# Patient Record
Sex: Female | Born: 1945 | Race: White | Hispanic: No | Smoking: Former smoker
Health system: Southern US, Community
[De-identification: ages and names within clinical notes are randomized; demographics above are authoritative.]

## PROBLEM LIST (undated history)

## (undated) DIAGNOSIS — J449 Chronic obstructive pulmonary disease, unspecified: Secondary | ICD-10-CM

## (undated) DIAGNOSIS — I2510 Atherosclerotic heart disease of native coronary artery without angina pectoris: Secondary | ICD-10-CM

## (undated) DIAGNOSIS — M1990 Unspecified osteoarthritis, unspecified site: Secondary | ICD-10-CM

## (undated) DIAGNOSIS — I10 Essential (primary) hypertension: Secondary | ICD-10-CM

## (undated) HISTORY — PX: TUBAL LIGATION: SHX77

## (undated) HISTORY — PX: ABDOMINAL HYSTERECTOMY: SHX81

## (undated) HISTORY — PX: TOTAL KNEE ARTHROPLASTY: SHX125

## (undated) HISTORY — PX: TONSILLECTOMY: SUR1361

## (undated) HISTORY — PX: BACK SURGERY: SHX140

## (undated) HISTORY — PX: FOOT SURGERY: SHX648

## (undated) HISTORY — PX: CHOLECYSTECTOMY: SHX55

---

## 1998-05-20 ENCOUNTER — Ambulatory Visit (HOSPITAL_COMMUNITY): Admission: RE | Admit: 1998-05-20 | Discharge: 1998-05-20 | Payer: Self-pay | Admitting: Cardiology

## 1998-09-04 ENCOUNTER — Other Ambulatory Visit: Admission: RE | Admit: 1998-09-04 | Discharge: 1998-09-04 | Payer: Self-pay | Admitting: Obstetrics and Gynecology

## 1998-11-10 ENCOUNTER — Ambulatory Visit (HOSPITAL_COMMUNITY): Admission: RE | Admit: 1998-11-10 | Discharge: 1998-11-10 | Payer: Self-pay | Admitting: Gastroenterology

## 2001-03-13 ENCOUNTER — Other Ambulatory Visit: Admission: RE | Admit: 2001-03-13 | Discharge: 2001-03-13 | Payer: Self-pay | Admitting: Obstetrics and Gynecology

## 2001-05-24 ENCOUNTER — Ambulatory Visit (HOSPITAL_COMMUNITY): Admission: RE | Admit: 2001-05-24 | Discharge: 2001-05-24 | Payer: Self-pay | Admitting: Unknown Physician Specialty

## 2001-06-08 ENCOUNTER — Encounter: Admission: RE | Admit: 2001-06-08 | Discharge: 2001-06-08 | Payer: Self-pay | Admitting: Neurosurgery

## 2001-06-08 ENCOUNTER — Encounter: Payer: Self-pay | Admitting: Neurosurgery

## 2001-07-02 ENCOUNTER — Encounter: Payer: Self-pay | Admitting: Neurosurgery

## 2001-07-02 ENCOUNTER — Encounter: Admission: RE | Admit: 2001-07-02 | Discharge: 2001-07-02 | Payer: Self-pay | Admitting: Neurosurgery

## 2001-07-20 ENCOUNTER — Encounter: Payer: Self-pay | Admitting: Neurosurgery

## 2001-07-20 ENCOUNTER — Encounter: Admission: RE | Admit: 2001-07-20 | Discharge: 2001-07-20 | Payer: Self-pay | Admitting: Neurosurgery

## 2001-07-30 ENCOUNTER — Inpatient Hospital Stay (HOSPITAL_COMMUNITY): Admission: EM | Admit: 2001-07-30 | Discharge: 2001-08-02 | Payer: Self-pay | Admitting: Emergency Medicine

## 2001-07-30 ENCOUNTER — Encounter: Payer: Self-pay | Admitting: Emergency Medicine

## 2001-07-31 ENCOUNTER — Encounter: Payer: Self-pay | Admitting: Cardiology

## 2001-08-14 ENCOUNTER — Encounter: Payer: Self-pay | Admitting: *Deleted

## 2001-08-14 ENCOUNTER — Emergency Department (HOSPITAL_COMMUNITY): Admission: EM | Admit: 2001-08-14 | Discharge: 2001-08-14 | Payer: Self-pay | Admitting: Emergency Medicine

## 2001-08-27 ENCOUNTER — Inpatient Hospital Stay (HOSPITAL_COMMUNITY): Admission: EM | Admit: 2001-08-27 | Discharge: 2001-08-31 | Payer: Self-pay

## 2002-03-21 ENCOUNTER — Other Ambulatory Visit: Admission: RE | Admit: 2002-03-21 | Discharge: 2002-03-21 | Payer: Self-pay | Admitting: Obstetrics and Gynecology

## 2003-04-17 ENCOUNTER — Other Ambulatory Visit: Admission: RE | Admit: 2003-04-17 | Discharge: 2003-04-17 | Payer: Self-pay | Admitting: Obstetrics and Gynecology

## 2004-02-27 ENCOUNTER — Ambulatory Visit (HOSPITAL_COMMUNITY): Admission: RE | Admit: 2004-02-27 | Discharge: 2004-02-27 | Payer: Self-pay | Admitting: Gastroenterology

## 2004-05-26 ENCOUNTER — Other Ambulatory Visit: Admission: RE | Admit: 2004-05-26 | Discharge: 2004-05-26 | Payer: Self-pay | Admitting: Obstetrics and Gynecology

## 2004-11-08 ENCOUNTER — Ambulatory Visit: Payer: Self-pay | Admitting: Family Medicine

## 2004-11-24 ENCOUNTER — Ambulatory Visit: Payer: Self-pay | Admitting: Family Medicine

## 2004-11-24 ENCOUNTER — Ambulatory Visit (HOSPITAL_COMMUNITY): Admission: RE | Admit: 2004-11-24 | Discharge: 2004-11-24 | Payer: Self-pay | Admitting: Family Medicine

## 2004-12-31 ENCOUNTER — Ambulatory Visit: Payer: Self-pay | Admitting: Family Medicine

## 2005-01-19 ENCOUNTER — Ambulatory Visit: Payer: Self-pay | Admitting: Family Medicine

## 2005-02-24 ENCOUNTER — Ambulatory Visit: Payer: Self-pay | Admitting: Family Medicine

## 2005-04-10 ENCOUNTER — Emergency Department (HOSPITAL_COMMUNITY): Admission: EM | Admit: 2005-04-10 | Discharge: 2005-04-10 | Payer: Self-pay | Admitting: Emergency Medicine

## 2005-04-12 ENCOUNTER — Ambulatory Visit: Payer: Self-pay | Admitting: Family Medicine

## 2005-04-13 ENCOUNTER — Ambulatory Visit (HOSPITAL_COMMUNITY): Admission: RE | Admit: 2005-04-13 | Discharge: 2005-04-13 | Payer: Self-pay | Admitting: Family Medicine

## 2005-05-03 ENCOUNTER — Inpatient Hospital Stay (HOSPITAL_COMMUNITY): Admission: RE | Admit: 2005-05-03 | Discharge: 2005-05-10 | Payer: Self-pay | Admitting: Neurosurgery

## 2005-05-03 ENCOUNTER — Ambulatory Visit: Payer: Self-pay | Admitting: Physical Medicine & Rehabilitation

## 2005-05-31 ENCOUNTER — Ambulatory Visit: Payer: Self-pay | Admitting: Family Medicine

## 2005-09-26 ENCOUNTER — Ambulatory Visit: Payer: Self-pay | Admitting: Family Medicine

## 2005-09-29 ENCOUNTER — Ambulatory Visit: Payer: Self-pay | Admitting: Family Medicine

## 2005-10-11 ENCOUNTER — Ambulatory Visit: Payer: Self-pay | Admitting: Family Medicine

## 2005-10-20 ENCOUNTER — Ambulatory Visit: Payer: Self-pay | Admitting: Family Medicine

## 2005-10-20 ENCOUNTER — Ambulatory Visit (HOSPITAL_COMMUNITY): Admission: RE | Admit: 2005-10-20 | Discharge: 2005-10-20 | Payer: Self-pay | Admitting: Family Medicine

## 2006-02-16 ENCOUNTER — Ambulatory Visit: Payer: Self-pay | Admitting: Family Medicine

## 2006-02-23 ENCOUNTER — Ambulatory Visit: Payer: Self-pay | Admitting: Family Medicine

## 2006-04-14 ENCOUNTER — Ambulatory Visit: Payer: Self-pay | Admitting: Family Medicine

## 2006-05-08 ENCOUNTER — Ambulatory Visit: Payer: Self-pay | Admitting: Family Medicine

## 2006-05-11 ENCOUNTER — Ambulatory Visit: Payer: Self-pay | Admitting: Family Medicine

## 2006-05-19 ENCOUNTER — Ambulatory Visit: Payer: Self-pay | Admitting: Family Medicine

## 2006-08-22 ENCOUNTER — Ambulatory Visit: Payer: Self-pay | Admitting: Family Medicine

## 2006-11-03 ENCOUNTER — Ambulatory Visit: Payer: Self-pay | Admitting: Family Medicine

## 2006-12-05 ENCOUNTER — Ambulatory Visit: Payer: Self-pay | Admitting: Family Medicine

## 2007-02-09 ENCOUNTER — Ambulatory Visit: Payer: Self-pay | Admitting: Family Medicine

## 2007-03-07 ENCOUNTER — Ambulatory Visit: Payer: Self-pay | Admitting: Family Medicine

## 2007-04-04 ENCOUNTER — Ambulatory Visit: Payer: Self-pay | Admitting: Family Medicine

## 2007-04-18 ENCOUNTER — Ambulatory Visit: Payer: Self-pay | Admitting: Family Medicine

## 2007-05-07 ENCOUNTER — Inpatient Hospital Stay (HOSPITAL_COMMUNITY): Admission: RE | Admit: 2007-05-07 | Discharge: 2007-05-09 | Payer: Self-pay | Admitting: Orthopedic Surgery

## 2007-05-30 ENCOUNTER — Encounter: Admission: RE | Admit: 2007-05-30 | Discharge: 2007-06-13 | Payer: Self-pay | Admitting: Orthopedic Surgery

## 2008-01-14 ENCOUNTER — Ambulatory Visit (HOSPITAL_COMMUNITY): Admission: RE | Admit: 2008-01-14 | Discharge: 2008-01-14 | Payer: Self-pay | Admitting: Neurosurgery

## 2008-04-22 ENCOUNTER — Inpatient Hospital Stay (HOSPITAL_COMMUNITY): Admission: RE | Admit: 2008-04-22 | Discharge: 2008-05-02 | Payer: Self-pay | Admitting: Neurosurgery

## 2008-05-14 ENCOUNTER — Ambulatory Visit (HOSPITAL_COMMUNITY): Admission: RE | Admit: 2008-05-14 | Discharge: 2008-05-14 | Payer: Self-pay | Admitting: Neurosurgery

## 2008-06-09 ENCOUNTER — Encounter (HOSPITAL_COMMUNITY): Admission: RE | Admit: 2008-06-09 | Discharge: 2008-07-09 | Payer: Self-pay | Admitting: Neurosurgery

## 2008-06-17 ENCOUNTER — Encounter: Admission: RE | Admit: 2008-06-17 | Discharge: 2008-06-17 | Payer: Self-pay | Admitting: Neurosurgery

## 2008-07-03 ENCOUNTER — Encounter: Admission: RE | Admit: 2008-07-03 | Discharge: 2008-07-03 | Payer: Self-pay | Admitting: Neurosurgery

## 2008-07-11 ENCOUNTER — Encounter (HOSPITAL_COMMUNITY): Admission: RE | Admit: 2008-07-11 | Discharge: 2008-08-10 | Payer: Self-pay | Admitting: Neurosurgery

## 2008-07-22 ENCOUNTER — Encounter: Admission: RE | Admit: 2008-07-22 | Discharge: 2008-07-22 | Payer: Self-pay | Admitting: Neurosurgery

## 2008-10-01 ENCOUNTER — Encounter: Admission: RE | Admit: 2008-10-01 | Discharge: 2008-10-01 | Payer: Self-pay | Admitting: Neurosurgery

## 2009-01-27 ENCOUNTER — Encounter: Admission: RE | Admit: 2009-01-27 | Discharge: 2009-01-27 | Payer: Self-pay | Admitting: Neurosurgery

## 2009-02-10 ENCOUNTER — Encounter: Admission: RE | Admit: 2009-02-10 | Discharge: 2009-02-10 | Payer: Self-pay | Admitting: Neurosurgery

## 2009-02-16 ENCOUNTER — Ambulatory Visit (HOSPITAL_COMMUNITY): Admission: RE | Admit: 2009-02-16 | Discharge: 2009-02-16 | Payer: Self-pay | Admitting: Ophthalmology

## 2009-05-04 ENCOUNTER — Ambulatory Visit (HOSPITAL_COMMUNITY): Admission: RE | Admit: 2009-05-04 | Discharge: 2009-05-04 | Payer: Self-pay | Admitting: Ophthalmology

## 2009-10-30 ENCOUNTER — Encounter: Admission: RE | Admit: 2009-10-30 | Discharge: 2009-10-30 | Payer: Self-pay | Admitting: Neurosurgery

## 2010-11-15 ENCOUNTER — Encounter: Admission: RE | Admit: 2010-11-15 | Discharge: 2010-11-15 | Payer: Self-pay | Admitting: Neurosurgery

## 2010-12-15 ENCOUNTER — Encounter
Admission: RE | Admit: 2010-12-15 | Discharge: 2010-12-15 | Payer: Self-pay | Source: Home / Self Care | Attending: Neurosurgery | Admitting: Neurosurgery

## 2010-12-19 HISTORY — PX: CARDIAC CATHETERIZATION: SHX172

## 2010-12-30 ENCOUNTER — Encounter
Admission: RE | Admit: 2010-12-30 | Discharge: 2010-12-30 | Payer: Self-pay | Source: Home / Self Care | Attending: Neurosurgery | Admitting: Neurosurgery

## 2011-03-29 LAB — GLUCOSE, CAPILLARY

## 2011-03-29 LAB — BASIC METABOLIC PANEL
Chloride: 99 mEq/L (ref 96–112)
Creatinine, Ser: 0.71 mg/dL (ref 0.4–1.2)
GFR calc Af Amer: >60 mL/min (ref >60)
Potassium: 4.6 mEq/L (ref 3.5–5.1)
Sodium: 136 mEq/L (ref 135–145)

## 2011-03-29 LAB — HEMOGLOBIN AND HEMATOCRIT, BLOOD
HCT: 34 % — ABNORMAL LOW (ref 36.0–46.0)
Hemoglobin: 11.7 g/dL — ABNORMAL LOW (ref 12.0–15.0)

## 2011-03-31 LAB — GLUCOSE, CAPILLARY: Glucose-Capillary: 150 mg/dL — ABNORMAL HIGH (ref 70–99)

## 2011-04-05 LAB — BASIC METABOLIC PANEL
Calcium: 9.5 mg/dL (ref 8.4–10.5)
GFR calc non Af Amer: >60 mL/min (ref >60)
Glucose, Bld: 258 mg/dL — ABNORMAL HIGH (ref 70–99)
Sodium: 138 mEq/L (ref 135–145)

## 2011-05-03 NOTE — H&P (Signed)
NAME:  Angelica [name], Angelica [name] NO.:  [id number]   MEDICAL RECORD NO.:  [id number]          PATIENT TYPE:  INP   LOCATION:  3172                         FACILITY:  [facility]   PHYSICIAN:  Hilda Lias, M.D.   DATE OF BIRTH:  Jan 23, 1946   DATE OF ADMISSION:  04/22/2008  DATE OF DISCHARGE:                              HISTORY & PHYSICAL   HISTORY:  Ms. Wohlfarth is a lady who had been complaining of back pain  radiating to the lower extremities which has been going on for several  months.  The patient is getting worse.  The patient had conservative  treatment including epidural injection without any improvement.  She,  about 3 years ago, underwent fusion of L5-S1.  Right now, the pain is  getting up to the point that she has difficulty driving.   PAST MEDICAL HISTORY:  Hysterectomy, cholecystectomy, tubal ligation,  and L5-S1 fusion.   SOCIAL HISTORY:  The patient smokes and drinks.   FAMILY HISTORY:  Unremarkable.   REVIEW OF SYSTEMS:  Positive for low back pain, bilateral leg pain, left  worse than the right.   PHYSICAL EXAMINATION:  HEAD, EARS, NOSE, AND THROAT: Normal.  NECK: Normal.  LUNGS: Clear.  HEART: Normal.  ABDOMEN: Normal.  Carotids, normal pulses.  MUSCULOSKELETAL: She has a well-healed scar in the lumbar area.  She has  a decreased flexion of the lumbar spine.  Straight leg raising is  positive bilaterally about 60 degrees. The x-rays show fusion of L5-S1.  The myelogram showed that she has stenosis at the L4-L5 with a grade 1  spondylolisthesis.   IMPRESSION:  L4-S1 spondylolisthesis with stenosis.  Status post fusion  of L5-S1.   RECOMMENDATIONS:  The patient being admitted for surgery.  Procedure  will be bilateral laminectomy at L4, foraminotomy with cages and pedicle  screws.  The surgery was fully explained to her and essentially the same  one she had 3-1/2 years ago.  The risk of course are infection, CSF  leak, no improvement whatsoever, and  need of further surgery.           ______________________________  Hilda Lias, M.D.     EB/MEDQ  D:  04/22/2008  T:  04/22/2008  Job:  [id number]

## 2011-05-03 NOTE — Discharge Summary (Signed)
NAME:  Angelica [name], Angelica [name] NO.:  [id number]   MEDICAL RECORD NO.:  [id number]          PATIENT TYPE:  INP   LOCATION:  3035                         FACILITY:  [facility]   PHYSICIAN:  Hilda Lias, M.D.   DATE OF BIRTH:  Aug 28, 1946   DATE OF ADMISSION:  04/22/2008  DATE OF DISCHARGE:  05/02/2008                               DISCHARGE SUMMARY   ADMISSION DIAGNOSES:  Lumbar stenosis L4-L5 with bilateral  radiculopathy, neurogenic claudication, status post fusion L5-S1.   FINAL DIAGNOSES:  Lumbar stenosis L4-L5 with bilateral radiculopathy,  neurogenic claudication, status post fusion L5-S1.   CLINICAL HISTORY:  The patient was admitted because of back pain  radiating to both legs.  The patient is quite obese.  Previously, she  has had a fusion of L5-S1 and she did well.  This one showed that she  had stenosis and surgery was advised.  Laboratory normal.   COURSE IN THE HOSPITAL:  The patient was taken to the surgery and a L4-  L5 diskectomy and fusion was done.  The patient did well, but later on,  she started complaining of off and on some burning pain going to the  left leg, not to the right one.  Medication did not help.  We did an x-  ray of the lumbar spine, which was normal and a CT scan showed that  there is a possibility of some fragmented bone into the foramina.  We  gave the choice to the patient about waiting or to proceed with surgery.  At the end, we agreed with surgery and we took her to surgery.  We did  not find anything major except inflammation of the L5 nerve root.  The  canal was open and there was no any nerve root or any other bone  compromising the nerve root.  Today, she is feeling better, although she  tells the pain is more tolerable than prior to surgery.  She is going to  be sent home today to follow up by me in my office.   CONDITION ON DISCHARGE:  Improving.   MEDICATIONS:  1. Percocet.  2. Diazepam.  3. Cortisone.  4. Neurontin.   DIET:  She will continue her diabetic diet and try to lose weight.   ACTIVITY:  Not to the right for at least two weeks.   FOLLOWUP:  She will be seen by me in 4 weeks.           ______________________________  Hilda Lias, M.D.    EB/MEDQ  D:  05/02/2008  T:  05/02/2008  Job:  [id number]

## 2011-05-03 NOTE — Op Note (Signed)
NAME:  Angelica [name], Angelica [name] NO.:  [id number]   MEDICAL RECORD NO.:  [id number]          PATIENT TYPE:  INP   LOCATION:  3035                         FACILITY:  [facility]   PHYSICIAN:  Hilda Lias, M.D.   DATE OF BIRTH:  1946-02-13   DATE OF PROCEDURE:  04/22/2008  DATE OF DISCHARGE:                               OPERATIVE REPORT   PREOPERATIVE DIAGNOSES:  Lumbar stenosis L4-L5 with bilateral  radiculopathy, neurogenic claudication, status post fusion L5-S1.   POSTOPERATIVE DIAGNOSES:  Lumbar stenosis L4-L5 with bilateral  radiculopathy, neurogenic claudication, status post fusion L5-S1.   PROCEDURES:  1. Bilateral L4 laminectomy and facetectomy.  2. Bilateral total gross L4-L5 diskectomy.  3. Interbody fusion with cages.  4. Pedicle screws L4-L5.  5. Posterolateral arthrodesis with BMP and Vitoss.   SURGEON:  Hilda Lias, MD   CLINICAL HISTORY:  Ms. Arambula is a lady who in the past underwent fusion  of the L5-S1.  The patient did well, but lately she had been complaining  of back pain radiating to both legs, left worse than the right.  X-rays  showed that she had severe stenosis at the level of L4-L5.  The pedicle  screws at the level of S1 on the left side was broken.  Nevertheless,  flexion extension showed good alignment.  Surgery was advised.   PROCEDURE:  The patient was taken to the OR and she was positioned in a  prone manner. Skin was prepped with DuraPrep.  Drapes were applied, and  midline incision from the previous one was made.  We went through a  thick adipose tissue layer down to the muscle.  We identified the  pedicles of L5 and S1 and retraction was made laterally.  Then, the caps  from the screws were removed as well as the rod.  We left in place the  pedicle of L5 removing the S1 on the right side and the bulk of the one  on the left side.  Nevertheless, we tried to attempt to remove the rest  of the screw, but we were afraid there would be  the risk for damage, and  since there was no communication whatsoever with changes in the canal of  the foramen, it was left in place.  Then, we proceeded with the removal  of spinous process at L4 as well as the lamina and the facet.  The  patient had quite a bit of adhesions, and lysis was accomplished.  We  retracted the thecal sac, and we got into the disk space.  Bilateral  total gross diskectomy was achieved.  Then, two cages of 10 x 22 with  autograft and BMP were introduced.  This was followed using two screws  at the level of L4 using the C-arm.  Good position of the screw was  achieved, and then connection between the L4 and L5 screw was done using  a rod and caps.  Compression was done to prevent any migration of the  cages.  Then, we went laterally, and we drilled the lateral aspect of  the facet L4-L5 as well as  the proximal transverse  process.  A mix of autograft and BMP as well as Vitoss was used for  arthrodesis.  We investigated the foramen.  There was thin interspace of  the L4 and L5 nerve root.  Valsalva maneuver was negative.  Then, the  wound was closed with Vicryl and Steri-Strips.           ______________________________  Hilda Lias, M.D.     EB/MEDQ  D:  04/22/2008  T:  04/23/2008  Job:  [id number]

## 2011-05-06 NOTE — Consult Note (Signed)
NAME:  Angelica [name], Angelica [name] NO.:  [id number]   MEDICAL RECORD NO.:  [id number]          PATIENT TYPE:  INP   LOCATION:  3011                         FACILITY:  [facility]   PHYSICIAN:  Altha Harm, MDDATE OF BIRTH:  31-Oct-1946   DATE OF CONSULTATION:  05/06/2005  DATE OF DISCHARGE:                                   CONSULTATION   CHIEF COMPLAINT:  Consult for management of diabetes and asthma.   HISTORY OF PRESENT ILLNESS:  This is a 65 year old Caucasian female who is  status post bilateral L5 laminectomy, fasciotomy, and total diskectomy on  Apr 28, 2005.  Patient has a long-standing history of hypertension, asthma,  obesity, and recent diagnosis of diabetes type 2.  Patient denies any visual  changes.  She denies any chest pain.  She denies any headaches, any  dysesthesias.  Patient only complains of back pain which she states varies  between 3-7/10 depending on coincidence with her pain medications.   PAST MEDICAL HISTORY:  1.  Diabetes type 2.  2.  Hypertension.  3.  Asthma.  4.  Obesity.  5.  Osteoarthritis.  6.  Hypercholesterolemia.  7.  Rule out history of thrombocytopenia associated with quinine use.   PAST SURGICAL HISTORY:  Negative except for the surgery on this admission.   SOCIAL HISTORY:  Patient smokes two packs per day x30 years.  She is a  Runner, broadcasting/film/video.  She denies any alcohol or drug use.   FAMILY HISTORY:  Significant for coronary artery disease and lung cancer.   ALLERGIES:  ROCEPHIN, PENICILLIN which causes a rash, MORPHINE which causes  a rash, allergy unknown.   CURRENT MEDICATIONS AT HOME:  1.  Percocet 5/325 q.4h. p.r.n.  2.  Diovan 80 mg daily.  3.  Amaryl 3 mg daily.  4.  Celebrex 200 mg daily.  5.  Singulair 10 mg daily.  6.  Neurontin 300 mg t.i.d.  7.  Triglide 160 mg daily.  8.  Nabumetone 1000 mg h.s.  9.  Nexium 40 mg daily.  10. Triamterene/hydrochlorothiazide 37.5/25 daily.  11. Premarin 7.25 mg daily.  12.  Albuterol inhaler two puffs q.4h. p.r.n.  13. Advair 250/50 one puff b.i.d.   REVIEW OF SYSTEMS:  All systems are negative except as noted in the HPI.   LABORATORIES:  May 11:  Sodium 133, potassium 3.3, chloride 96, bicarbonate  29, BUN 9, creatinine 0.9.  Patient had a white blood cell count of 6.6,  hemoglobin 13, hematocrit 37.9, platelets 535.   PHYSICAL EXAMINATION:  GENERAL:  This patient is lying in bed somewhat  confused secondary to her pain medication use which was recently  administered to her.  Patient wanes between sleep and awake.  Her daughter  is in the room and gave most of the history.  The patient appears to be in  no distress at this time.  Her CBGs recorded is 149 ___________.  No other  CBGs recorded.  VITAL SIGNS:  Temperature 97, heart rate 105, respiratory rate 16, blood  pressure 108/73.  She is 92% on room air.  HEENT:  Patient is normocephalic, atraumatic.  Pupils are equal, round, and  reactive to light and accommodation.  Extraocular movements are intact.  Fundi are benign.  There appears to be no hemorrhages or any other  abnormalities noted.  Tympanic membranes are translucent bilaterally.  Good  landmarks.  External auditory canal shows no discharge and no cerumen.  Nasal mucosa is moist.  No polyps noted.  Oropharynx is moist.  No lesions,  exudate, or erythema is noted.  NECK:  Supple.  Trachea is midline.  No masses.  No thyromegaly noted.  CHEST:  Clear to auscultation.  Patient has normal respiratory effort  without any accessory muscle use.  There is no wheezing or rhonchi noted.  No increased work or fremitus.  CARDIOVASCULAR:  She has a normal S1 and S2.  PMI is nondisplaced.  No  murmurs, rubs, or gallops are noted.  No heaves or thrills on palpation.  ABDOMEN:  Obese, soft, nontender, nondistended.  No masses.  No  hepatosplenomegaly.  She has normoactive bowel sounds.  LYMPHATIC:  She has no cervical, axillary, inguinal lymphadenopathy.   MUSCULOSKELETAL:  Patient moves both extremities above gravity.  Patient is  able to ambulate from the bed to the bathroom with standby assistance only.  However, patient refused formal musculoskeletal examination of strength.  PSYCHIATRIC:  Patient appears to have normal affect.  When patient is awake  she is able to perform serial 7's without any difficulty and give history.  However, the patient falls off to sleep very quickly.  NEUROLOGIC:  Examination deferred to neurosurgery.   ASSESSMENT/PLAN:  1.  History of diabetes type 2.  I will resume the patient's Amaryl and      check her blood sugars.  I will also check a hemoglobin A1C on this      patient at this time.  2.  Hypertension.  It appears to be stable.  Will resume her Diovan and      triamterene/hydrochlorothiazide.  3.  Hypercholesterolemia.  Resume her Triglide.  4.  History of asthma.  Patient will be placed on Singulair and Advair.  At      this time there is no need for albuterol as the patient appears to have      no acute exacerbation of her symptoms.  5.  History of gastroesophageal reflux disease.  She will be placed on her      Nexium.  6.  In terms of neuropathic pain, currently, the patient does not have any      neuropathic pain and this may have been      remedied with her surgery.  I will defer for restarting of gabapentin to      her neurosurgeon.  We will be happy to follow along with the care of      this patient.   Thank you for referring this patient.      MAM/MEDQ  D:  05/06/2005  T:  05/07/2005  Job:  [id number]

## 2011-05-06 NOTE — Cardiovascular Report (Signed)
[place]. [facility]  Patient:    Angelica [name], Angelica [name]                      MRN: [id number] Proc. Date: 08/01/01 Adm. Date:  [id number] Attending:  Eleanora Neighbor CC:         Dr. Dewaine Conger, [place]   Cardiac Catheterization  HISTORY:  The patient is a 65 year old female with a history of obesity, positive family history of heart disease, diabetes, hypertension, cigarette abuse, and hypercholesterolemia, and also a history of asthma.  She presents with substernal chest pain.  Initially myocardial infarction was ruled out. She then developed a fever.  Cultures were negative.  She was started on antibiotics.  On the day of the procedure, she developed a rash.  Because of the need to further differentiate etiology of the chest pain syndrome that she has, as well as multiple risk factors, she was referred for catheterization. She did develop loose stools on the day before the procedure.  PROCEDURE:  Left heart catheterization with selective coronary angiography, left ventricular angiography.  TYPE AND SITE OF ENTRY:  Percutaneous right femoral artery (femoral arteriograms demonstrated the right femoral profunda artery as the entry site).  MEDICATIONS GIVEN PRIOR TO THE PROCEDURE:  Valium 10 mg p.o.  MEDICATIONS GIVEN DURING THE PROCEDURE:  Versed 2 mg IV, Benadryl 25 mg IV.  COMMENTS:  The patient tolerated the procedure well.  HEMODYNAMIC DATA:  The aortic pressure was 100/20,  LV was 99/40.  There was no aortic valve gradient noted on pullback.  ANGIOGRAPHIC DATA: 1. Left main coronary artery:  Normal. 2. Left anterior descending:  Left anterior descending has a high diagonal    vessel.  It extends to the apex.  There is 30-40% narrowing in the mid    LAD.  There is some tortuosity of the vessel at the distal two thirds. 3. Left circumflex:  The left circumflex is a large obtuse marginal.  It is    essentially normal. 4. Right coronary artery:  The  right coronary artery is a moderately    large dominant vessel.  There is 30% narrowing in the midportion of the    vessel.  There is excellent distal flow.  There is some scattered    irregularities more proximal to this narrowing but distal vessels are    all satisfactory.  LEFT VENTRICULOGRAPHY:  Left ventricular angiogram was performed in the RAO position.  Overall cardiac size and silhouette were normal.  Left ventricular function is normal.  Femoral arteriogram demonstrates the entry site into the profunda femoris.  OVERALL IMPRESSION: 1. Essentially normal left ventricular function. 2. Mild two-vessel coronary atherosclerosis.  DISCUSSION:  It is felt that the patients current problem is not related to ischemic heart disease.  She has a multitude of cardiovascular risk factors which need to be modified for her to do well from a cardiovascular standpoint. D:  08/01/01 TD:  08/01/01 Job: [id number]

## 2011-09-13 LAB — BASIC METABOLIC PANEL
CO2: 28
Calcium: 9.7
Creatinine, Ser: 0.58
GFR calc Af Amer: >60
Glucose, Bld: 141 — ABNORMAL HIGH

## 2011-09-13 LAB — CBC
MCHC: 33.9
RBC: 4.45
RDW: 13.4

## 2011-09-13 LAB — TYPE AND SCREEN
ABO/RH(D): O POS
Antibody Screen: NEGATIVE

## 2011-11-27 ENCOUNTER — Emergency Department (HOSPITAL_COMMUNITY)
Admission: EM | Admit: 2011-11-27 | Discharge: 2011-11-28 | Disposition: A | Discharge status: Home / Self Care | Payer: Medicare Other | Attending: Emergency Medicine | Admitting: Emergency Medicine

## 2011-11-27 ENCOUNTER — Emergency Department (HOSPITAL_COMMUNITY): Payer: Medicare Other

## 2011-11-27 DIAGNOSIS — J40 Bronchitis, not specified as acute or chronic: Secondary | ICD-10-CM

## 2011-11-27 DIAGNOSIS — J4489 Other specified chronic obstructive pulmonary disease: Secondary | ICD-10-CM | POA: Insufficient documentation

## 2011-11-27 DIAGNOSIS — B349 Viral infection, unspecified: Secondary | ICD-10-CM

## 2011-11-27 DIAGNOSIS — J449 Chronic obstructive pulmonary disease, unspecified: Secondary | ICD-10-CM

## 2011-11-27 DIAGNOSIS — M129 Arthropathy, unspecified: Secondary | ICD-10-CM | POA: Insufficient documentation

## 2011-11-27 DIAGNOSIS — F17200 Nicotine dependence, unspecified, uncomplicated: Secondary | ICD-10-CM | POA: Insufficient documentation

## 2011-11-27 DIAGNOSIS — B9789 Other viral agents as the cause of diseases classified elsewhere: Secondary | ICD-10-CM | POA: Insufficient documentation

## 2011-11-27 DIAGNOSIS — E119 Type 2 diabetes mellitus without complications: Secondary | ICD-10-CM | POA: Insufficient documentation

## 2011-11-27 DIAGNOSIS — I10 Essential (primary) hypertension: Secondary | ICD-10-CM | POA: Insufficient documentation

## 2011-11-27 HISTORY — DX: Essential (primary) hypertension: I10

## 2011-11-27 HISTORY — DX: Chronic obstructive pulmonary disease, unspecified: J44.9

## 2011-11-27 HISTORY — DX: Unspecified osteoarthritis, unspecified site: M19.90

## 2011-11-27 MED ORDER — IPRATROPIUM BROMIDE 0.02 % IN SOLN
RESPIRATORY_TRACT | Status: AC
  Administered 2011-11-27: 0.5 mg
  Filled 2011-11-27: qty 2.5

## 2011-11-27 MED ORDER — SODIUM CHLORIDE 0.9 % IV BOLUS (SEPSIS)
1000.0000 mL | Freq: Once | INTRAVENOUS | Status: AC
  Administered 2011-11-27: 1000 mL via INTRAVENOUS

## 2011-11-27 MED ORDER — PREDNISONE 20 MG PO TABS
60.0000 mg | ORAL_TABLET | Freq: Once | ORAL | Status: AC
Start: 2011-11-27 — End: 2011-11-27
  Administered 2011-11-27: 60 mg via ORAL
  Filled 2011-11-27: qty 3

## 2011-11-27 MED ORDER — ALBUTEROL SULFATE (5 MG/ML) 0.5% IN NEBU
INHALATION_SOLUTION | RESPIRATORY_TRACT | Status: AC
  Administered 2011-11-27: 5 mg
  Filled 2011-11-27: qty 1

## 2011-11-27 NOTE — ED Notes (Signed)
Pt presents with cough, chills, fever, and diarrhea x 1 week. Pt states she was seen by PMD and was diagnosed with bronchitis. Treatments have not helped.

## 2011-11-28 LAB — CBC
MCH: 26.9 pg (ref 26.0–34.0)
MCHC: 31.8 g/dL (ref 30.0–36.0)
Platelets: 358 10*3/uL (ref 150–400)
RDW: 15.1 % (ref 11.5–15.5)

## 2011-11-28 LAB — COMPREHENSIVE METABOLIC PANEL
Albumin: 3.4 g/dL — ABNORMAL LOW (ref 3.5–5.2)
BUN: 13 mg/dL (ref 6–23)
Creatinine, Ser: 0.67 mg/dL (ref 0.50–1.10)
GFR calc Af Amer: >90 mL/min (ref >90)
Total Bilirubin: 0.3 mg/dL (ref 0.3–1.2)
Total Protein: 7.2 g/dL (ref 6.0–8.3)

## 2011-11-28 LAB — URINALYSIS, ROUTINE W REFLEX MICROSCOPIC
Ketones, ur: NEGATIVE mg/dL
Leukocytes, UA: NEGATIVE
Nitrite: NEGATIVE

## 2011-11-28 LAB — URINE MICROSCOPIC-ADD ON

## 2011-11-28 LAB — LIPASE, BLOOD: Lipase: 10 U/L — ABNORMAL LOW (ref 11–59)

## 2011-11-28 MED ORDER — PREDNISONE 10 MG PO TABS: 60.0000 mg | ORAL_TABLET | Freq: Every day | ORAL | Status: DC

## 2011-11-28 MED ORDER — ALBUTEROL SULFATE (5 MG/ML) 0.5% IN NEBU
5.0000 mg | INHALATION_SOLUTION | Freq: Once | RESPIRATORY_TRACT | Status: AC
  Administered 2011-11-28: 5 mg via RESPIRATORY_TRACT
  Filled 2011-11-28: qty 1

## 2011-11-28 MED ORDER — IPRATROPIUM BROMIDE 0.02 % IN SOLN
0.5000 mg | Freq: Once | RESPIRATORY_TRACT | Status: AC
  Administered 2011-11-28: 0.5 mg via RESPIRATORY_TRACT
  Filled 2011-11-28: qty 2.5

## 2011-11-28 NOTE — ED Provider Notes (Signed)
History     CSN: [id number] Arrival date & time: 11/27/2011 10:19 PM   First MD Initiated Contact with Patient 11/27/11 2307      Chief Complaint  Patient presents with  . Cough  . Chills  . Diarrhea  . Nausea     HPI Patient reports approximately one week of bronchitis coughing congestion not improved by azithromycin.  She also reports development of chills fever and diarrhea.  She reports no improvement with her albuterol at home.  Her symptoms are not worsened by anything they're not improved by anything.  She denies chest pain and abdominal pain.  Her symptoms are constant.  They're mild to moderate.  She received albuterol and Atrovent on arrival of emergency department reports that her breathing is much better at this time.  She denies nausea and vomiting  Past Medical History  Diagnosis Date  . Diabetes mellitus   . Hypertension   . COPD (chronic obstructive pulmonary disease)   . Asthma   . Arthritis     Past Surgical History  Procedure Date  . Cesarean section   . Abdominal hysterectomy   . Total knee arthroplasty   . Back surgery   . Tonsillectomy   . Foot surgery   . Tubal ligation   . Cholecystectomy     History reviewed. No pertinent family history.  History  Substance Use Topics  . Smoking status: Current Everyday Smoker -- 1.0 packs/day    Types: Cigarettes  . Smokeless tobacco: Not on file  . Alcohol Use: No    OB History    Grav Para Term Preterm Abortions TAB SAB Ect Mult Living                  Review of Systems  All other systems reviewed and are negative.    Allergies  Quinine derivatives; Morphine and related; and Rocephin  Home Medications  No current outpatient prescriptions on file.  BP 159/71  Pulse 97  Temp(Src) 98.3 F (36.8 C) (Oral)  Resp 20  Ht 5\' 1"  (1.549 m)  Wt 198 lb (89.812 kg)  BMI 37.41 kg/m2  SpO2 96%  Physical Exam  Nursing note and vitals reviewed. Constitutional: She is oriented to person, place,  and time. She appears well-developed and well-nourished. No distress.  HENT:  Head: Normocephalic and atraumatic.  Eyes: EOM are normal.  Neck: Normal range of motion.  Cardiovascular: Normal rate, regular rhythm and normal heart sounds.   Pulmonary/Chest: Effort normal. She has wheezes.  Abdominal: Soft. She exhibits no distension. There is no tenderness.  Musculoskeletal: Normal range of motion.  Neurological: She is alert and oriented to person, place, and time.  Skin: Skin is warm and dry.  Psychiatric: She has a normal mood and affect. Judgment normal.    ED Course  Procedures (including critical care time)  Labs Reviewed  CBC - Abnormal; Notable for the following:    Hemoglobin 11.1 (*)    HCT 34.9 (*)    All other components within normal limits  URINALYSIS, ROUTINE W REFLEX MICROSCOPIC - Abnormal; Notable for the following:    Hgb urine dipstick TRACE (*)    Protein, ur TRACE (*)    All other components within normal limits  COMPREHENSIVE METABOLIC PANEL - Abnormal; Notable for the following:    Sodium 132 (*)    Chloride 91 (*)    CO2 33 (*)    Glucose, Bld 228 (*)    Albumin 3.4 (*)  All other components within normal limits  LIPASE, BLOOD - Abnormal; Notable for the following:    Lipase 10 (*)    All other components within normal limits  URINE MICROSCOPIC-ADD ON - Abnormal; Notable for the following:    Squamous Epithelial / LPF FEW (*)    All other components within normal limits  TROPONIN I   Dg Chest 2 View  11/28/2011  *RADIOLOGY REPORT*  Clinical Data: Cough, fever, chills, and diarrhea for 1 week.  CHEST - 2 VIEW  Comparison: 04/16/2008  Findings: The heart size and pulmonary vascularity are normal. The lungs appear clear and expanded without focal air space disease or consolidation. No blunting of the costophrenic angles. Calcification of the aorta.  Degenerative changes in the thoracic spine.  Anterior wedging of a lower thoracic vertebra, likely T12,  slightly progressed since the previous study.  IMPRESSION: No evidence of active pulmonary disease.  Mild progression of compression of T12 vertebra.  Original Report Authenticated By: Marlon Pel, M.D.   Personally reviewed the x-ray  1. Bronchitis 2. Viral syndrome 3. COPD exacerbation   MDM  Patient with viral-like symptoms as well as bronchitis.  Her chest x-ray is normal.  She feels much better after albuterol and Atrovent.  She was discharged home on a five-day burst of prednisone.  She has followup with her primary care Dr. tomorrow.        Lyanne Co, MD 11/28/11 [id number]

## 2012-03-02 ENCOUNTER — Emergency Department (HOSPITAL_COMMUNITY): Payer: Medicare Other

## 2012-03-02 ENCOUNTER — Other Ambulatory Visit: Payer: Self-pay

## 2012-03-02 ENCOUNTER — Inpatient Hospital Stay (HOSPITAL_COMMUNITY)
Admission: EM | Admit: 2012-03-02 | Discharge: 2012-03-05 | DRG: 190 | Disposition: A | Discharge status: Home / Self Care | Payer: Medicare Other | Attending: Internal Medicine | Admitting: Internal Medicine

## 2012-03-02 ENCOUNTER — Encounter (HOSPITAL_COMMUNITY): Payer: Self-pay | Admitting: *Deleted

## 2012-03-02 DIAGNOSIS — M129 Arthropathy, unspecified: Secondary | ICD-10-CM | POA: Diagnosis present

## 2012-03-02 DIAGNOSIS — D649 Anemia, unspecified: Secondary | ICD-10-CM | POA: Diagnosis present

## 2012-03-02 DIAGNOSIS — Z79899 Other long term (current) drug therapy: Secondary | ICD-10-CM

## 2012-03-02 DIAGNOSIS — D72829 Elevated white blood cell count, unspecified: Secondary | ICD-10-CM | POA: Diagnosis present

## 2012-03-02 DIAGNOSIS — T380X5A Adverse effect of glucocorticoids and synthetic analogues, initial encounter: Secondary | ICD-10-CM | POA: Diagnosis present

## 2012-03-02 DIAGNOSIS — J440 Chronic obstructive pulmonary disease with acute lower respiratory infection: Principal | ICD-10-CM | POA: Diagnosis present

## 2012-03-02 DIAGNOSIS — Z96659 Presence of unspecified artificial knee joint: Secondary | ICD-10-CM

## 2012-03-02 DIAGNOSIS — J9600 Acute respiratory failure, unspecified whether with hypoxia or hypercapnia: Secondary | ICD-10-CM | POA: Diagnosis present

## 2012-03-02 DIAGNOSIS — J209 Acute bronchitis, unspecified: Principal | ICD-10-CM | POA: Diagnosis present

## 2012-03-02 DIAGNOSIS — E119 Type 2 diabetes mellitus without complications: Secondary | ICD-10-CM | POA: Diagnosis present

## 2012-03-02 DIAGNOSIS — F17200 Nicotine dependence, unspecified, uncomplicated: Secondary | ICD-10-CM | POA: Diagnosis present

## 2012-03-02 DIAGNOSIS — I10 Essential (primary) hypertension: Secondary | ICD-10-CM | POA: Diagnosis present

## 2012-03-02 DIAGNOSIS — J441 Chronic obstructive pulmonary disease with (acute) exacerbation: Secondary | ICD-10-CM | POA: Diagnosis present

## 2012-03-02 DIAGNOSIS — E871 Hypo-osmolality and hyponatremia: Secondary | ICD-10-CM | POA: Diagnosis present

## 2012-03-02 LAB — DIFFERENTIAL
Basophils Relative: 0 % (ref 0–1)
Eosinophils Absolute: 0 10*3/uL (ref 0.0–0.7)
Eosinophils Relative: 0 % (ref 0–5)
Lymphs Abs: 1.4 10*3/uL (ref 0.7–4.0)
Monocytes Relative: 7 % (ref 3–12)

## 2012-03-02 LAB — CBC
Hemoglobin: 10.5 g/dL — ABNORMAL LOW (ref 12.0–15.0)
MCH: 25.7 pg — ABNORMAL LOW (ref 26.0–34.0)
MCHC: 31.3 g/dL (ref 30.0–36.0)
MCV: 82.2 fL (ref 78.0–100.0)
RBC: 4.09 MIL/uL (ref 3.87–5.11)

## 2012-03-02 LAB — BASIC METABOLIC PANEL
BUN: 15 mg/dL (ref 6–23)
Calcium: 10.4 mg/dL (ref 8.4–10.5)
Creatinine, Ser: 0.68 mg/dL (ref 0.50–1.10)
GFR calc non Af Amer: 89 mL/min — ABNORMAL LOW (ref >90)
Glucose, Bld: 134 mg/dL — ABNORMAL HIGH (ref 70–99)

## 2012-03-02 MED ORDER — IRBESARTAN 150 MG PO TABS
150.0000 mg | ORAL_TABLET | Freq: Every day | ORAL | Status: DC
  Administered 2012-03-03 – 2012-03-05 (×3): 150 mg via ORAL
  Filled 2012-03-02 (×3): qty 1

## 2012-03-02 MED ORDER — ACETAMINOPHEN 650 MG RE SUPP: 650.0000 mg | Freq: Four times a day (QID) | RECTAL | Status: DC | PRN

## 2012-03-02 MED ORDER — ALBUTEROL SULFATE (5 MG/ML) 0.5% IN NEBU
2.5000 mg | INHALATION_SOLUTION | RESPIRATORY_TRACT | Status: DC
  Administered 2012-03-02 – 2012-03-05 (×13): 2.5 mg via RESPIRATORY_TRACT
  Filled 2012-03-02 (×13): qty 0.5

## 2012-03-02 MED ORDER — DOCUSATE SODIUM 100 MG PO CAPS
100.0000 mg | ORAL_CAPSULE | Freq: Two times a day (BID) | ORAL | Status: DC
  Administered 2012-03-02 – 2012-03-05 (×6): 100 mg via ORAL
  Filled 2012-03-02 (×6): qty 1

## 2012-03-02 MED ORDER — PANTOPRAZOLE SODIUM 40 MG PO TBEC
40.0000 mg | DELAYED_RELEASE_TABLET | Freq: Two times a day (BID) | ORAL | Status: DC
  Administered 2012-03-03 – 2012-03-05 (×5): 40 mg via ORAL
  Filled 2012-03-02 (×5): qty 1

## 2012-03-02 MED ORDER — MOXIFLOXACIN HCL IN NACL 400 MG/250ML IV SOLN
INTRAVENOUS | Status: AC
  Filled 2012-03-02: qty 250

## 2012-03-02 MED ORDER — MOXIFLOXACIN HCL IN NACL 400 MG/250ML IV SOLN
400.0000 mg | INTRAVENOUS | Status: DC
  Administered 2012-03-02 – 2012-03-03 (×2): 400 mg via INTRAVENOUS
  Filled 2012-03-02 (×3): qty 250

## 2012-03-02 MED ORDER — ALBUTEROL (5 MG/ML) CONTINUOUS INHALATION SOLN
INHALATION_SOLUTION | RESPIRATORY_TRACT | Status: AC
  Filled 2012-03-02: qty 20

## 2012-03-02 MED ORDER — SODIUM CHLORIDE 0.9 % IJ SOLN
INTRAMUSCULAR | Status: AC
  Filled 2012-03-02: qty 3

## 2012-03-02 MED ORDER — POTASSIUM CHLORIDE IN NACL 20-0.9 MEQ/L-% IV SOLN
INTRAVENOUS | Status: AC
  Administered 2012-03-02 – 2012-03-03 (×2): via INTRAVENOUS

## 2012-03-02 MED ORDER — HYDROCODONE-ACETAMINOPHEN 5-325 MG PO TABS
1.0000 | ORAL_TABLET | ORAL | Status: DC | PRN
  Administered 2012-03-03: 1 via ORAL
  Administered 2012-03-05: 2 via ORAL
  Filled 2012-03-02: qty 1
  Filled 2012-03-02: qty 2

## 2012-03-02 MED ORDER — ONDANSETRON HCL 4 MG PO TABS: 4.0000 mg | ORAL_TABLET | Freq: Four times a day (QID) | ORAL | Status: DC | PRN

## 2012-03-02 MED ORDER — ALBUTEROL SULFATE (5 MG/ML) 0.5% IN NEBU
10.0000 mg | INHALATION_SOLUTION | Freq: Once | RESPIRATORY_TRACT | Status: AC
  Administered 2012-03-02: 10 mg via RESPIRATORY_TRACT

## 2012-03-02 MED ORDER — DULOXETINE HCL 30 MG PO CPEP
30.0000 mg | ORAL_CAPSULE | Freq: Every day | ORAL | Status: DC
  Administered 2012-03-03 – 2012-03-05 (×3): 30 mg via ORAL
  Filled 2012-03-02 (×3): qty 1

## 2012-03-02 MED ORDER — INSULIN ASPART 100 UNIT/ML ~~LOC~~ SOLN
0.0000 [IU] | Freq: Every day | SUBCUTANEOUS | Status: DC
  Administered 2012-03-03: 4 [IU] via SUBCUTANEOUS
  Administered 2012-03-04: 3 [IU] via SUBCUTANEOUS

## 2012-03-02 MED ORDER — FLUTICASONE-SALMETEROL 250-50 MCG/DOSE IN AEPB
1.0000 | INHALATION_SPRAY | Freq: Two times a day (BID) | RESPIRATORY_TRACT | Status: DC
  Administered 2012-03-02 – 2012-03-05 (×6): 1 via RESPIRATORY_TRACT
  Filled 2012-03-02: qty 14

## 2012-03-02 MED ORDER — INSULIN GLARGINE 100 UNIT/ML ~~LOC~~ SOLN
10.0000 [IU] | Freq: Every day | SUBCUTANEOUS | Status: DC
  Administered 2012-03-03 – 2012-03-04 (×2): 10 [IU] via SUBCUTANEOUS

## 2012-03-02 MED ORDER — ACETAMINOPHEN 325 MG PO TABS: 650.0000 mg | ORAL_TABLET | Freq: Four times a day (QID) | ORAL | Status: DC | PRN

## 2012-03-02 MED ORDER — SODIUM CHLORIDE 0.9 % IV SOLN
INTRAVENOUS | Status: DC
  Administered 2012-03-02: 18:00:00 via INTRAVENOUS

## 2012-03-02 MED ORDER — SODIUM CHLORIDE 0.9 % IJ SOLN
3.0000 mL | Freq: Two times a day (BID) | INTRAMUSCULAR | Status: DC
  Administered 2012-03-04 – 2012-03-05 (×3): 3 mL via INTRAVENOUS
  Filled 2012-03-02 (×3): qty 3

## 2012-03-02 MED ORDER — INSULIN ASPART 100 UNIT/ML ~~LOC~~ SOLN
0.0000 [IU] | Freq: Three times a day (TID) | SUBCUTANEOUS | Status: DC
  Administered 2012-03-03 – 2012-03-04 (×4): 8 [IU] via SUBCUTANEOUS
  Administered 2012-03-04: 3 [IU] via SUBCUTANEOUS
  Administered 2012-03-04: 5 [IU] via SUBCUTANEOUS
  Administered 2012-03-05: 3 [IU] via SUBCUTANEOUS

## 2012-03-02 MED ORDER — CYCLOBENZAPRINE HCL 10 MG PO TABS
10.0000 mg | ORAL_TABLET | Freq: Two times a day (BID) | ORAL | Status: DC
  Administered 2012-03-02 – 2012-03-05 (×6): 10 mg via ORAL
  Filled 2012-03-02 (×6): qty 1

## 2012-03-02 MED ORDER — ALBUTEROL SULFATE (5 MG/ML) 0.5% IN NEBU
2.5000 mg | INHALATION_SOLUTION | RESPIRATORY_TRACT | Status: DC | PRN
  Administered 2012-03-03: 2.5 mg via RESPIRATORY_TRACT
  Filled 2012-03-02: qty 0.5

## 2012-03-02 MED ORDER — ENOXAPARIN SODIUM 40 MG/0.4ML ~~LOC~~ SOLN
40.0000 mg | SUBCUTANEOUS | Status: DC
  Administered 2012-03-03 – 2012-03-05 (×3): 40 mg via SUBCUTANEOUS
  Filled 2012-03-02 (×3): qty 0.4

## 2012-03-02 MED ORDER — FLUTICASONE-SALMETEROL 250-50 MCG/DOSE IN AEPB
INHALATION_SPRAY | RESPIRATORY_TRACT | Status: AC
  Filled 2012-03-02: qty 14

## 2012-03-02 MED ORDER — IPRATROPIUM BROMIDE 0.02 % IN SOLN
0.5000 mg | RESPIRATORY_TRACT | Status: DC
  Administered 2012-03-02 – 2012-03-05 (×13): 0.5 mg via RESPIRATORY_TRACT
  Filled 2012-03-02 (×13): qty 2.5

## 2012-03-02 MED ORDER — ONDANSETRON HCL 4 MG/2ML IJ SOLN: 4.0000 mg | Freq: Four times a day (QID) | INTRAMUSCULAR | Status: DC | PRN

## 2012-03-02 MED ORDER — IPRATROPIUM BROMIDE 0.02 % IN SOLN
1.0000 mg | Freq: Once | RESPIRATORY_TRACT | Status: AC
  Administered 2012-03-02: 1 mg via RESPIRATORY_TRACT
  Filled 2012-03-02: qty 5

## 2012-03-02 MED ORDER — METHYLPREDNISOLONE SODIUM SUCC 125 MG IJ SOLR
60.0000 mg | Freq: Four times a day (QID) | INTRAMUSCULAR | Status: DC
  Administered 2012-03-02 – 2012-03-04 (×7): 60 mg via INTRAVENOUS
  Filled 2012-03-02 (×7): qty 2

## 2012-03-02 NOTE — ED Notes (Signed)
Pt states SOB x 2 days, with productive cough, green in color. Hx of COPD/ Asthma. NAD. Able to speak complete sentences without difficulty.

## 2012-03-02 NOTE — ED Notes (Signed)
Attempted to call report

## 2012-03-02 NOTE — H&P (Signed)
Angelica [name] is an 66 y.o. female.    PCP: Josue Hector, MD, MD   Chief Complaint: Cough and shortness of breath  HPI: This is a 66 year old, Caucasian female, with a past medical history of COPD, hypertension, diabetes, who was in her usual state of health till about 2 days ago, when she started having a cough with yellowish expectoration with some green sputum as well. She started getting short of breath. Had some chest pressure. Had fever. She took Tylenol. Had sweating episodes. Had minimal leg swelling, which she attributes to being chronic. Her husband was sick with similar complaints. She had nausea without any emesis. She went to her doctor's office and was given a steroid injection. She tells me, that she's had the problems with bronchitis since December. She had symptoms in December January February and required the use of steroids.   Home Medications: Prior to Admission medications   Medication Sig Start Date End Date Taking? Authorizing Provider  acetaminophen (TYLENOL) 500 MG tablet Take 1,000 mg by mouth daily as needed. As needed for pain   Yes Historical Provider, MD  albuterol (PROAIR HFA) 108 (90 BASE) MCG/ACT inhaler Inhale 2 puffs into the lungs every 6 (six) hours as needed. For shortness of breath   Yes Historical Provider, MD  albuterol (VENTOLIN HFA) 108 (90 BASE) MCG/ACT inhaler Inhale 2 puffs into the lungs every 6 (six) hours as needed. For shortness of breath   Yes Historical Provider, MD  cyclobenzaprine (FLEXERIL) 10 MG tablet Take 10 mg by mouth 2 (two) times daily.   Yes Historical Provider, MD  DILTIAZEM HCL PO Take 1 capsule by mouth at bedtime.   Yes Historical Provider, MD  diphenhydramine-acetaminophen (TYLENOL PM) 25-500 MG TABS Take 2 tablets by mouth at bedtime as needed. For sleep   Yes Historical Provider, MD  DULoxetine HCl (CYMBALTA PO) Take 1 capsule by mouth every morning.   Yes Historical Provider, MD  ergocalciferol (VITAMIN D2) 50000  UNITS capsule Take 50,000 Units by mouth once a week.   Yes Historical Provider, MD  ESTRADIOL PO Take 1 tablet by mouth at bedtime.   Yes Historical Provider, MD  FENOFIBRATE PO Take 1 tablet by mouth at bedtime.   Yes Historical Provider, MD  Fluticasone-Salmeterol (ADVAIR DISKUS) 250-50 MCG/DOSE AEPB Inhale 1 puff into the lungs every 12 (twelve) hours.   Yes Historical Provider, MD  glimepiride (AMARYL) 4 MG tablet Take 4 mg by mouth every morning.   Yes Historical Provider, MD  HYDROcodone-homatropine (HYCODAN) 5-1.5 MG/5ML syrup Take 5 mLs by mouth every 6 (six) hours as needed. For cough   Yes Historical Provider, MD  LISINOPRIL PO Take 1 tablet by mouth every morning.   Yes Historical Provider, MD  metFORMIN (GLUCOPHAGE) 500 MG tablet Take 1,000 mg by mouth 2 (two) times daily with a meal.   Yes Historical Provider, MD  omeprazole (PRILOSEC) 20 MG capsule Take 20 mg by mouth 2 (two) times daily.   Yes Historical Provider, MD  RAMIPRIL PO Take 1 capsule by mouth at bedtime.   Yes Historical Provider, MD  SitaGLIPtin Phosphate (JANUVIA PO) Take 1 tablet by mouth every morning.   Yes Historical Provider, MD  triamterene-hydrochlorothiazide (MAXZIDE-25) 37.5-25 MG per tablet Take 1 tablet by mouth every morning.   Yes Historical Provider, MD  valsartan (DIOVAN) 160 MG tablet Take 160 mg by mouth every morning.   Yes Historical Provider, MD    Allergies:  Allergies  Allergen Reactions  .  Quinine Derivatives     Decreased platelet counts  . Morphine And Related Rash  . Rocephin (Ceftriaxone Sodium In Dextrose) Rash    Past Medical History: Past Medical History  Diagnosis Date  . Diabetes mellitus   . Hypertension   . COPD (chronic obstructive pulmonary disease)   . Asthma   . Arthritis     Past Surgical History  Procedure Date  . Cesarean section   . Abdominal hysterectomy   . Total knee arthroplasty   . Back surgery   . Tonsillectomy   . Foot surgery   . Tubal ligation     . Cholecystectomy     Social History:  reports that she has been smoking Cigarettes.  She has been smoking about 1 pack per day. She does not have any smokeless tobacco history on file. She reports that she does not drink alcohol or use illicit drugs.  Family History: She reports history of heart disease in the family.  Review of Systems - History obtained from the patient General ROS: positive for  - chills Psychological ROS: negative Ophthalmic ROS: negative ENT ROS: negative Allergy and Immunology ROS: negative Hematological and Lymphatic ROS: negative Endocrine ROS: negative Respiratory ROS: positive for - cough Cardiovascular ROS: positive for - shortness of breath Gastrointestinal ROS: negative Genito-Urinary ROS: negative Musculoskeletal ROS: negative Neurological ROS: negative Dermatological ROS: negative  Physical Examination Blood pressure 136/82, pulse 104, temperature 98.7 F (37.1 C), temperature source Oral, resp. rate 24, height 5\' 1"  (1.549 m), weight 85.6 kg (188 lb 11.4 oz), SpO2 92.00%.  General appearance: alert, cooperative, appears stated age and no distress Head: Normocephalic, without obvious abnormality, atraumatic Eyes: conjunctivae/corneas clear. PERRL, EOM's intact. Throat: lips, mucosa, and tongue normal; teeth and gums normal Neck: no adenopathy, no carotid bruit, no JVD, supple, symmetrical, trachea midline and thyroid not enlarged, symmetric, no tenderness/mass/nodules Resp: And expiratory wheezing is heard bilaterally, and diffusely. No definite crackles. Cardio: tachycardic, no murmur, click, rub or gallop GI: soft, non-tender; bowel sounds normal; no masses,  no organomegaly Extremities: extremities normal, atraumatic, no cyanosis or edema Pulses: 2+ and symmetric Skin: Skin color, texture, turgor normal. No rashes or lesions Lymph nodes: Cervical, supraclavicular, and axillary nodes normal. Neurologic: Grossly normal  Laboratory  Data: Results for orders placed during the hospital encounter of 03/02/12 (from the past 48 hour(s))  TROPONIN I     Status: Normal   Collection Time   03/02/12  6:19 PM      Component Value Range Comment   Troponin I <0.30  <0.30 (ng/mL)   CBC     Status: Abnormal   Collection Time   03/02/12  6:19 PM      Component Value Range Comment   WBC 21.0 (*) 4.0 - 10.5 (K/uL)    RBC 4.09  3.87 - 5.11 (MIL/uL)    Hemoglobin 10.5 (*) 12.0 - 15.0 (g/dL)    HCT 16.1 (*) 09.6 - 46.0 (%)    MCV 82.2  78.0 - 100.0 (fL)    MCH 25.7 (*) 26.0 - 34.0 (pg)    MCHC 31.3  30.0 - 36.0 (g/dL)    RDW 04.5 (*) 40.9 - 15.5 (%)    Platelets 670 (*) 150 - 400 (K/uL)   DIFFERENTIAL     Status: Abnormal   Collection Time   03/02/12  6:19 PM      Component Value Range Comment   Neutrophils Relative 86 (*) 43 - 77 (%)    Neutro Abs 18.1 (*)  1.7 - 7.7 (K/uL)    Lymphocytes Relative 7 (*) 12 - 46 (%)    Lymphs Abs 1.4  0.7 - 4.0 (K/uL)    Monocytes Relative 7  3 - 12 (%)    Monocytes Absolute 1.5 (*) 0.1 - 1.0 (K/uL)    Eosinophils Relative 0  0 - 5 (%)    Eosinophils Absolute 0.0  0.0 - 0.7 (K/uL)    Basophils Relative 0  0 - 1 (%)    Basophils Absolute 0.0  0.0 - 0.1 (K/uL)   BASIC METABOLIC PANEL     Status: Abnormal   Collection Time   03/02/12  6:19 PM      Component Value Range Comment   Sodium 126 (*) 135 - 145 (mEq/L)    Potassium 3.8  3.5 - 5.1 (mEq/L)    Chloride 88 (*) 96 - 112 (mEq/L)    CO2 27  19 - 32 (mEq/L)    Glucose, Bld 134 (*) 70 - 99 (mg/dL)    BUN 15  6 - 23 (mg/dL)    Creatinine, Ser 8.65  0.50 - 1.10 (mg/dL)    Calcium 78.4  8.4 - 10.5 (mg/dL)    GFR calc non Af Amer 89 (*) >90 (mL/min)    GFR calc Af Amer >90  >90 (mL/min)     Radiology Reports: Dg Chest 2 View  03/02/2012  *RADIOLOGY REPORT*  Clinical Data: Cough and congestion.  History of COPD appear  CHEST - 2 VIEW  Comparison: Read chest radiograph 11/27/2011  Findings: The heart, mediastinal, and hilar contours are  stable. Heart size is normal.  There is slight peribronchial thickening bilaterally.  No focal airspace disease, effusion, or pneumothorax. Mild superior endplate compression deformity of T12 vertebral body appears stable.  No acute bony abnormality.  IMPRESSION: 1. Bilateral peribronchial thickening.  This can be seen in the setting of acute or chronic bronchitis, smoking, or asthma. 2. Stable mild compression deformity T12.  Original Report Authenticated By: Britta Mccreedy, M.D.    Electrocardiogram: Sinus tachycardia at 105 beats per minute. Left axis deviation. Right bundle branch block. No definite Q waves. No older EKG available for comparison.  Assessment/Plan  Principal Problem:  *COPD exacerbation Active Problems:  Hyponatremia  Anemia  HTN (hypertension)  DM type 2 (diabetes mellitus, type 2)   #1 COPD exacerbation: She'll be treated with steroids, nebulizer treatment. Antibiotics. Smoking cessation has been emphasized.  #2 hyponatremia: Etiology remains unclear. Could have something to do with steroids. We'll give her IV fluids. Check urine osmolality and urine sodium and recheck labs in the morning.  #3 anemia: check anemia panel in the morning.  #4 hypertension: Monitor her blood pressures closely. The dosage of many of her antihypertensive agents are not known at this time.  #5, diabetes. We'll put her on Lantus, because she'll be on intravenous steroids. We'll check HbA1c and put her on a sliding scale as well.  EKG will be repeated in the morning. Troponin was negative.  Further management decisions will depend on results of further testing and patient's response to treatment.  She's a full code. DVT, prophylaxis will be initiated.  [facility]  [facility] Pager [phone]  03/02/2012, 11:17 PM

## 2012-03-02 NOTE — ED Provider Notes (Signed)
History     CSN: [id number]  Arrival date & time 03/02/12  1721   First MD Initiated Contact with Patient 03/02/12 1729      Chief Complaint  Patient presents with  . Shortness of Breath    HPI Pt was seen at 1810.  Per pt, c/o gradual onset and worsening of persistent cough, SOB and "wheezing" for the past 1 week.  Pt was eval in her PMD's ofc PTA, was given 2 nebs and IM steroid without relief.  Pt states her O2 Sats in the office were "in the 80's."  Denies CP/palpitations, no fevers, no rash, no abd pain, no back pain, no N/V/D.     PMD:  Dr. Lysbeth Galas Past Medical History  Diagnosis Date  . Diabetes mellitus   . Hypertension   . COPD (chronic obstructive pulmonary disease)   . Asthma   . Arthritis     Past Surgical History  Procedure Date  . Cesarean section   . Abdominal hysterectomy   . Total knee arthroplasty   . Back surgery   . Tonsillectomy   . Foot surgery   . Tubal ligation   . Cholecystectomy     History  Substance Use Topics  . Smoking status: Current Everyday Smoker -- 1.0 packs/day    Types: Cigarettes  . Smokeless tobacco: Not on file  . Alcohol Use: No    Review of Systems ROS: Statement: All systems negative except as marked or noted in the HPI; Constitutional: Negative for fever and chills. ; ; Eyes: Negative for eye pain, redness and discharge. ; ; ENMT: Negative for ear pain, hoarseness, nasal congestion, sinus pressure and sore throat. ; ; Cardiovascular: Negative for chest pain, palpitations, diaphoresis and peripheral edema. ; ; Respiratory: +cough, wheezing, SOB.  Negative for stridor. ; ; Gastrointestinal: Negative for nausea, vomiting, diarrhea, abdominal pain, blood in stool, hematemesis, jaundice and rectal bleeding. . ; ; Genitourinary: Negative for dysuria, flank pain and hematuria. ; ; Musculoskeletal: Negative for back pain and neck pain. Negative for swelling and trauma.; ; Skin: Negative for pruritus, rash, abrasions, blisters,  bruising and skin lesion.; ; Neuro: Negative for headache, lightheadedness and neck stiffness. Negative for weakness, altered level of consciousness , altered mental status, extremity weakness, paresthesias, involuntary movement, seizure and syncope.     Allergies  Quinine derivatives; Morphine and related; and Rocephin  Home Medications   Current Outpatient Rx  Name Route Sig Dispense Refill  . PREDNISONE 10 MG PO TABS Oral Take 6 tablets (60 mg total) by mouth daily. 24 tablet 0    BP 131/59  Pulse 103  Temp(Src) 98.6 F (37 C) (Oral)  Resp 18  Ht 5\' 1"  (1.549 m)  Wt 186 lb (84.369 kg)  BMI 35.14 kg/m2  SpO2 93%  Physical Exam 1815: Physical examination:  Nursing notes reviewed; Vital signs and O2 SAT reviewed;  Constitutional: Well developed, Well nourished, Well hydrated, In no acute distress; Head:  Normocephalic, atraumatic; Eyes: EOMI, PERRL, No scleral icterus; ENMT: Mouth and pharynx normal, Mucous membranes moist; Neck: Supple, Full range of motion, No lymphadenopathy; Cardiovascular: Tachycardic rate and rhythm, No murmur, rub, or gallop; Respiratory: Breath sounds coarse & equal bilaterally, with scattered wheezes and non-productive cough, speaking in full sentences without distress, no audible wheezing, Normal respiratory effort/excursion; Chest: Nontender, Movement normal; Abdomen: Soft, Nontender, Nondistended, Normal bowel sounds; Extremities: Pulses normal, No tenderness, No edema, No calf edema or asymmetry.; Neuro: AA&Ox3, Major CN grossly intact.  No gross focal  motor or sensory deficits in extremities.; Skin: Color normal, Warm, Dry, no rash.    ED Course  Procedures   MDM  MDM Reviewed: nursing note and vitals Reviewed previous: ECG Interpretation: ECG, labs and x-ray    Date: 03/02/2012  Rate: 105  Rhythm: sinus tachycardia  QRS Axis: left  Intervals: normal  ST/T Wave abnormalities: normal  Conduction Disutrbances:right bundle branch block and left  anterior fascicular block  Narrative Interpretation:   Old EKG Reviewed: unchanged; no significant changes from previous EKG dated 02/10/2009.  Results for orders placed during the hospital encounter of 03/02/12  TROPONIN I      Component Value Range   Troponin I <0.30  <0.30 (ng/mL)  CBC      Component Value Range   WBC 21.0 (*) 4.0 - 10.5 (K/uL)   RBC 4.09  3.87 - 5.11 (MIL/uL)   Hemoglobin 10.5 (*) 12.0 - 15.0 (g/dL)   HCT 16.1 (*) 09.6 - 46.0 (%)   MCV 82.2  78.0 - 100.0 (fL)   MCH 25.7 (*) 26.0 - 34.0 (pg)   MCHC 31.3  30.0 - 36.0 (g/dL)   RDW 04.5 (*) 40.9 - 15.5 (%)   Platelets 670 (*) 150 - 400 (K/uL)  DIFFERENTIAL      Component Value Range   Neutrophils Relative 86 (*) 43 - 77 (%)   Neutro Abs 18.1 (*) 1.7 - 7.7 (K/uL)   Lymphocytes Relative 7 (*) 12 - 46 (%)   Lymphs Abs 1.4  0.7 - 4.0 (K/uL)   Monocytes Relative 7  3 - 12 (%)   Monocytes Absolute 1.5 (*) 0.1 - 1.0 (K/uL)   Eosinophils Relative 0  0 - 5 (%)   Eosinophils Absolute 0.0  0.0 - 0.7 (K/uL)   Basophils Relative 0  0 - 1 (%)   Basophils Absolute 0.0  0.0 - 0.1 (K/uL)  BASIC METABOLIC PANEL      Component Value Range   Sodium 126 (*) 135 - 145 (mEq/L)   Potassium 3.8  3.5 - 5.1 (mEq/L)   Chloride 88 (*) 96 - 112 (mEq/L)   CO2 27  19 - 32 (mEq/L)   Glucose, Bld 134 (*) 70 - 99 (mg/dL)   BUN 15  6 - 23 (mg/dL)   Creatinine, Ser 8.11  0.50 - 1.10 (mg/dL)   Calcium 91.4  8.4 - 10.5 (mg/dL)   GFR calc non Af Amer 89 (*) >90 (mL/min)   GFR calc Af Amer >90  >90 (mL/min)   Results for Angelica, [name] (MRN [id number]) as of 03/02/2012 20:50  Ref. Range 11/27/2011 23:21 03/02/2012 18:19  HGB Latest Range: 12.0-15.0 g/dL 08.6 (L) 57.8 (L)  HCT Latest Range: 36.0-46.0 % 34.9 (L) 33.6 (L)    Results for Angelica, [name] (MRN [id number]) as of 03/02/2012 20:50  Ref. Range 04/16/2008 10:59 02/10/2009 12:00 04/29/2009 11:20 11/27/2011 23:29 03/02/2012 18:19  Sodium Latest Range: 135-145 mEq/L 134 (L) 138 136 132 (L) 126  (L)    Dg Chest 2 View 03/02/2012  *RADIOLOGY REPORT*  Clinical Data: Cough and congestion.  History of COPD appear  CHEST - 2 VIEW  Comparison: Read chest radiograph 11/27/2011  Findings: The heart, mediastinal, and hilar contours are stable. Heart size is normal.  There is slight peribronchial thickening bilaterally.  No focal airspace disease, effusion, or pneumothorax. Mild superior endplate compression deformity of T12 vertebral body appears stable.  No acute bony abnormality.  IMPRESSION: 1. Bilateral peribronchial thickening.  This can be seen  in the setting of acute or chronic bronchitis, smoking, or asthma. 2. Stable mild compression deformity T12.  Original Report Authenticated By: Britta Mccreedy, M.D.     8:48 PM:  Pt completed hour long neb.  Already given IM solumedrol in PMD ofc PTA.  Pt ambulated with O2 Sat dropping to 80% on R/A with increasing SOB/tachypnea.  Pt's O2 Sat slowly increasing to 91% on O2 2L N/C when back to stretcher and sitting.  Denies CP.  H/H near baseline.  Hyponatremic, which is new since previous labs.  Dx testing d/w pt and family.  Questions answered.  Verb understanding, agreeable to admit.   8:51 PM:  T/C to [facility]. Rito Ehrlich, case discussed, including:  HPI, pertinent PM/SHx, VS/PE, dx testing, ED course and treatment:  Agreeable to admit, requests to obtain medical bed to team 2.            Laray Anger, DO 03/04/12 1417

## 2012-03-03 LAB — CBC
Platelets: 589 10*3/uL — ABNORMAL HIGH (ref 150–400)
RBC: 4.02 MIL/uL (ref 3.87–5.11)
RDW: 16 % — ABNORMAL HIGH (ref 11.5–15.5)
WBC: 20.3 10*3/uL — ABNORMAL HIGH (ref 4.0–10.5)

## 2012-03-03 LAB — COMPREHENSIVE METABOLIC PANEL
Alkaline Phosphatase: 58 U/L (ref 39–117)
BUN: 15 mg/dL (ref 6–23)
Chloride: 95 mEq/L — ABNORMAL LOW (ref 96–112)
Creatinine, Ser: 0.64 mg/dL (ref 0.50–1.10)
GFR calc Af Amer: >90 mL/min (ref >90)
Glucose, Bld: 254 mg/dL — ABNORMAL HIGH (ref 70–99)
Potassium: 3.9 mEq/L (ref 3.5–5.1)
Total Bilirubin: 0.2 mg/dL — ABNORMAL LOW (ref 0.3–1.2)

## 2012-03-03 LAB — RETICULOCYTES
RBC.: 4.02 MIL/uL (ref 3.87–5.11)
Retic Ct Pct: 2.1 % (ref 0.4–3.1)

## 2012-03-03 LAB — HEMOGLOBIN A1C
Hgb A1c MFr Bld: 6.4 % — ABNORMAL HIGH (ref <5.7)
Mean Plasma Glucose: 137 mg/dL — ABNORMAL HIGH (ref <117)

## 2012-03-03 LAB — IRON AND TIBC

## 2012-03-03 LAB — GLUCOSE, CAPILLARY
Glucose-Capillary: 253 mg/dL — ABNORMAL HIGH (ref 70–99)
Glucose-Capillary: 325 mg/dL — ABNORMAL HIGH (ref 70–99)

## 2012-03-03 LAB — FERRITIN: Ferritin: 44 ng/mL (ref 10–291)

## 2012-03-03 MED ORDER — GUAIFENESIN ER 600 MG PO TB12
1200.0000 mg | ORAL_TABLET | Freq: Two times a day (BID) | ORAL | Status: DC
  Administered 2012-03-03 – 2012-03-05 (×5): 1200 mg via ORAL
  Filled 2012-03-03 (×4): qty 2
  Filled 2012-03-03: qty 1
  Filled 2012-03-03: qty 2

## 2012-03-03 NOTE — Progress Notes (Signed)
Subjective: Breathing a little better but still short of breath, coughing  Objective: Vital signs in last 24 hours: Temp:  [97.6 F (36.4 C)-98.7 F (37.1 C)] 97.6 F (36.4 C) (03/16 0421) Pulse Rate:  [101-116] 101  (03/16 0421) Resp:  [18-24] 20  (03/16 0421) BP: (131-160)/(57-85) 160/85 mmHg (03/16 0421) SpO2:  [88 %-95 %] 91 % (03/16 0813) Weight:  [84.369 kg (186 lb)-85.6 kg (188 lb 11.4 oz)] 85.6 kg (188 lb 11.4 oz) (03/15 2311) Weight change:  Last BM Date: 03/02/12  Intake/Output from previous day: 03/15 0701 - 03/16 0700 In: 754 [I.V.:754] Out: -      Physical Exam: General: Alert, awake, oriented x3, in no acute distress. HEENT: No bruits, no goiter. Heart: Regular rate and rhythm, without murmurs, rubs, gallops. Lungs: b/l exp wheezes Abdomen: Soft, nontender, nondistended, positive bowel sounds. Extremities: No clubbing cyanosis or edema with positive pedal pulses. Neuro: Grossly intact, nonfocal.    Lab Results: Basic Metabolic Panel:  Basename 03/03/12 0645 03/02/12 1819  NA 131* 126*  K 3.9 3.8  CL 95* 88*  CO2 29 27  GLUCOSE 254* 134*  BUN 15 15  CREATININE 0.64 0.68  CALCIUM 10.0 10.4  MG -- --  PHOS -- --   Liver Function Tests:  Basename 03/03/12 0645  AST 7  ALT 9  ALKPHOS 58  BILITOT 0.2*  PROT 7.3  ALBUMIN 3.0*   No results found for this basename: LIPASE:2,AMYLASE:2 in the last 72 hours No results found for this basename: AMMONIA:2 in the last 72 hours CBC:  Basename 03/03/12 0645 03/02/12 1819  WBC 20.3* 21.0*  NEUTROABS -- 18.1*  HGB 10.1* 10.5*  HCT 33.0* 33.6*  MCV 82.1 82.2  PLT 589* 670*   Cardiac Enzymes:  Basename 03/02/12 1819  CKTOTAL --  CKMB --  CKMBINDEX --  TROPONINI <0.30   BNP: No results found for this basename: PROBNP:3 in the last 72 hours D-Dimer: No results found for this basename: DDIMER:2 in the last 72 hours CBG:  Basename 03/03/12 0759  GLUCAP 253*   Hemoglobin A1C: No results  found for this basename: HGBA1C in the last 72 hours Fasting Lipid Panel: No results found for this basename: CHOL,HDL,LDLCALC,TRIG,CHOLHDL,LDLDIRECT in the last 72 hours Thyroid Function Tests: No results found for this basename: TSH,T4TOTAL,FREET4,T3FREE,THYROIDAB in the last 72 hours Anemia Panel:  Basename 03/03/12 0645  VITAMINB12 --  FOLATE --  FERRITIN --  TIBC --  IRON --  RETICCTPCT 2.1   Coagulation: No results found for this basename: LABPROT:2,INR:2 in the last 72 hours Urine Drug Screen: Drugs of Abuse  No results found for this basename: labopia, cocainscrnur, labbenz, amphetmu, thcu, labbarb    Alcohol Level: No results found for this basename: ETH:2 in the last 72 hours Urinalysis: No results found for this basename: COLORURINE:2,APPERANCEUR:2,LABSPEC:2,PHURINE:2,GLUCOSEU:2,HGBUR:2,BILIRUBINUR:2,KETONESUR:2,PROTEINUR:2,UROBILINOGEN:2,NITRITE:2,LEUKOCYTESUR:2 in the last 72 hours  No results found for this or any previous visit (from the past 240 hour(s)).  Studies/Results: Dg Chest 2 View  03/02/2012  *RADIOLOGY REPORT*  Clinical Data: Cough and congestion.  History of COPD appear  CHEST - 2 VIEW  Comparison: Read chest radiograph 11/27/2011  Findings: The heart, mediastinal, and hilar contours are stable. Heart size is normal.  There is slight peribronchial thickening bilaterally.  No focal airspace disease, effusion, or pneumothorax. Mild superior endplate compression deformity of T12 vertebral body appears stable.  No acute bony abnormality.  IMPRESSION: 1. Bilateral peribronchial thickening.  This can be seen in the setting of acute or chronic  bronchitis, smoking, or asthma. 2. Stable mild compression deformity T12.  Original Report Authenticated By: Britta Mccreedy, M.D.    Medications: Scheduled Meds:   . albuterol  10 mg Nebulization Once  . albuterol  2.5 mg Nebulization Q4H  . albuterol      . cyclobenzaprine  10 mg Oral BID  . docusate sodium  100 mg  Oral BID  . DULoxetine  30 mg Oral Daily  . enoxaparin  40 mg Subcutaneous Q24H  . Fluticasone-Salmeterol  1 puff Inhalation Q12H  . insulin aspart  0-15 Units Subcutaneous TID WC  . insulin aspart  0-5 Units Subcutaneous QHS  . insulin glargine  10 Units Subcutaneous QHS  . ipratropium  0.5 mg Nebulization Q4H  . ipratropium  1 mg Nebulization Once  . irbesartan  150 mg Oral Daily  . methylPREDNISolone (SOLU-MEDROL) injection  60 mg Intravenous Q6H  . moxifloxacin  400 mg Intravenous Q24H  . pantoprazole  40 mg Oral BID AC  . sodium chloride  3 mL Intravenous Q12H  . sodium chloride       Continuous Infusions:   . 0.9 % NaCl with KCl 20 mEq / L 100 mL/hr at 03/02/12 2330  . DISCONTD: sodium chloride 75 mL/hr at 03/02/12 1825   PRN Meds:.acetaminophen, acetaminophen, albuterol, HYDROcodone-acetaminophen, ondansetron (ZOFRAN) IV, ondansetron  Assessment/Plan:  Principal Problem:  *COPD exacerbation likely due to acute bronchitis Active Problems:  Hyponatremia  Anemia  HTN (hypertension)  DM type 2 (diabetes mellitus, type 2)  Plan:  Respiratory status is improving, continue nebs, abx, steroids and mucolytics Hyponatremia likely due to dehydration, improved with saline Leukocytosis likely due to steroids, does not appear toxic Mild anemia, can be followed as an outpatient Monitor blood sugars while on steroids.  Anticipate that she will be ready for discharge in next 24-48hours   LOS: 1 day   Joette Schmoker [facility] Pager: [phone] 03/03/2012, 10:43 AM

## 2012-03-04 LAB — OSMOLALITY, URINE: Osmolality, Ur: 290 mOsm/kg — ABNORMAL LOW (ref 390–1090)

## 2012-03-04 LAB — BASIC METABOLIC PANEL
Chloride: 99 mEq/L (ref 96–112)
Creatinine, Ser: 0.56 mg/dL (ref 0.50–1.10)
GFR calc Af Amer: >90 mL/min (ref >90)
Potassium: 4.6 mEq/L (ref 3.5–5.1)
Sodium: 135 mEq/L (ref 135–145)

## 2012-03-04 LAB — GLUCOSE, CAPILLARY
Glucose-Capillary: 183 mg/dL — ABNORMAL HIGH (ref 70–99)
Glucose-Capillary: 297 mg/dL — ABNORMAL HIGH (ref 70–99)

## 2012-03-04 MED ORDER — LEVOFLOXACIN 500 MG PO TABS
750.0000 mg | ORAL_TABLET | Freq: Every day | ORAL | Status: DC
  Administered 2012-03-04 – 2012-03-05 (×2): 750 mg via ORAL
  Filled 2012-03-04 (×2): qty 1

## 2012-03-04 MED ORDER — SODIUM CHLORIDE 0.9 % IJ SOLN
INTRAMUSCULAR | Status: AC
  Administered 2012-03-04: 11:00:00
  Filled 2012-03-04: qty 3

## 2012-03-04 MED ORDER — HYDROCOD POLST-CHLORPHEN POLST 10-8 MG/5ML PO LQCR
5.0000 mL | Freq: Two times a day (BID) | ORAL | Status: DC | PRN
  Administered 2012-03-04 – 2012-03-05 (×2): 5 mL via ORAL
  Filled 2012-03-04 (×2): qty 5

## 2012-03-04 MED ORDER — PREDNISONE 20 MG PO TABS
50.0000 mg | ORAL_TABLET | Freq: Every day | ORAL | Status: DC
  Administered 2012-03-04 – 2012-03-05 (×2): 50 mg via ORAL
  Filled 2012-03-04: qty 2
  Filled 2012-03-04 (×2): qty 1

## 2012-03-04 NOTE — Progress Notes (Signed)
Subjective: Breathing feels better, she has a persistent cough  Objective: Vital signs in last 24 hours: Temp:  [97.2 F (36.2 C)-98.1 F (36.7 C)] 97.2 F (36.2 C) (03/17 0606) Pulse Rate:  [94-115] 94  (03/17 0732) Resp:  [20-24] 24  (03/17 0606) BP: (121-153)/(70-77) 121/70 mmHg (03/17 0606) SpO2:  [91 %-98 %] 98 % (03/17 1129) Weight change:  Last BM Date: 03/02/12  Intake/Output from previous day: 03/16 0701 - 03/17 0700 In: 1986.7 [P.O.:960; I.V.:1026.7] Out: -  Total I/O In: 240 [P.O.:240] Out: -    Physical Exam: General: Alert, awake, oriented x3, in no acute distress. HEENT: No bruits, no goiter. Heart: Regular rate and rhythm, without murmurs, rubs, gallops. Lungs: b/l wheezing improving Abdomen: Soft, nontender, nondistended, positive bowel sounds. Extremities: No clubbing cyanosis or edema with positive pedal pulses. Neuro: Grossly intact, nonfocal.    Lab Results: Basic Metabolic Panel:  Basename 03/04/12 0645 03/03/12 0645  NA 135 131*  K 4.6 3.9  CL 99 95*  CO2 29 29  GLUCOSE 202* 254*  BUN 22 15  CREATININE 0.56 0.64  CALCIUM 9.7 10.0  MG -- --  PHOS -- --   Liver Function Tests:  [facility] 03/03/12 0645  AST 7  ALT 9  ALKPHOS 58  BILITOT 0.2*  PROT 7.3  ALBUMIN 3.0*   No results found for this basename: LIPASE:2,AMYLASE:2 in the last 72 hours No results found for this basename: AMMONIA:2 in the last 72 hours CBC:  Basename 03/03/12 0645 03/02/12 1819  WBC 20.3* 21.0*  NEUTROABS -- 18.1*  HGB 10.1* 10.5*  HCT 33.0* 33.6*  MCV 82.1 82.2  PLT 589* 670*   Cardiac Enzymes:  Basename 03/02/12 1819  CKTOTAL --  CKMB --  CKMBINDEX --  TROPONINI <0.30   BNP: No results found for this basename: PROBNP:3 in the last 72 hours D-Dimer: No results found for this basename: DDIMER:2 in the last 72 hours CBG:  Basename 03/04/12 1116 03/04/12 0721 03/03/12 2128 03/03/12 1630 03/03/12 1112 03/03/12 0759  GLUCAP 217* 183* 325* 285*  294* 253*   Hemoglobin A1C:  Basename 03/02/12 1819  HGBA1C 6.4*   Fasting Lipid Panel: No results found for this basename: CHOL,HDL,LDLCALC,TRIG,CHOLHDL,LDLDIRECT in the last 72 hours Thyroid Function Tests:  Basename 03/02/12 1819  TSH 1.279  T4TOTAL --  FREET4 --  T3FREE --  THYROIDAB --   Anemia Panel:  Basename 03/03/12 0645  VITAMINB12 118*  FOLATE 7.9  FERRITIN 44  TIBC Not calculated due to Iron <10.  IRON <10*  RETICCTPCT 2.1   Coagulation: No results found for this basename: LABPROT:2,INR:2 in the last 72 hours Urine Drug Screen: Drugs of Abuse  No results found for this basename: labopia, cocainscrnur, labbenz, amphetmu, thcu, labbarb    Alcohol Level: No results found for this basename: ETH:2 in the last 72 hours Urinalysis: No results found for this basename: COLORURINE:2,APPERANCEUR:2,LABSPEC:2,PHURINE:2,GLUCOSEU:2,HGBUR:2,BILIRUBINUR:2,KETONESUR:2,PROTEINUR:2,UROBILINOGEN:2,NITRITE:2,LEUKOCYTESUR:2 in the last 72 hours  No results found for this or any previous visit (from the past 240 hour(s)).  Studies/Results: Dg Chest 2 View  03/02/2012  *RADIOLOGY REPORT*  Clinical Data: Cough and congestion.  History of COPD appear  CHEST - 2 VIEW  Comparison: Read chest radiograph 11/27/2011  Findings: The heart, mediastinal, and hilar contours are stable. Heart size is normal.  There is slight peribronchial thickening bilaterally.  No focal airspace disease, effusion, or pneumothorax. Mild superior endplate compression deformity of T12 vertebral body appears stable.  No acute bony abnormality.  IMPRESSION: 1. Bilateral peribronchial thickening.  This can be seen in the setting of acute or chronic bronchitis, smoking, or asthma. 2. Stable mild compression deformity T12.  Original Report Authenticated By: Britta Mccreedy, M.D.    Medications: Scheduled Meds:   . albuterol  2.5 mg Nebulization Q4H  . cyclobenzaprine  10 mg Oral BID  . docusate sodium  100 mg Oral  BID  . DULoxetine  30 mg Oral Daily  . enoxaparin  40 mg Subcutaneous Q24H  . Fluticasone-Salmeterol  1 puff Inhalation Q12H  . guaiFENesin  1,200 mg Oral BID  . insulin aspart  0-15 Units Subcutaneous TID WC  . insulin aspart  0-5 Units Subcutaneous QHS  . insulin glargine  10 Units Subcutaneous QHS  . ipratropium  0.5 mg Nebulization Q4H  . irbesartan  150 mg Oral Daily  . methylPREDNISolone (SOLU-MEDROL) injection  60 mg Intravenous Q6H  . moxifloxacin  400 mg Intravenous Q24H  . pantoprazole  40 mg Oral BID AC  . sodium chloride  3 mL Intravenous Q12H  . sodium chloride       Continuous Infusions:   . 0.9 % NaCl with KCl 20 mEq / L 100 mL/hr at 03/03/12 1516   PRN Meds:.acetaminophen, acetaminophen, albuterol, HYDROcodone-acetaminophen, ondansetron (ZOFRAN) IV, ondansetron  Assessment/Plan:  Principal Problem:  *COPD exacerbation Active Problems:  Hyponatremia  Anemia  HTN (hypertension)  DM type 2 (diabetes mellitus, type 2)  Plan:  Resp status continues to improve, transition to prednisone and oral levaquin, cont pulmonary hygiene and mucolytics, ambulate patient and check sats on room air  If continued improvement then likely home tomorrow, may need to go home on oxygen if sats drop on ambulation   LOS: 2 days   Chyler Creely [facility] Pager: [phone] 03/04/2012, 11:59 AM

## 2012-03-05 MED ORDER — HYDROCOD POLST-CHLORPHEN POLST 10-8 MG/5ML PO LQCR: 5.0000 mL | Freq: Two times a day (BID) | ORAL | Status: DC | PRN

## 2012-03-05 MED ORDER — ALBUTEROL SULFATE (5 MG/ML) 0.5% IN NEBU: 2.5000 mg | INHALATION_SOLUTION | Freq: Four times a day (QID) | RESPIRATORY_TRACT | Status: DC | PRN

## 2012-03-05 MED ORDER — PREDNISONE 10 MG PO TABS: ORAL_TABLET | ORAL | Status: DC

## 2012-03-05 MED ORDER — LEVOFLOXACIN 750 MG PO TABS: 750.0000 mg | ORAL_TABLET | Freq: Every day | ORAL | Status: AC

## 2012-03-05 MED ORDER — GUAIFENESIN ER 600 MG PO TB12: 1200.0000 mg | ORAL_TABLET | Freq: Two times a day (BID) | ORAL | Status: DC

## 2012-03-05 MED ORDER — NICOTINE 21-14-7 MG/24HR TD KIT: 1.0000 | PACK | Freq: Every day | TRANSDERMAL | Status: DC

## 2012-03-05 MED ORDER — BENZONATATE 100 MG PO CAPS: 100.0000 mg | ORAL_CAPSULE | Freq: Three times a day (TID) | ORAL | Status: AC | PRN

## 2012-03-05 NOTE — Progress Notes (Signed)
CARE MANAGEMENT NOTE 03/05/2012  Patient:  Angelica [name], Angelica [name]   Account Number:  [id number]  Date Initiated:  03/05/2012  Documentation initiated by:  Rosemary Holms  Subjective/Objective Assessment:   Pt admitted with COPD/Bronchitis. PTA lived at home with spouse.     Action/Plan:   Spoke with pt. Pt declines HH.   Anticipated DC Date:  03/05/2012   Anticipated DC Plan:  HOME/SELF CARE      DC Planning Services  CM consult      Choice offered to / List presented to:             Status of service:  Completed, signed off Medicare Important Message given?   (If response is "NO", the following Medicare IM given date fields will be blank) Date Medicare IM given:   Date Additional Medicare IM given:    Discharge Disposition:  HOME/SELF CARE  Per UR Regulation:    If discussed at Long Length of Stay Meetings, dates discussed:    Comments:  03/05/12 1100 Angelica Seigler Leanord Hawking RN BSN

## 2012-03-05 NOTE — Progress Notes (Signed)
Patient coughed quite a bit during the night.  The cough medicine did not seem to help, although she also got Mucinex to help break up the congestion.  She was encouraged to drink plenty of fluids with the Mucinex to flush things out of her system.  Due to the cough, she did not sleep well.

## 2012-03-05 NOTE — Discharge Summary (Signed)
Physician Discharge Summary  Patient ID: Angelica [name] MRN: [id number] DOB/AGE: Aug 10, 1946 66 y.o.  Admit date: 03/02/2012 Discharge date: 03/05/2012  Primary Care Physician:  Josue Hector, MD, MD   Discharge Diagnoses:    Principal Problem:  *COPD exacerbation Active Problems:  Hyponatremia  Anemia  HTN (hypertension)  DM type 2 (diabetes mellitus, type 2) Acute respiratory failure Dehydration, resolved  Medication List  As of 03/05/2012 12:16 PM   STOP taking these medications         HYDROcodone-homatropine 5-1.5 MG/5ML syrup      LISINOPRIL PO      RAMIPRIL PO      VENTOLIN HFA 108 (90 BASE) MCG/ACT inhaler         TAKE these medications         acetaminophen 500 MG tablet   Commonly known as: TYLENOL   Take 1,000 mg by mouth daily as needed. As needed for pain      ADVAIR DISKUS 250-50 MCG/DOSE Aepb   Generic drug: Fluticasone-Salmeterol   Inhale 1 puff into the lungs every 12 (twelve) hours.      PROAIR HFA 108 (90 BASE) MCG/ACT inhaler   Generic drug: albuterol   Inhale 2 puffs into the lungs every 6 (six) hours as needed. For shortness of breath      albuterol (5 MG/ML) 0.5% nebulizer solution   Commonly known as: PROVENTIL   Take 0.5 mLs (2.5 mg total) by nebulization every 6 (six) hours as needed for wheezing.      benzonatate 100 MG capsule   Commonly known as: TESSALON   Take 1 capsule (100 mg total) by mouth 3 (three) times daily as needed for cough.      chlorpheniramine-HYDROcodone 10-8 MG/5ML Lqcr   Commonly known as: TUSSIONEX   Take 5 mLs by mouth every 12 (twelve) hours as needed.      cyclobenzaprine 10 MG tablet   Commonly known as: FLEXERIL   Take 10 mg by mouth 2 (two) times daily.      CYMBALTA PO   Take 1 capsule by mouth every morning.      DILTIAZEM HCL PO   Take 1 capsule by mouth at bedtime.      diphenhydramine-acetaminophen 25-500 MG Tabs   Commonly known as: TYLENOL PM   Take 2 tablets by mouth at bedtime  as needed. For sleep      ergocalciferol 50000 UNITS capsule   Commonly known as: VITAMIN D2   Take 50,000 Units by mouth once a week.      ESTRADIOL PO   Take 1 tablet by mouth at bedtime.      FENOFIBRATE PO   Take 1 tablet by mouth at bedtime.      glimepiride 4 MG tablet   Commonly known as: AMARYL   Take 4 mg by mouth every morning.      guaiFENesin 600 MG 12 hr tablet   Commonly known as: MUCINEX   Take 2 tablets (1,200 mg total) by mouth 2 (two) times daily.      JANUVIA PO   Take 1 tablet by mouth every morning.      levofloxacin 750 MG tablet   Commonly known as: LEVAQUIN   Take 1 tablet (750 mg total) by mouth daily.      metFORMIN 500 MG tablet   Commonly known as: GLUCOPHAGE   Take 1,000 mg by mouth 2 (two) times daily with a meal.      Nicotine 21-14-7 MG/24HR  Kit   Place 1 patch onto the skin daily.      omeprazole 20 MG capsule   Commonly known as: PRILOSEC   Take 20 mg by mouth 2 (two) times daily.      predniSONE 10 MG tablet   Commonly known as: DELTASONE   Take 40mg  po daily for 2 days then 30mg  po daily for 2 days then 20mg  po daily for 2 days then 10mg  po daily for 2 days then stop      triamterene-hydrochlorothiazide 37.5-25 MG per tablet   Commonly known as: MAXZIDE-25   Take 1 tablet by mouth every morning.      valsartan 160 MG tablet   Commonly known as: DIOVAN   Take 160 mg by mouth every morning.           Discharge Exam: Blood pressure 130/76, pulse 82, temperature 97.4 F (36.3 C), temperature source Oral, resp. rate 20, height 5\' 1"  (1.549 m), weight 85.6 kg (188 lb 11.4 oz), SpO2 98.00%. NAD CTA B S1, S2, RRR Soft, NT, BS+ No edema b/l  Disposition and Follow-up:  Patient will be discharged home today, she already has a follow up appointment with her primary doctor on 3/21  Consults:  none   Significant Diagnostic Studies:  Dg Chest 2 View  03/02/2012  *RADIOLOGY REPORT*  Clinical Data: Cough and congestion.   History of COPD appear  CHEST - 2 VIEW  Comparison: Read chest radiograph 11/27/2011  Findings: The heart, mediastinal, and hilar contours are stable. Heart size is normal.  There is slight peribronchial thickening bilaterally.  No focal airspace disease, effusion, or pneumothorax. Mild superior endplate compression deformity of T12 vertebral body appears stable.  No acute bony abnormality.  IMPRESSION: 1. Bilateral peribronchial thickening.  This can be seen in the setting of acute or chronic bronchitis, smoking, or asthma. 2. Stable mild compression deformity T12.  Original Report Authenticated By: Britta Mccreedy, M.D.    Brief H and P: For complete details please refer to admission H and P, but in brief This is a 66 year old, Caucasian female, with a past medical history of COPD, hypertension, diabetes, who was in her usual state of health till about 2 days ago, when she started having a cough with yellowish expectoration with some green sputum as well. She started getting short of breath. Had some chest pressure. Had fever. She took Tylenol. Had sweating episodes. Had minimal leg swelling, which she attributes to being chronic. Her husband was sick with similar complaints. She had nausea without any emesis. She went to her doctor's office and was given a steroid injection. She tells me, that she's had the problems with bronchitis since December. She had symptoms in December January February and required the use of steroids.   Hospital Course:  Patient was admitted to the hospital with acute exacerbation of COPD due to bronchitis. She was given supportive treatment with bronchodilators, steroids and abx.  She was given pulmonary hygiene and mucolytics.  With these measures she has significantly improved.  She has been weaned off oxygen and is maintaining her sats above 90% on ambulation.  She overall feels better and is no longer wheezing. She does still have a cough.  We will discharge her home today and  have her follow up with her primary doctor.  She was repeatedly counseled on the importance of tobacco cessation.  The remainder of her medical issues have remained stable.  Time spent on Discharge:  Signed: Luc Shammas [facility]  Pager: [phone] 03/05/2012, 12:16 PM

## 2012-03-05 NOTE — Progress Notes (Signed)
Utilization review completed.  

## 2012-03-05 NOTE — Progress Notes (Signed)
Patient transported out by RN and discharged by Jean Rosenthal, RN

## 2012-03-18 ENCOUNTER — Inpatient Hospital Stay (HOSPITAL_COMMUNITY)
Admission: AD | Admit: 2012-03-18 | Discharge: 2012-03-22 | DRG: 193 | Disposition: A | Discharge status: Home / Self Care | Payer: Medicare Other | Attending: Internal Medicine | Admitting: Internal Medicine

## 2012-03-18 ENCOUNTER — Emergency Department (HOSPITAL_COMMUNITY): Payer: Medicare Other

## 2012-03-18 ENCOUNTER — Other Ambulatory Visit: Payer: Self-pay

## 2012-03-18 ENCOUNTER — Encounter (HOSPITAL_COMMUNITY): Payer: Self-pay | Admitting: *Deleted

## 2012-03-18 DIAGNOSIS — D649 Anemia, unspecified: Secondary | ICD-10-CM | POA: Diagnosis present

## 2012-03-18 DIAGNOSIS — J449 Chronic obstructive pulmonary disease, unspecified: Secondary | ICD-10-CM | POA: Diagnosis present

## 2012-03-18 DIAGNOSIS — Z87891 Personal history of nicotine dependence: Secondary | ICD-10-CM

## 2012-03-18 DIAGNOSIS — Z9981 Dependence on supplemental oxygen: Secondary | ICD-10-CM

## 2012-03-18 DIAGNOSIS — Z9181 History of falling: Secondary | ICD-10-CM

## 2012-03-18 DIAGNOSIS — R0902 Hypoxemia: Secondary | ICD-10-CM

## 2012-03-18 DIAGNOSIS — E119 Type 2 diabetes mellitus without complications: Secondary | ICD-10-CM

## 2012-03-18 DIAGNOSIS — J441 Chronic obstructive pulmonary disease with (acute) exacerbation: Secondary | ICD-10-CM | POA: Diagnosis present

## 2012-03-18 DIAGNOSIS — Z6836 Body mass index (BMI) 36.0-36.9, adult: Secondary | ICD-10-CM

## 2012-03-18 DIAGNOSIS — Z79899 Other long term (current) drug therapy: Secondary | ICD-10-CM

## 2012-03-18 DIAGNOSIS — Z888 Allergy status to other drugs, medicaments and biological substances status: Secondary | ICD-10-CM

## 2012-03-18 DIAGNOSIS — M129 Arthropathy, unspecified: Secondary | ICD-10-CM | POA: Diagnosis present

## 2012-03-18 DIAGNOSIS — D72829 Elevated white blood cell count, unspecified: Secondary | ICD-10-CM

## 2012-03-18 DIAGNOSIS — I10 Essential (primary) hypertension: Secondary | ICD-10-CM

## 2012-03-18 DIAGNOSIS — J9691 Respiratory failure, unspecified with hypoxia: Secondary | ICD-10-CM | POA: Diagnosis present

## 2012-03-18 DIAGNOSIS — J189 Pneumonia, unspecified organism: Principal | ICD-10-CM

## 2012-03-18 DIAGNOSIS — J9600 Acute respiratory failure, unspecified whether with hypoxia or hypercapnia: Secondary | ICD-10-CM | POA: Diagnosis present

## 2012-03-18 LAB — CBC
HCT: 33.2 % — ABNORMAL LOW (ref 36.0–46.0)
HCT: 33.6 % — ABNORMAL LOW (ref 36.0–46.0)
MCH: 25.1 pg — ABNORMAL LOW (ref 26.0–34.0)
MCH: 25.1 pg — ABNORMAL LOW (ref 26.0–34.0)
MCV: 81 fL (ref 78.0–100.0)
MCV: 81.2 fL (ref 78.0–100.0)
Platelets: 419 10*3/uL — ABNORMAL HIGH (ref 150–400)
RBC: 4.1 MIL/uL (ref 3.87–5.11)
RBC: 4.14 MIL/uL (ref 3.87–5.11)
WBC: 14.4 10*3/uL — ABNORMAL HIGH (ref 4.0–10.5)
WBC: 18.2 10*3/uL — ABNORMAL HIGH (ref 4.0–10.5)

## 2012-03-18 LAB — DIFFERENTIAL
Eosinophils Absolute: 0 10*3/uL (ref 0.0–0.7)
Eosinophils Relative: 0 % (ref 0–5)
Lymphocytes Relative: 4 % — ABNORMAL LOW (ref 12–46)
Lymphs Abs: 0.6 10*3/uL — ABNORMAL LOW (ref 0.7–4.0)
Monocytes Absolute: 0.8 10*3/uL (ref 0.1–1.0)
Monocytes Relative: 6 % (ref 3–12)

## 2012-03-18 LAB — GLUCOSE, CAPILLARY
Glucose-Capillary: 164 mg/dL — ABNORMAL HIGH (ref 70–99)
Glucose-Capillary: 355 mg/dL — ABNORMAL HIGH (ref 70–99)

## 2012-03-18 LAB — COMPREHENSIVE METABOLIC PANEL
ALT: 16 U/L (ref 0–35)
BUN: 17 mg/dL (ref 6–23)
CO2: 27 mEq/L (ref 19–32)
Calcium: 9.6 mg/dL (ref 8.4–10.5)
Creatinine, Ser: 0.54 mg/dL (ref 0.50–1.10)
GFR calc Af Amer: >90 mL/min (ref >90)
GFR calc non Af Amer: >90 mL/min (ref >90)
Glucose, Bld: 225 mg/dL — ABNORMAL HIGH (ref 70–99)
Total Protein: 6.7 g/dL (ref 6.0–8.3)

## 2012-03-18 LAB — CK TOTAL AND CKMB (NOT AT ARMC)
CK, MB: 2.5 ng/mL (ref 0.3–4.0)
Relative Index: INVALID (ref 0.0–2.5)
Total CK: 30 U/L (ref 7–177)

## 2012-03-18 LAB — CREATININE, SERUM: GFR calc Af Amer: >90 mL/min (ref >90)

## 2012-03-18 MED ORDER — LISINOPRIL 10 MG PO TABS
10.0000 mg | ORAL_TABLET | Freq: Every day | ORAL | Status: DC
  Administered 2012-03-19 – 2012-03-20 (×2): 10 mg via ORAL
  Filled 2012-03-18 (×2): qty 1

## 2012-03-18 MED ORDER — SODIUM CHLORIDE 0.9 % IJ SOLN: 3.0000 mL | INTRAMUSCULAR | Status: DC | PRN

## 2012-03-18 MED ORDER — NICOTINE 21 MG/24HR TD PT24
21.0000 mg | MEDICATED_PATCH | Freq: Every day | TRANSDERMAL | Status: DC
  Administered 2012-03-19 – 2012-03-22 (×4): 21 mg via TRANSDERMAL
  Filled 2012-03-18 (×5): qty 1

## 2012-03-18 MED ORDER — VANCOMYCIN HCL IN DEXTROSE 1-5 GM/200ML-% IV SOLN
1000.0000 mg | Freq: Two times a day (BID) | INTRAVENOUS | Status: DC
  Administered 2012-03-18 – 2012-03-21 (×6): 1000 mg via INTRAVENOUS
  Filled 2012-03-18 (×9): qty 200

## 2012-03-18 MED ORDER — HYDROCOD POLST-CHLORPHEN POLST 10-8 MG/5ML PO LQCR
5.0000 mL | Freq: Two times a day (BID) | ORAL | Status: DC | PRN
  Administered 2012-03-18 – 2012-03-22 (×7): 5 mL via ORAL
  Filled 2012-03-18 (×7): qty 5

## 2012-03-18 MED ORDER — FENOFIBRATE 160 MG PO TABS
160.0000 mg | ORAL_TABLET | Freq: Every day | ORAL | Status: DC
  Administered 2012-03-18 – 2012-03-21 (×4): 160 mg via ORAL
  Filled 2012-03-18 (×5): qty 1

## 2012-03-18 MED ORDER — LINAGLIPTIN 5 MG PO TABS
5.0000 mg | ORAL_TABLET | Freq: Every day | ORAL | Status: DC
  Administered 2012-03-19 – 2012-03-22 (×4): 5 mg via ORAL
  Filled 2012-03-18 (×4): qty 1

## 2012-03-18 MED ORDER — INSULIN ASPART 100 UNIT/ML ~~LOC~~ SOLN
0.0000 [IU] | Freq: Three times a day (TID) | SUBCUTANEOUS | Status: DC
  Administered 2012-03-19 (×3): 3 [IU] via SUBCUTANEOUS
  Administered 2012-03-20: 11 [IU] via SUBCUTANEOUS
  Administered 2012-03-20: 2 [IU] via SUBCUTANEOUS
  Administered 2012-03-21: 3 [IU] via SUBCUTANEOUS
  Administered 2012-03-21: 15 [IU] via SUBCUTANEOUS
  Administered 2012-03-22: 3 [IU] via SUBCUTANEOUS

## 2012-03-18 MED ORDER — ALBUTEROL SULFATE HFA 108 (90 BASE) MCG/ACT IN AERS
2.0000 | INHALATION_SPRAY | Freq: Four times a day (QID) | RESPIRATORY_TRACT | Status: DC | PRN
  Administered 2012-03-18 (×2): 2 via RESPIRATORY_TRACT
  Filled 2012-03-18 (×2): qty 6.7

## 2012-03-18 MED ORDER — METHYLPREDNISOLONE SODIUM SUCC 40 MG IJ SOLR
40.0000 mg | Freq: Two times a day (BID) | INTRAMUSCULAR | Status: DC
  Administered 2012-03-18 – 2012-03-19 (×2): 40 mg via INTRAVENOUS
  Filled 2012-03-18 (×4): qty 1

## 2012-03-18 MED ORDER — TRIAMTERENE-HCTZ 37.5-25 MG PO TABS
1.0000 | ORAL_TABLET | ORAL | Status: DC
  Administered 2012-03-19 – 2012-03-22 (×4): 1 via ORAL
  Filled 2012-03-18 (×6): qty 1

## 2012-03-18 MED ORDER — WHITE PETROLATUM GEL
Status: AC
  Administered 2012-03-18: 23:00:00
  Filled 2012-03-18: qty 5

## 2012-03-18 MED ORDER — LEVOFLOXACIN IN D5W 750 MG/150ML IV SOLN
750.0000 mg | INTRAVENOUS | Status: AC
  Administered 2012-03-18 – 2012-03-20 (×3): 750 mg via INTRAVENOUS
  Filled 2012-03-18 (×3): qty 150

## 2012-03-18 MED ORDER — MOXIFLOXACIN HCL IN NACL 400 MG/250ML IV SOLN: 400.0000 mg | Freq: Once | INTRAVENOUS | Status: DC

## 2012-03-18 MED ORDER — HEPARIN SODIUM (PORCINE) 5000 UNIT/ML IJ SOLN
5000.0000 [IU] | Freq: Three times a day (TID) | INTRAMUSCULAR | Status: DC
  Administered 2012-03-18 – 2012-03-22 (×12): 5000 [IU] via SUBCUTANEOUS
  Filled 2012-03-18 (×14): qty 1

## 2012-03-18 MED ORDER — NICOTINE 21-14-7 MG/24HR TD KIT: 1.0000 | PACK | Freq: Every day | TRANSDERMAL | Status: DC

## 2012-03-18 MED ORDER — FLUTICASONE-SALMETEROL 250-50 MCG/DOSE IN AEPB
1.0000 | INHALATION_SPRAY | Freq: Two times a day (BID) | RESPIRATORY_TRACT | Status: DC
Start: 2012-03-18 — End: 2012-03-19
  Filled 2012-03-18: qty 14

## 2012-03-18 MED ORDER — IRBESARTAN 75 MG PO TABS
75.0000 mg | ORAL_TABLET | Freq: Every day | ORAL | Status: DC
  Administered 2012-03-19 – 2012-03-22 (×4): 75 mg via ORAL
  Filled 2012-03-18 (×4): qty 1

## 2012-03-18 MED ORDER — DOCUSATE SODIUM 100 MG PO CAPS
100.0000 mg | ORAL_CAPSULE | Freq: Every day | ORAL | Status: DC
  Administered 2012-03-18 – 2012-03-21 (×4): 100 mg via ORAL
  Filled 2012-03-18 (×5): qty 1

## 2012-03-18 MED ORDER — INSULIN ASPART 100 UNIT/ML ~~LOC~~ SOLN
3.0000 [IU] | Freq: Three times a day (TID) | SUBCUTANEOUS | Status: DC
  Administered 2012-03-19 (×2): 3 [IU] via SUBCUTANEOUS

## 2012-03-18 MED ORDER — INSULIN ASPART 100 UNIT/ML ~~LOC~~ SOLN
0.0000 [IU] | Freq: Every day | SUBCUTANEOUS | Status: DC
  Administered 2012-03-18 – 2012-03-20 (×2): 5 [IU] via SUBCUTANEOUS
  Administered 2012-03-21: 3 [IU] via SUBCUTANEOUS

## 2012-03-18 MED ORDER — DIPHENHYDRAMINE-APAP (SLEEP) 25-500 MG PO TABS: 1.0000 | ORAL_TABLET | Freq: Every evening | ORAL | Status: DC | PRN

## 2012-03-18 MED ORDER — DULOXETINE HCL 60 MG PO CPEP
60.0000 mg | ORAL_CAPSULE | Freq: Every day | ORAL | Status: DC
  Administered 2012-03-19 – 2012-03-22 (×4): 60 mg via ORAL
  Filled 2012-03-18 (×4): qty 1

## 2012-03-18 MED ORDER — TIOTROPIUM BROMIDE MONOHYDRATE 18 MCG IN CAPS
18.0000 ug | ORAL_CAPSULE | Freq: Every day | RESPIRATORY_TRACT | Status: DC
  Filled 2012-03-18: qty 5

## 2012-03-18 MED ORDER — PANTOPRAZOLE SODIUM 40 MG PO TBEC
40.0000 mg | DELAYED_RELEASE_TABLET | Freq: Every day | ORAL | Status: DC
  Administered 2012-03-19 – 2012-03-22 (×4): 40 mg via ORAL
  Filled 2012-03-18 (×4): qty 1

## 2012-03-18 MED ORDER — DIPHENHYDRAMINE HCL 25 MG PO CAPS
25.0000 mg | ORAL_CAPSULE | Freq: Every evening | ORAL | Status: DC | PRN
  Administered 2012-03-21: 25 mg via ORAL
  Filled 2012-03-18: qty 1

## 2012-03-18 MED ORDER — DILTIAZEM HCL ER BEADS 120 MG PO CP24
120.0000 mg | ORAL_CAPSULE | Freq: Every day | ORAL | Status: DC
  Administered 2012-03-18 – 2012-03-21 (×4): 120 mg via ORAL
  Filled 2012-03-18 (×5): qty 1

## 2012-03-18 MED ORDER — INSULIN GLARGINE 100 UNIT/ML ~~LOC~~ SOLN
20.0000 [IU] | Freq: Every day | SUBCUTANEOUS | Status: DC
  Administered 2012-03-18: 20 [IU] via SUBCUTANEOUS

## 2012-03-18 MED ORDER — ESTRADIOL 1 MG PO TABS
1.0000 mg | ORAL_TABLET | Freq: Every day | ORAL | Status: DC
  Administered 2012-03-18 – 2012-03-21 (×4): 1 mg via ORAL
  Filled 2012-03-18 (×5): qty 1

## 2012-03-18 MED ORDER — DEXTROSE 5 % IV SOLN
2.0000 g | Freq: Three times a day (TID) | INTRAVENOUS | Status: DC
  Administered 2012-03-18 – 2012-03-20 (×4): 2 g via INTRAVENOUS
  Filled 2012-03-18 (×7): qty 2

## 2012-03-18 MED ORDER — CYCLOBENZAPRINE HCL 10 MG PO TABS
10.0000 mg | ORAL_TABLET | Freq: Three times a day (TID) | ORAL | Status: DC | PRN
  Administered 2012-03-18 – 2012-03-21 (×5): 10 mg via ORAL
  Filled 2012-03-18 (×7): qty 1

## 2012-03-18 MED ORDER — SODIUM CHLORIDE 0.9 % IV SOLN: 250.0000 mL | INTRAVENOUS | Status: DC | PRN

## 2012-03-18 MED ORDER — SODIUM CHLORIDE 0.9 % IJ SOLN
3.0000 mL | Freq: Two times a day (BID) | INTRAMUSCULAR | Status: DC
  Administered 2012-03-19 – 2012-03-22 (×7): 3 mL via INTRAVENOUS

## 2012-03-18 MED ORDER — ACETAMINOPHEN 500 MG PO TABS
500.0000 mg | ORAL_TABLET | Freq: Every evening | ORAL | Status: DC | PRN
  Filled 2012-03-18: qty 1

## 2012-03-18 MED ORDER — PNEUMOCOCCAL VAC POLYVALENT 25 MCG/0.5ML IJ INJ
0.5000 mL | INJECTION | INTRAMUSCULAR | Status: AC
  Administered 2012-03-19: 0.5 mL via INTRAMUSCULAR
  Filled 2012-03-18: qty 0.5

## 2012-03-18 NOTE — ED Notes (Signed)
The pt is a smoker and she has been coughing up thick green sputum.  The pts husband is already a pt upstairs.

## 2012-03-18 NOTE — ED Provider Notes (Cosign Needed)
History     CSN: [id number]  Arrival date & time 03/18/12  1508   First MD Initiated Contact with Patient 03/18/12 1530      Chief Complaint  Patient presents with  . Shortness of Breath    chest pain    (Consider location/radiation/quality/duration/timing/severity/associated sxs/prior treatment) Patient is a 66 y.o. female presenting with shortness of breath. The history is provided by the patient and medical records. The history is limited by the condition of the patient.  Shortness of Breath  Associated symptoms include chest pain, cough and shortness of breath. Pertinent negatives include no fever.  65 y female with c/o bilat cp, cough with yellow sputum, chills, and soaking diaphoresis since yest.  Says she fell onto right side approx. 1 week ago.  No n/v/leg pain or swelling. No hx long trip, travel or recent surgery.  No hx copd, chf, home oxygen use.  Allergy to morphine and rocephin. Says can take vicodin.   Level 5 for severe illness. resp distress.  Says has been recently on abxs for resp infection.  Past Medical History  Diagnosis Date  . Diabetes mellitus   . Hypertension   . COPD (chronic obstructive pulmonary disease)   . Asthma   . Arthritis     Past Surgical History  Procedure Date  . Cesarean section   . Abdominal hysterectomy   . Total knee arthroplasty   . Back surgery   . Tonsillectomy   . Foot surgery   . Tubal ligation   . Cholecystectomy     History reviewed. No pertinent family history.  History  Substance Use Topics  . Smoking status: Current Everyday Smoker -- 1.0 packs/day    Types: Cigarettes  . Smokeless tobacco: Not on file  . Alcohol Use: No    OB History    Grav Para Term Preterm Abortions TAB SAB Ect Mult Living                  Review of Systems  Constitutional: Positive for chills. Negative for fever.  Respiratory: Positive for cough and shortness of breath. Negative for chest tightness.   Cardiovascular: Positive for  chest pain. Negative for leg swelling.  Gastrointestinal: Negative for nausea and vomiting.  Musculoskeletal: Negative for back pain.  Skin: Negative for rash.  Neurological: Negative for headaches.  Hematological: Does not bruise/bleed easily.  Psychiatric/Behavioral: Negative for confusion.    Allergies  Quinine derivatives; Morphine and related; and Rocephin  Home Medications   Current Outpatient Rx  Name Route Sig Dispense Refill  . ALBUTEROL SULFATE HFA 108 (90 BASE) MCG/ACT IN AERS Inhalation Inhale 2 puffs into the lungs every 6 (six) hours as needed. For shortness of breath    . ALBUTEROL SULFATE (5 MG/ML) 0.5% IN NEBU Nebulization Take 0.5 mLs (2.5 mg total) by nebulization every 6 (six) hours as needed for wheezing. 20 mL 0  . CYCLOBENZAPRINE HCL 10 MG PO TABS Oral Take 10 mg by mouth 2 (two) times daily.    . ERGOCALCIFEROL 50000 UNITS PO CAPS Oral Take 50,000 Units by mouth once a week.    Marland Kitchen FLUTICASONE-SALMETEROL 250-50 MCG/DOSE IN AEPB Inhalation Inhale 1 puff into the lungs every 12 (twelve) hours.    Marland Kitchen GLIMEPIRIDE 4 MG PO TABS Oral Take 4 mg by mouth every morning.    . GUAIFENESIN ER 600 MG PO TB12 Oral Take 2 tablets (1,200 mg total) by mouth 2 (two) times daily. 14 tablet 0  . NICOTINE 21-14-7 MG/24HR  TD KIT Transdermal Place 1 patch onto the skin daily. 56 each 0  . OMEPRAZOLE 20 MG PO CPDR Oral Take 20 mg by mouth 2 (two) times daily.    Marland Kitchen PREDNISONE 10 MG PO TABS  Take 40mg  po daily for 2 days then 30mg  po daily for 2 days then 20mg  po daily for 2 days then 10mg  po daily for 2 days then stop 20 tablet 0  . JANUVIA PO Oral Take 1 tablet by mouth every morning.    . TRIAMTERENE-HCTZ 37.5-25 MG PO TABS Oral Take 1 tablet by mouth every morning.    Marland Kitchen VALSARTAN 160 MG PO TABS Oral Take 160 mg by mouth every morning.      BP 161/50  Pulse 107  Temp(Src) 98.2 F (36.8 C) (Oral)  Resp 28  SpO2 90%  Physical Exam  Vitals reviewed. Constitutional: She is oriented  to person, place, and time. She appears distressed.       Obese, anxious, mild resp distress.   HENT:  Head: Normocephalic and atraumatic.  Eyes: Conjunctivae and EOM are normal.  Neck: Normal range of motion.  Cardiovascular:  No murmur heard.      tachycardia  Pulmonary/Chest: She is in respiratory distress. She has rales. She exhibits tenderness.       Incr. Rr, rales on left, splinting. Right cw ttp laterally in lower ribs.  Abdominal: Soft. She exhibits no distension. There is no tenderness.  Musculoskeletal: Normal range of motion. She exhibits no edema and no tenderness.  Neurological: She is alert and oriented to person, place, and time.  Skin: Skin is dry.  Psychiatric: Judgment and thought content normal.       anxious    ED Course  Procedures (including critical care time) 55 y female with chills, sweating, sob, cp, hypoxia on ra. Suspect pneumonia.   Will check cxr, labs.  No suggestion pe, chf, acs, copd.   Labs Reviewed  CBC  DIFFERENTIAL  COMPREHENSIVE METABOLIC PANEL  CK TOTAL AND CKMB   No results found.   No diagnosis found.  ED ECG REPORT   Date: 03/18/2012  EKG Time: 4:01 PM  Rate: 108   Rhythm: sinus tachycardia,  unchanged from previous tracings  Axis: right  Intervals:right bundle branch block  ST&T Change: nonspecific  Narrative Interpretation: st with rbbb, known, and nonspecific tw changes     5:03 PM I spoke with [facility].  He will admit. He asked me to start avelox.          MDM  bilat cp, cough, hypoxia, resp distress Suspected HCAP         Cheri Guppy, MD 03/18/12 1704

## 2012-03-18 NOTE — Progress Notes (Signed)
  Infectious Disease Pharmacy     Total Antibiotics Day 0         Vancomycin   3/31--->          Levofloxacin   3/31--->           Aztreonam   3/31--->    Angelica [name] is an 66 y.o. female who presented to [facility] on 03/18/2012 with a chief complaint of  Chief Complaint  Patient presents with  . Shortness of Breath    chest pain     Filed Vitals:   03/18/12 1811  BP: 139/52  Pulse: 75  Temp: 98.9 F (37.2 C)  Resp:     Lab 03/18/12 1524  NA 138  K 4.1  CL 98  CO2 27  GLUCOSE 225*  BUN 17  CREATININE 0.54  CALCIUM 9.6  MG --  PHOS --    Lab 03/18/12 1524  WBC 14.4*    Past Medical History  Diagnosis Date  . Diabetes mellitus   . Hypertension   . COPD (chronic obstructive pulmonary disease)   . Asthma   . Arthritis      Assessment: 66 year old woman with recent admission to [facility] is admitted to [facility] for HCAP.  Scr 0.54, estimated creatinine clearance 60-70 mL/min.    Plan:  Aztreonam 2g IV q8h, Levofloxacin 750mg  IV daily, Vancomycin 1g IV q12.  Goal Vancomycin trough is 15-20mg /L.  Will monitor cultures, renal function, and clinical course with medical team  Celedonio Miyamoto, PharmD, BCPS [phone]

## 2012-03-18 NOTE — ED Notes (Signed)
Attempted report x 1, name and number to secretary 

## 2012-03-18 NOTE — ED Notes (Signed)
Pt reports progressively worsening sob over the last week, states she fell last week hitting left side of rib cage. Pt reports discomfort with movement and palpation to left rib cage. Pt reports saw PCP and was given inhaler and steriods earlier in the week for SOB, O2 sats in the 80s at PCP. Pt with hx of COPD. Pt reports last night she started having severe sob, pt able to speak in complete sentences but RR between 24-30. Pt reports increased sob with ambulation and laying flat. Reports productive cough.

## 2012-03-18 NOTE — H&P (Addendum)
Hospital Admission Note Date: 03/18/2012  PCP: Josue Hector, MD, MD  Chief Complaint:cough and SOB  History of Present Illness: This is a 67 year old female with past medical history of COPD, former smoker quit about 2 weeks ago, hypertension, diabetes that comes in for cough and shortness of breath. She relates to start 3 days prior to admission. She started getting cough productive. She fell in the bathtub about a week ago prior to admission. And she hit her left side of the chest. She relates is tender to palpation. She relates that 1 day prior to admission she was soaking wet in her bed in relation to her temperature but had no fevers at bedtime. She relates prior to admission she walked more than 300 feet she cannot even walk to the bathroom without getting short of breath. She also relates she started wheezing for 24 hours prior to admission. Which has progressively she feels like her chest is tight. She relates no nausea vomiting diarrhea.  Allergies: Quinine derivatives; Morphine and related; and Rocephin Past Medical History  Diagnosis Date  . Diabetes mellitus   . Hypertension   . COPD (chronic obstructive pulmonary disease)   . Asthma   . Arthritis    Prior to Admission medications   Medication Sig Start Date End Date Taking? Authorizing Provider  albuterol (PROAIR HFA) 108 (90 BASE) MCG/ACT inhaler Inhale 2 puffs into the lungs every 6 (six) hours as needed. For shortness of breath   Yes Historical Provider, MD  albuterol (PROVENTIL) (5 MG/ML) 0.5% nebulizer solution Take 0.5 mLs (2.5 mg total) by nebulization every 6 (six) hours as needed for wheezing. 03/05/12 03/05/13 Yes Erick Blinks, MD  benzonatate (TESSALON) 100 MG capsule Take 100 mg by mouth 3 (three) times daily as needed. For cough   Yes Historical Provider, MD  chlorpheniramine-HYDROcodone (TUSSIONEX) 10-8 MG/5ML LQCR Take 5 mLs by mouth every 12 (twelve) hours as needed. For cough   Yes Historical Provider,  MD  cyclobenzaprine (FLEXERIL) 10 MG tablet Take 10 mg by mouth 3 (three) times daily as needed. For muscle spasms   Yes Historical Provider, MD  diltiazem (TIAZAC) 120 MG 24 hr capsule Take 120 mg by mouth at bedtime.   Yes Historical Provider, MD  diphenhydramine-acetaminophen (TYLENOL PM) 25-500 MG TABS Take 1 tablet by mouth at bedtime as needed. For sleep   Yes Historical Provider, MD  docusate sodium (COLACE) 100 MG capsule Take 100 mg by mouth at bedtime.   Yes Historical Provider, MD  DULoxetine (CYMBALTA) 60 MG capsule Take 60 mg by mouth daily.   Yes Historical Provider, MD  ergocalciferol (VITAMIN D2) 50000 UNITS capsule Take 50,000 Units by mouth once a week. On Saturdays   Yes Historical Provider, MD  estradiol (ESTRACE) 1 MG tablet Take 1 mg by mouth at bedtime.   Yes Historical Provider, MD  fenofibrate 160 MG tablet Take 160 mg by mouth at bedtime.   Yes Historical Provider, MD  Fluticasone-Salmeterol (ADVAIR DISKUS) 250-50 MCG/DOSE AEPB Inhale 1 puff into the lungs every 12 (twelve) hours.   Yes Historical Provider, MD  glimepiride (AMARYL) 4 MG tablet Take 4 mg by mouth daily. With main meal of the day   Yes Historical Provider, MD  guaiFENesin (MUCINEX) 600 MG 12 hr tablet Take 1,200 mg by mouth 2 (two) times daily as needed. For chest congestion   Yes Historical Provider, MD  ibuprofen (ADVIL,MOTRIN) 200 MG tablet Take 400 mg by mouth every 6 (six) hours as needed. For pain  Yes Historical Provider, MD  lisinopril (PRINIVIL,ZESTRIL) 10 MG tablet Take 10 mg by mouth daily.   Yes Historical Provider, MD  metFORMIN (GLUMETZA) 500 MG (MOD) 24 hr tablet Take 500 mg by mouth 2 (two) times daily with a meal.   Yes Historical Provider, MD  omeprazole (PRILOSEC) 20 MG capsule Take 20 mg by mouth 2 (two) times daily.   Yes Historical Provider, MD  Phenylephrine-Acetaminophen (QC SINUS PAIN RELIEF) 5-325 MG TABS Take 1 tablet by mouth. For sinus congestion   Yes Historical Provider, MD    SitaGLIPtin Phosphate (JANUVIA PO) Take 1 tablet by mouth every morning.   Yes Historical Provider, MD  triamterene-hydrochlorothiazide (MAXZIDE-25) 37.5-25 MG per tablet Take 1 tablet by mouth every morning.   Yes Historical Provider, MD  valsartan (DIOVAN) 160 MG tablet Take 160 mg by mouth every morning.   Yes Historical Provider, MD  Nicotine 21-14-7 MG/24HR KIT Place 1 patch onto the skin daily. 03/05/12   Erick Blinks, MD  predniSONE (DELTASONE) 10 MG tablet Take 40mg  po daily for 2 days then 30mg  po daily for 2 days then 20mg  po daily for 2 days then 10mg  po daily for 2 days then stop 03/05/12   Erick Blinks, MD   Past Surgical History  Procedure Date  . Cesarean section   . Abdominal hysterectomy   . Total knee arthroplasty   . Back surgery   . Tonsillectomy   . Foot surgery   . Tubal ligation   . Cholecystectomy    Family History  Problem Relation Age of Onset  . Pneumonia Mother   . Lupus Mother   . Lung cancer Father    History   Social History  . Marital Status: Married    Spouse Name: N/A    Number of Children: N/A  . Years of Education: N/A   Occupational History  . Not on file.   Social History Main Topics  . Smoking status: Current Everyday Smoker -- 1.0 packs/day for 30 years    Types: Cigarettes  . Smokeless tobacco: Not on file  . Alcohol Use: No  . Drug Use: No  . Sexually Active: No   Other Topics Concern  . Not on file   Social History Narrative  . No narrative on file    REVIEW OF SYSTEMS:  Constitutional:  No weight loss, night sweats, Fevers, chills, fatigue.  HEENT:  No headaches, Difficulty swallowing,Tooth/dental problems,Sore throat,  No sneezing, itching, ear ache, nasal congestion, post nasal drip,  Cardio-vascular:  No chest pain, Orthopnea, PND, swelling in lower extremities, anasarca, dizziness, palpitations  GI:  No heartburn, indigestion, abdominal pain, nausea, vomiting, diarrhea, change in bowel habits, loss of  appetite  Resp:   No coughing up of blood.No change in color of mucus.No chest wall deformity  Skin:  no rash or lesions.  GU:  no dysuria, change in color of urine, no urgency or frequency. No flank pain.  Musculoskeletal:  No joint pain or swelling. No decreased range of motion. No back pain.  Psych:  No change in mood or affect. No depression or anxiety. No memory loss.   Physical Exam: Filed Vitals:   03/18/12 1514 03/18/12 1519  BP:  161/50  Pulse:  107  Temp:  98.2 F (36.8 C)  TempSrc:  Oral  Resp:  28  SpO2: 89% 90%   No intake or output data in the 24 hours ending 03/18/12 1737 BP 161/50  Pulse 107  Temp(Src) 98.2 F (36.8 C) (  Oral)  Resp 28  SpO2 90%  General Appearance:    Alert, cooperative, no distress, appears stated age, obese   Head:    Normocephalic, without obvious abnormality, atraumatic  Eyes:    PERRL, conjunctiva/corneas clear, EOM's intact, fundi    benign, both eyes  Ears:    Normal TM's and external ear canals, both ears  Nose:   Nares normal, septum midline, mucosa normal, no drainage    or sinus tenderness  Throat:   Lips, mucosa, and tongue normal; teeth and gums normal  Neck:   Supple, symmetrical, trachea midline, no adenopathy;    thyroid:  no enlargement/tenderness/nodules; no carotid   bruit or JVD  Back:     Symmetric, no curvature, ROM normal, no CVA tenderness  Lungs:     unlabored breathing unable to speak in full sentences with moderate air movement and mild wheezing bilaterally. She also has a crackling on her left lower lobe .  Chest Wall:    No tenderness or deformity   Heart:    Regular rate and rhythm, S1 and S2 normal, no murmur, rub   or gallop     Abdomen:     Soft, non-tender, bowel sounds active all four quadrants,    no masses, no organomegaly        Extremities:   Extremities normal, atraumatic, no cyanosis or edema  Pulses:   2+ and symmetric all extremities  Skin:   Skin color, texture, turgor normal, no rashes or  lesions  Lymph nodes:   Cervical, supraclavicular, and axillary nodes normal  Neurologic:   CNII-XII intact, normal strength, sensation and reflexes    throughout   Lab results:  Basename 03/18/12 1524  NA 138  K 4.1  CL 98  CO2 27  GLUCOSE 225*  BUN 17  CREATININE 0.54  CALCIUM 9.6  MG --  PHOS --    Basename 03/18/12 1524  AST 13  ALT 16  ALKPHOS 55  BILITOT 0.4  PROT 6.7  ALBUMIN 3.2*   No results found for this basename: LIPASE:2,AMYLASE:2 in the last 72 hours  Basename 03/18/12 1524  WBC 14.4*  NEUTROABS 12.9*  HGB 10.3*  HCT 33.2*  MCV 81.0  PLT 408*    Basename 03/18/12 1524  CKTOTAL 30  CKMB 2.5  CKMBINDEX --  TROPONINI --   No components found with this basename: POCBNP:3 No results found for this basename: DDIMER:2 in the last 72 hours No results found for this basename: HGBA1C:2 in the last 72 hours No results found for this basename: CHOL:2,HDL:2,LDLCALC:2,TRIG:2,CHOLHDL:2,LDLDIRECT:2 in the last 72 hours No results found for this basename: TSH,T4TOTAL,FREET3,T3FREE,THYROIDAB in the last 72 hours No results found for this basename: VITAMINB12:2,FOLATE:2,FERRITIN:2,TIBC:2,IRON:2,RETICCTPCT:2 in the last 72 hours Imaging results:  Dg Chest 2 View  03/02/2012  *RADIOLOGY REPORT*  Clinical Data: Cough and congestion.  History of COPD appear  CHEST - 2 VIEW  Comparison: Read chest radiograph 11/27/2011  Findings: The heart, mediastinal, and hilar contours are stable. Heart size is normal.  There is slight peribronchial thickening bilaterally.  No focal airspace disease, effusion, or pneumothorax. Mild superior endplate compression deformity of T12 vertebral body appears stable.  No acute bony abnormality.  IMPRESSION: 1. Bilateral peribronchial thickening.  This can be seen in the setting of acute or chronic bronchitis, smoking, or asthma. 2. Stable mild compression deformity T12.  Original Report Authenticated By: Britta Mccreedy, M.D.   Dg Chest Port 1  View  03/18/2012  *RADIOLOGY REPORT*  Clinical  Data: Chest and rib pain.  Shortness of breath.  Cough and fever.  PORTABLE CHEST - 1 VIEW  Comparison: Chest x-ray 03/02/2012.  Findings: Lung volumes are normal.  There is an ill-defined opacity partially obscuring the left heart border, that may represent a small region of consolidation in the lingula.  Mild diffuse interstitial prominence is noted, with thickening of the central bronchi.  No definite pleural effusions.  Pulmonary vasculature and the cardiac silhouette are within normal limits. No displaced rib fractures or pneumothorax identified. Atherosclerotic calcifications within the arch of the aorta.  IMPRESSION: 1.  Findings, as above, concerning for bronchitis.  There may be an early area of airspace consolidation in the lingula that could indicate an early bronchopneumonia.  Clinical correlation is recommended. 2.  Ath Other results: EKG: Sinus tachycardia with right bundle branch block normal axis no T wave inversions. .   Patient Active Hospital Problem List: Respiratory failure with hypoxia (03/18/2012) -I will admit her to the telemetry floor, started on oxygen, steroids,  inhalers and broad-spectrum antibiotics, monitor her saturations. She is not on Spiriva so we'll start her on Spiriva for her COPD.  Healthcare-associated pneumonia (03/18/2012): -She was recently discharged from [facility]. So we'll have to cover for both Health care associated pneumonia and possible UTI we'll start her on vancomycin, aztreonam (due to [facility] allergy), and Levaquin. We'll get sputum cultures and blood cultures. She also had her fall and hit her left side and this could be pneumonia due to decreased inspiration. She does have a white count and fevers at home. Here in the hospital she soak the bed as she was chilly. Her WBC might go due to steroids. So will monitor the for fevers.  Also malignancy in Differential diagnosis but unlikely.  Anemia  (03/02/2012):  -her last hemoglobin Jeani Hawking was 10. She relates no melena. This will need to be followed up as an outpatient.   HTN (hypertension) (03/02/2012) -this time her blood pressure is high. We'll continue some of her medications. She's currently on an ARB and ACE inhibitor. We'll DC the ARB and continue the ACE inhibitor. Her creatinine is currently stable.  DM type 2 (diabetes mellitus, type 2) (03/02/2012)  -I will go ahead and stop her metformin she's coming into the hospital. We'll start her on low-dose Lantus and sliding scale insulin. Monitor his CBGs a.c. and at bedtime. We'll also check a hemoglobin A1c. We'll continue her Januvia.     COPD (chronic obstructive pulmonary disease) (03/18/2012) -we'll start her on inhalers, steroids, and antibiotics. At home she is not on a continuous current occasion. We'll start her on Spiriva daily, she will need to followup with a pulmonologist as an outpatient already discussed this with the patient..    Code Status: Full code Family Communication: Daughter [phone]  Marinda Elk M.D. [facility] [phone] 03/18/2012, 5:37 PM

## 2012-03-18 NOTE — ED Notes (Signed)
Will transport pt to floor at 1930

## 2012-03-18 NOTE — ED Notes (Signed)
O2 sats dropped to 80 with ambulation to restroom

## 2012-03-18 NOTE — ED Notes (Signed)
Pt c/o sob since yesterday.  She fell one week ago and she has some lt lower rib pain.  More exertionally sob for the past 2 days.  She also has some posterior neck pain since yesterday.

## 2012-03-18 NOTE — ED Notes (Addendum)
Increased wheezing noted, will look at orders

## 2012-03-18 NOTE — ED Notes (Signed)
Spoke with admitting, will hold off on antibiotics till his orders are completed, admitting states they may not do avelox.

## 2012-03-18 NOTE — Progress Notes (Signed)
Pt had some PVCs> Claiborne Billings was notified. Pt was unremarkable. RN will continue to monitor.

## 2012-03-19 LAB — COMPREHENSIVE METABOLIC PANEL
Alkaline Phosphatase: 52 U/L (ref 39–117)
BUN: 20 mg/dL (ref 6–23)
CO2: 31 mEq/L (ref 19–32)
Chloride: 94 mEq/L — ABNORMAL LOW (ref 96–112)
Creatinine, Ser: 0.59 mg/dL (ref 0.50–1.10)
GFR calc Af Amer: >90 mL/min (ref >90)
GFR calc non Af Amer: >90 mL/min (ref >90)
Glucose, Bld: 173 mg/dL — ABNORMAL HIGH (ref 70–99)
Potassium: 5.1 mEq/L (ref 3.5–5.1)
Total Bilirubin: 0.4 mg/dL (ref 0.3–1.2)

## 2012-03-19 LAB — GLUCOSE, CAPILLARY
Glucose-Capillary: 174 mg/dL — ABNORMAL HIGH (ref 70–99)
Glucose-Capillary: 202 mg/dL — ABNORMAL HIGH (ref 70–99)

## 2012-03-19 LAB — CBC
HCT: 33.4 % — ABNORMAL LOW (ref 36.0–46.0)
Hemoglobin: 10.2 g/dL — ABNORMAL LOW (ref 12.0–15.0)
MCV: 81.3 fL (ref 78.0–100.0)
RDW: 16.8 % — ABNORMAL HIGH (ref 11.5–15.5)
WBC: 15.8 10*3/uL — ABNORMAL HIGH (ref 4.0–10.5)

## 2012-03-19 LAB — MAGNESIUM: Magnesium: 1.8 mg/dL (ref 1.5–2.5)

## 2012-03-19 LAB — EXPECTORATED SPUTUM ASSESSMENT W GRAM STAIN, RFLX TO RESP C

## 2012-03-19 LAB — LEGIONELLA ANTIGEN, URINE: Legionella Antigen, Urine: NEGATIVE

## 2012-03-19 MED ORDER — ALBUTEROL SULFATE (5 MG/ML) 0.5% IN NEBU
2.5000 mg | INHALATION_SOLUTION | Freq: Four times a day (QID) | RESPIRATORY_TRACT | Status: DC
  Administered 2012-03-19 – 2012-03-22 (×10): 2.5 mg via RESPIRATORY_TRACT
  Filled 2012-03-19 (×11): qty 0.5

## 2012-03-19 MED ORDER — SODIUM POLYSTYRENE SULFONATE 15 GM/60ML PO SUSP
30.0000 g | Freq: Once | ORAL | Status: AC
  Administered 2012-03-19: 30 g via ORAL
  Filled 2012-03-19 (×2): qty 120

## 2012-03-19 MED ORDER — PREDNISONE 20 MG PO TABS
40.0000 mg | ORAL_TABLET | Freq: Every day | ORAL | Status: DC
  Administered 2012-03-20 – 2012-03-22 (×3): 40 mg via ORAL
  Filled 2012-03-19 (×4): qty 2

## 2012-03-19 MED ORDER — OXYCODONE-ACETAMINOPHEN 5-325 MG PO TABS: 1.0000 | ORAL_TABLET | ORAL | Status: DC | PRN

## 2012-03-19 MED ORDER — IPRATROPIUM BROMIDE 0.02 % IN SOLN
0.5000 mg | Freq: Four times a day (QID) | RESPIRATORY_TRACT | Status: DC
  Administered 2012-03-19 – 2012-03-22 (×10): 0.5 mg via RESPIRATORY_TRACT
  Filled 2012-03-19 (×11): qty 2.5

## 2012-03-19 MED ORDER — INSULIN GLARGINE 100 UNIT/ML ~~LOC~~ SOLN
15.0000 [IU] | Freq: Every day | SUBCUTANEOUS | Status: DC
  Administered 2012-03-19: 15 [IU] via SUBCUTANEOUS

## 2012-03-19 NOTE — Progress Notes (Signed)
03/19/2012 Tayron Hunnell SPARKS Case Management Note [phone]  Utilization review completed.  

## 2012-03-19 NOTE — Progress Notes (Signed)
PATIENT DETAILS Name: Angelica [name] Age: 66 y.o. Sex: female Date of Birth: [birth year] Admit Date: 03/18/2012 [id number],ELFYBOF ROBERT, MD, MD  Subjective: Feeling much better today-SOB better, cough better as well.  Objective: Vital signs in last 24 hours: Filed Vitals:   03/18/12 1947 03/18/12 2232 03/19/12 0619 03/19/12 0936  BP: 153/67 133/73 116/52 128/66  Pulse: 109 99 90   Temp: 99.1 F (37.3 C) 98.9 F (37.2 C) 98.3 F (36.8 C)   TempSrc: Oral Oral Oral   Resp:  20 20   Height: 5\' 1"  (1.549 m)     Weight: 87.091 kg (192 lb)     SpO2: 93% 95% 96%     Weight change:   Body mass index is 36.28 kg/(m^2).  Intake/Output from previous day:  Intake/Output Summary (Last 24 hours) at 03/19/12 1255 Last data filed at 03/19/12 0936  Gross per 24 hour  Intake      6 ml  Output    400 ml  Net   -394 ml    PHYSICAL EXAM: Gen Exam: Awake and alert with clear speech.   Neck: Supple, No JVD.   Chest: B/L Clear.   CVS: S1 S2 Regular, no murmurs.  Abdomen: soft, BS +, non tender, non distended.  Extremities: no edema, lower extremities warm to touch. Neurologic: Non Focal.   Skin: No Rash.   Wounds: N/A.    CONSULTS:  None  LAB RESULTS: CBC  Lab 03/19/12 0650 03/18/12 2006 03/18/12 1524  WBC 15.8* 18.2* 14.4*  HGB 10.2* 10.4* 10.3*  HCT 33.4* 33.6* 33.2*  PLT 451* 419* 408*  MCV 81.3 81.2 81.0  MCH 24.8* 25.1* 25.1*  MCHC 30.5 31.0 31.0  RDW 16.8* 16.8* 16.8*  LYMPHSABS -- -- 0.6*  MONOABS -- -- 0.8  EOSABS -- -- 0.0  BASOSABS -- -- 0.0  BANDABS -- -- --    Chemistries   Lab 03/19/12 0650 03/18/12 2006 03/18/12 1524  NA 133* -- 138  K 5.1 -- 4.1  CL 94* -- 98  CO2 31 -- 27  GLUCOSE 173* -- 225*  BUN 20 -- 17  CREATININE 0.59 0.56 0.54  CALCIUM 9.9 -- 9.6  MG 1.8 -- --    GFR Estimated Creatinine Clearance: 69.3 ml/min (by C-G formula based on Cr of 0.59).  Coagulation profile No results found for this basename: INR:5,PROTIME:5 in the  last 168 hours  Cardiac Enzymes  Lab 03/18/12 1524  CKMB 2.5  TROPONINI --  MYOGLOBIN --    No components found with this basename: POCBNP:3 No results found for this basename: DDIMER:2 in the last 72 hours No results found for this basename: HGBA1C:2 in the last 72 hours No results found for this basename: CHOL:2,HDL:2,LDLCALC:2,TRIG:2,CHOLHDL:2,LDLDIRECT:2 in the last 72 hours No results found for this basename: TSH,T4TOTAL,FREET3,T3FREE,THYROIDAB in the last 72 hours No results found for this basename: VITAMINB12:2,FOLATE:2,FERRITIN:2,TIBC:2,IRON:2,RETICCTPCT:2 in the last 72 hours No results found for this basename: LIPASE:2,AMYLASE:2 in the last 72 hours  Urine Studies No results found for this basename: UACOL:2,UAPR:2,USPG:2,UPH:2,UTP:2,UGL:2,UKET:2,UBIL:2,UHGB:2,UNIT:2,UROB:2,ULEU:2,UEPI:2,UWBC:2,URBC:2,UBAC:2,CAST:2,CRYS:2,UCOM:2,BILUA:2 in the last 72 hours  MICROBIOLOGY: No results found for this or any previous visit (from the past 240 hour(s)).  RADIOLOGY STUDIES/RESULTS: Dg Chest 2 View  03/02/2012  *RADIOLOGY REPORT*  Clinical Data: Cough and congestion.  History of COPD appear  CHEST - 2 VIEW  Comparison: Read chest radiograph 11/27/2011  Findings: The heart, mediastinal, and hilar contours are stable. Heart size is normal.  There is slight peribronchial thickening bilaterally.  No focal airspace  disease, effusion, or pneumothorax. Mild superior endplate compression deformity of T12 vertebral body appears stable.  No acute bony abnormality.  IMPRESSION: 1. Bilateral peribronchial thickening.  This can be seen in the setting of acute or chronic bronchitis, smoking, or asthma. 2. Stable mild compression deformity T12.  Original Report Authenticated By: Britta Mccreedy, M.D.   Dg Chest Port 1 View  03/18/2012  *RADIOLOGY REPORT*  Clinical Data: Chest and rib pain.  Shortness of breath.  Cough and fever.  PORTABLE CHEST - 1 VIEW  Comparison: Chest x-ray 03/02/2012.  Findings:  Lung volumes are normal.  There is an ill-defined opacity partially obscuring the left heart border, that may represent a small region of consolidation in the lingula.  Mild diffuse interstitial prominence is noted, with thickening of the central bronchi.  No definite pleural effusions.  Pulmonary vasculature and the cardiac silhouette are within normal limits. No displaced rib fractures or pneumothorax identified. Atherosclerotic calcifications within the arch of the aorta.  IMPRESSION: 1.  Findings, as above, concerning for bronchitis.  There may be an early area of airspace consolidation in the lingula that could indicate an early bronchopneumonia.  Clinical correlation is recommended. 2.  Atherosclerosis.  Original Report Authenticated By: Florencia Reasons, M.D.    MEDICATIONS: Scheduled Meds:   . ipratropium  0.5 mg Nebulization Q6H   And  . albuterol  2.5 mg Nebulization Q6H  . aztreonam  2 g Intravenous Q8H  . diltiazem  120 mg Oral QHS  . docusate sodium  100 mg Oral QHS  . DULoxetine  60 mg Oral Daily  . estradiol  1 mg Oral QHS  . fenofibrate  160 mg Oral QHS  . heparin  5,000 Units Subcutaneous Q8H  . insulin aspart  0-15 Units Subcutaneous TID WC  . insulin aspart  0-5 Units Subcutaneous QHS  . insulin aspart  3 Units Subcutaneous TID WC  . insulin glargine  20 Units Subcutaneous QHS  . irbesartan  75 mg Oral Daily  . levofloxacin (LEVAQUIN) IV  750 mg Intravenous Q24H  . linagliptin  5 mg Oral Daily  . lisinopril  10 mg Oral Daily  . methylPREDNISolone (SOLU-MEDROL) injection  40 mg Intravenous Q12H  . nicotine  21 mg Transdermal Daily  . pantoprazole  40 mg Oral Q1200  . pneumococcal 23 valent vaccine  0.5 mL Intramuscular Tomorrow-1000  . sodium chloride  3 mL Intravenous Q12H  . triamterene-hydrochlorothiazide  1 each Oral BH-q7a  . vancomycin  1,000 mg Intravenous Q12H  . white petrolatum      . DISCONTD: Fluticasone-Salmeterol  1 puff Inhalation Q12H  . DISCONTD:  moxifloxacin  400 mg Intravenous Once  . DISCONTD: moxifloxacin  400 mg Intravenous Once  . DISCONTD: Nicotine  1 patch Transdermal Daily  . DISCONTD: tiotropium  18 mcg Inhalation Daily   Continuous Infusions:  PRN Meds:.sodium chloride, acetaminophen, albuterol, chlorpheniramine-HYDROcodone, cyclobenzaprine, diphenhydrAMINE, oxyCODONE-acetaminophen, sodium chloride, DISCONTD: diphenhydramine-acetaminophen  Antibiotics: Anti-infectives     Start     Dose/Rate Route Frequency Ordered Stop   03/18/12 2200   aztreonam (AZACTAM) 2 g in dextrose 5 % 50 mL IVPB        2 g 100 mL/hr over 30 Minutes Intravenous 3 times per day 03/18/12 1939 03/26/12 2159   03/18/12 2100   vancomycin (VANCOCIN) IVPB 1000 mg/200 mL premix        1,000 mg 200 mL/hr over 60 Minutes Intravenous Every 12 hours 03/18/12 1951     03/18/12 2000  levofloxacin (LEVAQUIN) IVPB 750 mg        750 mg 100 mL/hr over 90 Minutes Intravenous Every 24 hours 03/18/12 1939 03/21/12 1959   03/18/12 1715   moxifloxacin (AVELOX) IVPB 400 mg  Status:  Discontinued        400 mg 250 mL/hr over 60 Minutes Intravenous  Once 03/18/12 1703 03/18/12 1920   03/18/12 1700   moxifloxacin (AVELOX) IVPB 400 mg  Status:  Discontinued        400 mg 250 mL/hr over 60 Minutes Intravenous  Once 03/18/12 1649 03/18/12 1656          Assessment/Plan: Patient Active Hospital Problem List: Respiratory failure with hypoxia -likely secondary to COPD Exacerbation -continue with nebulized bronchodilators -Change solumedrol to prednisone -continue with Empiric Antibiotics -assess home O2 requirement -titrate off oxygen  -check BNP with am labs  ?HCAP vs Bronchopneumonia -continue with empiric antibiotics -will likely narrow to just levaquin or doxycycline at discharge  DM -CBG's controlled -continue to hold off on resuming oral hypoglycemics this admit-will resume on discharge -continue lantus and SSI  HTN -BP Controlled -Continue  with Avapro,Lisinopril, Diltiazem and Maxzide  Leukocytosis -secondary to Steroids/PNA -continue to monitor -afebrile and non-toxic looking  Anemia H/H close to usual baseline Monitor periodically  Morbid Obesity -counselled extensively -re importance of loosing weight  Disposition: Remain inpatient  DVT Prophylaxis: Prophylactic heparin  Code Status: Full Code  Maretta Bees, MD. 03/19/2012, 12:55 PM

## 2012-03-20 ENCOUNTER — Inpatient Hospital Stay (HOSPITAL_COMMUNITY): Payer: Medicare Other

## 2012-03-20 LAB — CBC
HCT: 30.4 % — ABNORMAL LOW (ref 36.0–46.0)
Hemoglobin: 9.4 g/dL — ABNORMAL LOW (ref 12.0–15.0)
MCH: 25.4 pg — ABNORMAL LOW (ref 26.0–34.0)
MCHC: 30.9 g/dL (ref 30.0–36.0)
MCV: 82.2 fL (ref 78.0–100.0)

## 2012-03-20 LAB — GLUCOSE, CAPILLARY
Glucose-Capillary: 52 mg/dL — ABNORMAL LOW (ref 70–99)
Glucose-Capillary: 76 mg/dL (ref 70–99)

## 2012-03-20 LAB — BASIC METABOLIC PANEL
BUN: 28 mg/dL — ABNORMAL HIGH (ref 6–23)
Creatinine, Ser: 0.67 mg/dL (ref 0.50–1.10)
GFR calc non Af Amer: 90 mL/min — ABNORMAL LOW (ref >90)
Glucose, Bld: 67 mg/dL — ABNORMAL LOW (ref 70–99)
Potassium: 3.9 mEq/L (ref 3.5–5.1)

## 2012-03-20 MED ORDER — GLIMEPIRIDE 4 MG PO TABS
4.0000 mg | ORAL_TABLET | Freq: Every day | ORAL | Status: DC
  Administered 2012-03-20 – 2012-03-22 (×3): 4 mg via ORAL
  Filled 2012-03-20 (×3): qty 1

## 2012-03-20 MED ORDER — GUAIFENESIN 100 MG/5ML PO SOLN
5.0000 mL | ORAL | Status: DC | PRN
  Administered 2012-03-21: 100 mg via ORAL
  Filled 2012-03-20: qty 5

## 2012-03-20 NOTE — Progress Notes (Signed)
PATIENT DETAILS Name: Angelica [name] Age: 66 y.o. Sex: female Date of Birth: 10-19-1946 Admit Date: 03/18/2012 JXB:[id number],FAOZHYQ ROBERT, MD, MD  Subjective: Cough worse today, shortness of breath better.  Objective: Vital signs in last 24 hours: Filed Vitals:   03/20/12 0316 03/20/12 0556 03/20/12 0833 03/20/12 1038  BP:  100/59  148/76  Pulse:  92    Temp:  97.7 F (36.5 C)    TempSrc:  Oral    Resp:  22    Height:      Weight:      SpO2: 96% 97% 98%     Weight change:   Body mass index is 36.28 kg/(m^2).  Intake/Output from previous day:  Intake/Output Summary (Last 24 hours) at 03/20/12 1115 Last data filed at 03/20/12 0900  Gross per 24 hour  Intake    834 ml  Output      0 ml  Net    834 ml    PHYSICAL EXAM: Gen Exam: Awake and alert with clear speech.   Neck: Supple, No JVD.   Chest: B/L Clear, some scattered rhonchi CVS: S1 S2 Regular, no murmurs.  Abdomen: soft, BS +, non tender, non distended.  Extremities: no edema, lower extremities warm to touch. Neurologic: Non Focal.   Skin: No Rash.   Wounds: N/A.    CONSULTS:  None  LAB RESULTS: CBC  Lab 03/20/12 0523 03/19/12 0650 03/18/12 2006 03/18/12 1524  WBC 14.3* 15.8* 18.2* 14.4*  HGB 9.4* 10.2* 10.4* 10.3*  HCT 30.4* 33.4* 33.6* 33.2*  PLT 456* 451* 419* 408*  MCV 82.2 81.3 81.2 81.0  MCH 25.4* 24.8* 25.1* 25.1*  MCHC 30.9 30.5 31.0 31.0  RDW 16.8* 16.8* 16.8* 16.8*  LYMPHSABS -- -- -- 0.6*  MONOABS -- -- -- 0.8  EOSABS -- -- -- 0.0  BASOSABS -- -- -- 0.0  BANDABS -- -- -- --    Chemistries   Lab 03/20/12 0523 03/19/12 0650 03/18/12 2006 03/18/12 1524  NA 135 133* -- 138  K 3.9 5.1 -- 4.1  CL 94* 94* -- 98  CO2 34* 31 -- 27  GLUCOSE 67* 173* -- 225*  BUN 28* 20 -- 17  CREATININE 0.67 0.59 0.56 0.54  CALCIUM 9.9 9.9 -- 9.6  MG -- 1.8 -- --    GFR Estimated Creatinine Clearance: 69.3 ml/min (by C-G formula based on Cr of 0.67).  Coagulation profile No results found for  this basename: INR:5,PROTIME:5 in the last 168 hours  Cardiac Enzymes  Lab 03/18/12 1524  CKMB 2.5  TROPONINI --  MYOGLOBIN --    No components found with this basename: POCBNP:3 No results found for this basename: DDIMER:2 in the last 72 hours No results found for this basename: HGBA1C:2 in the last 72 hours No results found for this basename: CHOL:2,HDL:2,LDLCALC:2,TRIG:2,CHOLHDL:2,LDLDIRECT:2 in the last 72 hours No results found for this basename: TSH,T4TOTAL,FREET3,T3FREE,THYROIDAB in the last 72 hours No results found for this basename: VITAMINB12:2,FOLATE:2,FERRITIN:2,TIBC:2,IRON:2,RETICCTPCT:2 in the last 72 hours No results found for this basename: LIPASE:2,AMYLASE:2 in the last 72 hours  Urine Studies No results found for this basename: UACOL:2,UAPR:2,USPG:2,UPH:2,UTP:2,UGL:2,UKET:2,UBIL:2,UHGB:2,UNIT:2,UROB:2,ULEU:2,UEPI:2,UWBC:2,URBC:2,UBAC:2,CAST:2,CRYS:2,UCOM:2,BILUA:2 in the last 72 hours  MICROBIOLOGY: Recent Results (from the past 240 hour(s))  CULTURE, BLOOD (ROUTINE X 2)     Status: Normal (Preliminary result)   Collection Time   03/18/12  6:10 PM      Component Value Range Status Comment   Specimen Description BLOOD RIGHT ARM   Final    Special Requests BOTTLES DRAWN AEROBIC  AND ANAEROBIC 10CC   Final    Culture  Setup Time [id number]   Final    Culture     Final    Value:        BLOOD CULTURE RECEIVED NO GROWTH TO DATE CULTURE WILL BE HELD FOR 5 DAYS BEFORE ISSUING A FINAL NEGATIVE REPORT   Report Status PENDING   Incomplete   CULTURE, BLOOD (ROUTINE X 2)     Status: Normal (Preliminary result)   Collection Time   03/18/12  6:12 PM      Component Value Range Status Comment   Specimen Description BLOOD LEFT ARM   Final    Special Requests BOTTLES DRAWN AEROBIC AND ANAEROBIC 10CC   Final    Culture  Setup Time [id number]   Final    Culture     Final    Value:        BLOOD CULTURE RECEIVED NO GROWTH TO DATE CULTURE WILL BE HELD FOR 5 DAYS BEFORE ISSUING A  FINAL NEGATIVE REPORT   Report Status PENDING   Incomplete   CULTURE, SPUTUM-ASSESSMENT     Status: Normal   Collection Time   03/19/12  3:05 PM      Component Value Range Status Comment   Specimen Description SPUTUM   Final    Special Requests NONE   Final    Sputum evaluation     Final    Value: THIS SPECIMEN IS ACCEPTABLE. RESPIRATORY CULTURE REPORT TO FOLLOW.   Report Status 03/19/2012 FINAL   Final   CULTURE, RESPIRATORY     Status: Normal (Preliminary result)   Collection Time   03/19/12  3:05 PM      Component Value Range Status Comment   Specimen Description SPUTUM   Final    Special Requests NONE   Final    Gram Stain     Final    Value: FEW WBC PRESENT,BOTH PMN AND MONONUCLEAR     NO SQUAMOUS EPITHELIAL CELLS SEEN     NO ORGANISMS SEEN   Culture Culture reincubated for better growth   Final    Report Status PENDING   Incomplete     RADIOLOGY STUDIES/RESULTS: Dg Chest 2 View  03/02/2012  *RADIOLOGY REPORT*  Clinical Data: Cough and congestion.  History of COPD appear  CHEST - 2 VIEW  Comparison: Read chest radiograph 11/27/2011  Findings: The heart, mediastinal, and hilar contours are stable. Heart size is normal.  There is slight peribronchial thickening bilaterally.  No focal airspace disease, effusion, or pneumothorax. Mild superior endplate compression deformity of T12 vertebral body appears stable.  No acute bony abnormality.  IMPRESSION: 1. Bilateral peribronchial thickening.  This can be seen in the setting of acute or chronic bronchitis, smoking, or asthma. 2. Stable mild compression deformity T12.  Original Report Authenticated By: Britta Mccreedy, M.D.   Dg Chest Port 1 View  03/18/2012  *RADIOLOGY REPORT*  Clinical Data: Chest and rib pain.  Shortness of breath.  Cough and fever.  PORTABLE CHEST - 1 VIEW  Comparison: Chest x-ray 03/02/2012.  Findings: Lung volumes are normal.  There is an ill-defined opacity partially obscuring the left heart border, that may represent a  small region of consolidation in the lingula.  Mild diffuse interstitial prominence is noted, with thickening of the central bronchi.  No definite pleural effusions.  Pulmonary vasculature and the cardiac silhouette are within normal limits. No displaced rib fractures or pneumothorax identified. Atherosclerotic calcifications within the arch of the aorta.  IMPRESSION:  1.  Findings, as above, concerning for bronchitis.  There may be an early area of airspace consolidation in the lingula that could indicate an early bronchopneumonia.  Clinical correlation is recommended. 2.  Atherosclerosis.  Original Report Authenticated By: Florencia Reasons, M.D.    MEDICATIONS: Scheduled Meds:    . ipratropium  0.5 mg Nebulization Q6H   And  . albuterol  2.5 mg Nebulization Q6H  . diltiazem  120 mg Oral QHS  . docusate sodium  100 mg Oral QHS  . DULoxetine  60 mg Oral Daily  . estradiol  1 mg Oral QHS  . fenofibrate  160 mg Oral QHS  . heparin  5,000 Units Subcutaneous Q8H  . insulin aspart  0-15 Units Subcutaneous TID WC  . insulin aspart  0-5 Units Subcutaneous QHS  . insulin aspart  3 Units Subcutaneous TID WC  . insulin glargine  15 Units Subcutaneous QHS  . irbesartan  75 mg Oral Daily  . levofloxacin (LEVAQUIN) IV  750 mg Intravenous Q24H  . linagliptin  5 mg Oral Daily  . lisinopril  10 mg Oral Daily  . nicotine  21 mg Transdermal Daily  . pantoprazole  40 mg Oral Q1200  . predniSONE  40 mg Oral Q breakfast  . sodium chloride  3 mL Intravenous Q12H  . sodium polystyrene  30 g Oral Once  . triamterene-hydrochlorothiazide  1 each Oral BH-q7a  . vancomycin  1,000 mg Intravenous Q12H  . DISCONTD: aztreonam  2 g Intravenous Q8H  . DISCONTD: insulin glargine  20 Units Subcutaneous QHS  . DISCONTD: methylPREDNISolone (SOLU-MEDROL) injection  40 mg Intravenous Q12H   Continuous Infusions:  PRN Meds:.sodium chloride, acetaminophen, albuterol, chlorpheniramine-HYDROcodone, cyclobenzaprine,  diphenhydrAMINE, guaiFENesin, oxyCODONE-acetaminophen, sodium chloride  Antibiotics: Anti-infectives     Start     Dose/Rate Route Frequency Ordered Stop   03/18/12 2200   aztreonam (AZACTAM) 2 g in dextrose 5 % 50 mL IVPB  Status:  Discontinued        2 g 100 mL/hr over 30 Minutes Intravenous 3 times per day 03/18/12 1939 03/20/12 0942   03/18/12 2100   vancomycin (VANCOCIN) IVPB 1000 mg/200 mL premix        1,000 mg 200 mL/hr over 60 Minutes Intravenous Every 12 hours 03/18/12 1951     03/18/12 2000   levofloxacin (LEVAQUIN) IVPB 750 mg        750 mg 100 mL/hr over 90 Minutes Intravenous Every 24 hours 03/18/12 1939 03/21/12 1959   03/18/12 1715   moxifloxacin (AVELOX) IVPB 400 mg  Status:  Discontinued        400 mg 250 mL/hr over 60 Minutes Intravenous  Once 03/18/12 1703 03/18/12 1920   03/18/12 1700   moxifloxacin (AVELOX) IVPB 400 mg  Status:  Discontinued        400 mg 250 mL/hr over 60 Minutes Intravenous  Once 03/18/12 1649 03/18/12 1656          Assessment/Plan: Patient Active Hospital Problem List: Respiratory failure with hypoxia -likely secondary to COPD Exacerbation -continue with nebulized bronchodilators -Continue with prednisone -continue with Empiric Antibiotics-but will stop Azactam and continue with vancomycin and Levaquin -assess home O2 requirement -titrate off oxygen  -check BNP with am labs-only 230. -Continue with incentive spirometry -due to persistent cough will stop lisinopril  ?HCAP vs Bronchopneumonia -continue with empiric antibiotics-will stop Azactam and continue with vancomycin and Levaquin -We'll repeat chest x-ray today - DM -CBG's controlled -We'll resume Amaryl and add Januvia,  but would only resume metformin on discharge -Hypoglycemic episode this a.m., will stop Lantus however continue with SSI  HTN -BP Controlled -Continue with Avapro, Diltiazem and Maxzide, due to persistent cough will stop lisinopril  Leukocytosis -  slowly downtrending -secondary to Steroids/PNA -continue to monitor -afebrile and non-toxic looking  Anemia H/H close to usual baseline Monitor periodically  Morbid Obesity -counselled extensively -re importance of loosing weight  Disposition: Remain inpatient-possible home in the next 1-2 days. We'll need to assess for home oxygen requirement  DVT Prophylaxis: Prophylactic heparin  Code Status: Full Code  Maretta Bees, MD. 03/20/2012, 11:15 AM

## 2012-03-20 NOTE — Progress Notes (Signed)
Very small bm.

## 2012-03-21 LAB — CULTURE, RESPIRATORY W GRAM STAIN

## 2012-03-21 LAB — CBC
Hemoglobin: 9 g/dL — ABNORMAL LOW (ref 12.0–15.0)
MCH: 25.3 pg — ABNORMAL LOW (ref 26.0–34.0)
MCV: 82.6 fL (ref 78.0–100.0)
RBC: 3.56 MIL/uL — ABNORMAL LOW (ref 3.87–5.11)

## 2012-03-21 LAB — GLUCOSE, CAPILLARY
Glucose-Capillary: 102 mg/dL — ABNORMAL HIGH (ref 70–99)
Glucose-Capillary: 157 mg/dL — ABNORMAL HIGH (ref 70–99)
Glucose-Capillary: 399 mg/dL — ABNORMAL HIGH (ref 70–99)
Glucose-Capillary: 294 mg/dL — ABNORMAL HIGH (ref 70–99)

## 2012-03-21 LAB — BASIC METABOLIC PANEL
CO2: 38 mEq/L — ABNORMAL HIGH (ref 19–32)
Calcium: 9.9 mg/dL (ref 8.4–10.5)
Creatinine, Ser: 0.57 mg/dL (ref 0.50–1.10)
Glucose, Bld: 68 mg/dL — ABNORMAL LOW (ref 70–99)

## 2012-03-21 MED ORDER — POTASSIUM CHLORIDE CRYS ER 20 MEQ PO TBCR
40.0000 meq | EXTENDED_RELEASE_TABLET | Freq: Once | ORAL | Status: AC
  Administered 2012-03-21: 40 meq via ORAL
  Filled 2012-03-21: qty 2

## 2012-03-21 MED ORDER — DEXTROSE 50 % IV SOLN
INTRAVENOUS | Status: AC
  Administered 2012-03-21: 25 mL
  Filled 2012-03-21: qty 50

## 2012-03-21 MED ORDER — MOXIFLOXACIN HCL 400 MG PO TABS
400.0000 mg | ORAL_TABLET | Freq: Every day | ORAL | Status: DC
  Administered 2012-03-21: 400 mg via ORAL
  Filled 2012-03-21 (×2): qty 1

## 2012-03-21 MED ORDER — FUROSEMIDE 10 MG/ML IJ SOLN
40.0000 mg | Freq: Once | INTRAMUSCULAR | Status: AC
  Administered 2012-03-21: 40 mg via INTRAVENOUS
  Filled 2012-03-21: qty 4

## 2012-03-21 NOTE — Progress Notes (Signed)
Inpatient Diabetes Program Recommendations  AACE/ADA: New Consensus Statement on Inpatient Glycemic Control (2009)  Target Ranges:  Prepandial:   less than 140 mg/dL      Peak postprandial:   less than 180 mg/dL (1-2 hours)      Critically ill patients:  140 - 180 mg/dL   Reason for Visit: Hypoglycemia following large doses of correction needed for hyperglycemia  Inpatient Diabetes Program Recommendations Insulin - Meal Coverage: Would prefer use 3 units meal coverage tidwc, as glucose levels increase followig breakfast and lunch and then require higher doses correction which tends to set pt up for hypoglycemia (as this am). Oral Agents: Amaryl does not appear to be effective presently.  Not recommended while in the hospital.  Would get better control with meal coverage.  Note: Meal coverage hopefully will prevent hyperglycemia requiring large doses correction followed by hyoglycemia on the following am. Thank you, Lenor Coffin, RN, CNS, Diabetes Coordinator [phone])

## 2012-03-21 NOTE — Progress Notes (Signed)
SATURATION QUALIFICATIONS:  Patient Saturations on Room Air at Rest = 85%  Christell Constant, Dirk Dress

## 2012-03-21 NOTE — Progress Notes (Signed)
PATIENT DETAILS Name: Angelica [name] Age: 66 y.o. Sex: female Date of Birth: Jul 01, 1946 Admit Date: 03/18/2012 [id number],HQIONGE ROBERT, MD, MD  Subjective: Feels better, denies shortness of breath. Her cough and sputum production is much better than yesterday  Objective: Vital signs in last 24 hours: Filed Vitals:   03/20/12 2117 03/21/12 0316 03/21/12 0524 03/21/12 0823  BP: 125/74  108/67   Pulse: 106  78   Temp: 97.7 F (36.5 C)  97.6 F (36.4 C)   TempSrc: Oral  Oral   Resp: 20  20   Height:      Weight:      SpO2: 98% 95% 99% 98%    Weight change:   Body mass index is 36.28 kg/(m^2).  Intake/Output from previous day:  Intake/Output Summary (Last 24 hours) at 03/21/12 1132 Last data filed at 03/21/12 0900  Gross per 24 hour  Intake    600 ml  Output      0 ml  Net    600 ml    PHYSICAL EXAM: Gen Exam: Awake and alert with clear speech.   Neck: Supple, No JVD.   Chest: B/L Clear, some scattered rhonchi CVS: S1 S2 Regular, no murmurs.  Abdomen: soft, BS +, non tender, non distended.  Extremities: no edema, lower extremities warm to touch. Neurologic: Non Focal.   Skin: No Rash.   Wounds: N/A.    CONSULTS:  None  LAB RESULTS: CBC  Lab 03/21/12 0545 03/20/12 0523 03/19/12 0650 03/18/12 2006 03/18/12 1524  WBC 7.5 14.3* 15.8* 18.2* 14.4*  HGB 9.0* 9.4* 10.2* 10.4* 10.3*  HCT 29.4* 30.4* 33.4* 33.6* 33.2*  PLT 425* 456* 451* 419* 408*  MCV 82.6 82.2 81.3 81.2 81.0  MCH 25.3* 25.4* 24.8* 25.1* 25.1*  MCHC 30.6 30.9 30.5 31.0 31.0  RDW 16.6* 16.8* 16.8* 16.8* 16.8*  LYMPHSABS -- -- -- -- 0.6*  MONOABS -- -- -- -- 0.8  EOSABS -- -- -- -- 0.0  BASOSABS -- -- -- -- 0.0  BANDABS -- -- -- -- --    Chemistries   Lab 03/21/12 0545 03/20/12 0523 03/19/12 0650 03/18/12 2006 03/18/12 1524  NA 138 135 133* -- 138  K 3.7 3.9 5.1 -- 4.1  CL 96 94* 94* -- 98  CO2 38* 34* 31 -- 27  GLUCOSE 68* 67* 173* -- 225*  BUN 23 28* 20 -- 17  CREATININE 0.57 0.67  0.59 0.56 0.54  CALCIUM 9.9 9.9 9.9 -- 9.6  MG -- -- 1.8 -- --    GFR Estimated Creatinine Clearance: 69.3 ml/min (by C-G formula based on Cr of 0.57).  Coagulation profile No results found for this basename: INR:5,PROTIME:5 in the last 168 hours  Cardiac Enzymes  Lab 03/18/12 1524  CKMB 2.5  TROPONINI --  MYOGLOBIN --    MICROBIOLOGY: Recent Results (from the past 240 hour(s))  CULTURE, BLOOD (ROUTINE X 2)     Status: Normal (Preliminary result)   Collection Time   03/18/12  6:10 PM      Component Value Range Status Comment   Specimen Description BLOOD RIGHT ARM   Final    Special Requests BOTTLES DRAWN AEROBIC AND ANAEROBIC 10CC   Final    Culture  Setup Time [id number]   Final    Culture     Final    Value:        BLOOD CULTURE RECEIVED NO GROWTH TO DATE CULTURE WILL BE HELD FOR 5 DAYS BEFORE ISSUING A FINAL NEGATIVE REPORT  Report Status PENDING   Incomplete   CULTURE, BLOOD (ROUTINE X 2)     Status: Normal (Preliminary result)   Collection Time   03/18/12  6:12 PM      Component Value Range Status Comment   Specimen Description BLOOD LEFT ARM   Final    Special Requests BOTTLES DRAWN AEROBIC AND ANAEROBIC 10CC   Final    Culture  Setup Time [id number]   Final    Culture     Final    Value:        BLOOD CULTURE RECEIVED NO GROWTH TO DATE CULTURE WILL BE HELD FOR 5 DAYS BEFORE ISSUING A FINAL NEGATIVE REPORT   Report Status PENDING   Incomplete   CULTURE, SPUTUM-ASSESSMENT     Status: Normal   Collection Time   03/19/12  3:05 PM      Component Value Range Status Comment   Specimen Description SPUTUM   Final    Special Requests NONE   Final    Sputum evaluation     Final    Value: THIS SPECIMEN IS ACCEPTABLE. RESPIRATORY CULTURE REPORT TO FOLLOW.   Report Status 03/19/2012 FINAL   Final   CULTURE, RESPIRATORY     Status: Normal   Collection Time   03/19/12  3:05 PM      Component Value Range Status Comment   Specimen Description SPUTUM   Final    Special  Requests NONE   Final    Gram Stain     Final    Value: FEW WBC PRESENT,BOTH PMN AND MONONUCLEAR     NO SQUAMOUS EPITHELIAL CELLS SEEN     NO ORGANISMS SEEN   Culture NORMAL OROPHARYNGEAL FLORA   Final    Report Status 03/21/2012 FINAL   Final     RADIOLOGY STUDIES/RESULTS: Dg Chest 2 View  03/02/2012  *RADIOLOGY REPORT*  Clinical Data: Cough and congestion.  History of COPD appear  CHEST - 2 VIEW  Comparison: Read chest radiograph 11/27/2011  Findings: The heart, mediastinal, and hilar contours are stable. Heart size is normal.  There is slight peribronchial thickening bilaterally.  No focal airspace disease, effusion, or pneumothorax. Mild superior endplate compression deformity of T12 vertebral body appears stable.  No acute bony abnormality.  IMPRESSION: 1. Bilateral peribronchial thickening.  This can be seen in the setting of acute or chronic bronchitis, smoking, or asthma. 2. Stable mild compression deformity T12.  Original Report Authenticated By: Britta Mccreedy, M.D.   Dg Chest Port 1 View  03/18/2012  *RADIOLOGY REPORT*  Clinical Data: Chest and rib pain.  Shortness of breath.  Cough and fever.  PORTABLE CHEST - 1 VIEW  Comparison: Chest x-ray 03/02/2012.  Findings: Lung volumes are normal.  There is an ill-defined opacity partially obscuring the left heart border, that may represent a small region of consolidation in the lingula.  Mild diffuse interstitial prominence is noted, with thickening of the central bronchi.  No definite pleural effusions.  Pulmonary vasculature and the cardiac silhouette are within normal limits. No displaced rib fractures or pneumothorax identified. Atherosclerotic calcifications within the arch of the aorta.  IMPRESSION: 1.  Findings, as above, concerning for bronchitis.  There may be an early area of airspace consolidation in the lingula that could indicate an early bronchopneumonia.  Clinical correlation is recommended. 2.  Atherosclerosis.  Original Report  Authenticated By: Florencia Reasons, M.D.    MEDICATIONS: Scheduled Meds:    . ipratropium  0.5 mg Nebulization Q6H  And  . albuterol  2.5 mg Nebulization Q6H  . dextrose      . diltiazem  120 mg Oral QHS  . docusate sodium  100 mg Oral QHS  . DULoxetine  60 mg Oral Daily  . estradiol  1 mg Oral QHS  . fenofibrate  160 mg Oral QHS  . glimepiride  4 mg Oral Q lunch  . heparin  5,000 Units Subcutaneous Q8H  . insulin aspart  0-15 Units Subcutaneous TID WC  . insulin aspart  0-5 Units Subcutaneous QHS  . irbesartan  75 mg Oral Daily  . levofloxacin (LEVAQUIN) IV  750 mg Intravenous Q24H  . linagliptin  5 mg Oral Daily  . nicotine  21 mg Transdermal Daily  . pantoprazole  40 mg Oral Q1200  . predniSONE  40 mg Oral Q breakfast  . sodium chloride  3 mL Intravenous Q12H  . triamterene-hydrochlorothiazide  1 each Oral BH-q7a  . vancomycin  1,000 mg Intravenous Q12H   Continuous Infusions:  PRN Meds:.sodium chloride, acetaminophen, albuterol, chlorpheniramine-HYDROcodone, cyclobenzaprine, diphenhydrAMINE, guaiFENesin, oxyCODONE-acetaminophen, sodium chloride  Antibiotics: Anti-infectives     Start     Dose/Rate Route Frequency Ordered Stop   03/18/12 2200   aztreonam (AZACTAM) 2 g in dextrose 5 % 50 mL IVPB  Status:  Discontinued        2 g 100 mL/hr over 30 Minutes Intravenous 3 times per day 03/18/12 1939 03/20/12 0942   03/18/12 2100   vancomycin (VANCOCIN) IVPB 1000 mg/200 mL premix        1,000 mg 200 mL/hr over 60 Minutes Intravenous Every 12 hours 03/18/12 1951     03/18/12 2000   levofloxacin (LEVAQUIN) IVPB 750 mg        750 mg 100 mL/hr over 90 Minutes Intravenous Every 24 hours 03/18/12 1939 03/20/12 2129   03/18/12 1715   moxifloxacin (AVELOX) IVPB 400 mg  Status:  Discontinued        400 mg 250 mL/hr over 60 Minutes Intravenous  Once 03/18/12 1703 03/18/12 1920   03/18/12 1700   moxifloxacin (AVELOX) IVPB 400 mg  Status:  Discontinued        400 mg 250  mL/hr over 60 Minutes Intravenous  Once 03/18/12 1649 03/18/12 1656          Assessment/Plan: Patient Active Hospital Problem List:  Respiratory failure with hypoxia -likely secondary to COPD Exacerbation -continue with nebulized bronchodilators -Continue with prednisone -continue with Empiric Antibiotics-but will discontinue vancomycin the Levaquin and start Avelox. -assess home O2 requirement  ?HCAP vs Bronchopneumonia -Continue with empiric antibiotics -Discontinue vancomycin Levaquin and continue Avelox. -Chest x-ray shows atypical infiltrates might be bronchopneumonia versus edema. - DM -CBG's controlled -We'll resume Amaryl and add Januvia, but would only resume metformin on discharge -Hypoglycemic episode in the morning. Likely from overcorrection with NovoLog insulin.  HTN -BP Controlled -Continue with Avapro, Diltiazem and Maxzide, due to persistent cough will stop lisinopril  Leukocytosis -secondary to Steroids/PNA -Afebrile, nontoxic patient. WBC from 18.22 normal 7.5 this morning.  Anemia -H/H close to usual baseline -Monitor periodically  DVT Prophylaxis: Prophylactic heparin  Code Status: Full Code  Clint Lipps Pager: [phone] 03/21/2012, 11:32 AM

## 2012-03-21 NOTE — Progress Notes (Signed)
   CARE MANAGEMENT NOTE 03/21/2012  Patient:  Angelica [name], Angelica [name]   Account Number:  [id number]  Date Initiated:  03/19/2012  Documentation initiated by:  Donn Pierini  Subjective/Objective Assessment:   Pt admitted with respiratory failure- PNA/COPD     Action/Plan:   PTA pt lived at home with spouse, was independent with ADLs   Anticipated DC Date:  03/21/2012   Anticipated DC Plan:  HOME/SELF CARE      DC Planning Services  CM consult      PAC Choice  DURABLE MEDICAL EQUIPMENT   Choice offered to / List presented to:  C-1 Patient   DME arranged  [facility]      DME agency  [facility]        Status of service:  In process, will continue to follow Medicare Important Message given?   (If response is "NO", the following Medicare IM given date fields will be blank) Date Medicare IM given:   Date Additional Medicare IM given:    Discharge Disposition:    Per UR Regulation:    If discussed at Long Length of Stay Meetings, dates discussed:    Comments:  PCP- [place]  03/21/12- 1530- Donn Pierini RN, BSN [phone] Pt O2 sats 85% on RA at rest- will need home O2, MD to place order. List of DME agencies given to pt- pt would like to use [facility] for her home O2- faxed referral to [facility]- along with facesheet, and progress note. CM to continue to follow.  03/20/12- 1400- Donn Pierini RN, BSN [phone] Spoke with pt and her daughter at bedside- per conversation pt states that she lives at home with spouse (who is in hospital also on vent in ICU). Plan is to go home at discharge. Pt may need Home O2- and is agreeable to that if needed. RN to assess need for Home O2 and document. CM to f/u in am.

## 2012-03-21 NOTE — Progress Notes (Addendum)
CBG:  39  Treatment: D50 IV 25 mL  Symptoms: Sweaty  Follow-up CBG: Time:0755 CBG Result:102  Possible Reasons for Event: Inadequate meal intake  Comments/MD notified:Dr. Elmahi and notified to the floor. Pt educated to notify if any further sweating or itching.    Dondra Spry

## 2012-03-22 LAB — BASIC METABOLIC PANEL
Calcium: 9.9 mg/dL (ref 8.4–10.5)
GFR calc non Af Amer: >90 mL/min (ref >90)
Sodium: 136 mEq/L (ref 135–145)

## 2012-03-22 MED ORDER — ALBUTEROL SULFATE (5 MG/ML) 0.5% IN NEBU
2.5000 mg | INHALATION_SOLUTION | Freq: Three times a day (TID) | RESPIRATORY_TRACT | Status: DC
  Administered 2012-03-22 (×2): 2.5 mg via RESPIRATORY_TRACT
  Filled 2012-03-22 (×2): qty 0.5

## 2012-03-22 MED ORDER — FUROSEMIDE 10 MG/ML IJ SOLN
40.0000 mg | Freq: Once | INTRAMUSCULAR | Status: DC
  Filled 2012-03-22: qty 4

## 2012-03-22 MED ORDER — IPRATROPIUM BROMIDE 0.02 % IN SOLN: 0.5000 mg | Freq: Three times a day (TID) | RESPIRATORY_TRACT | Status: DC

## 2012-03-22 MED ORDER — DEXTROMETHORPHAN-GUAIFENESIN 10-100 MG/5ML PO LIQD: 5.0000 mL | ORAL | Status: DC | PRN

## 2012-03-22 MED ORDER — ALBUTEROL SULFATE HFA 108 (90 BASE) MCG/ACT IN AERS: 2.0000 | INHALATION_SPRAY | RESPIRATORY_TRACT | Status: DC | PRN

## 2012-03-22 MED ORDER — IPRATROPIUM BROMIDE 0.02 % IN SOLN
0.5000 mg | Freq: Three times a day (TID) | RESPIRATORY_TRACT | Status: DC
  Administered 2012-03-22 (×2): 0.5 mg via RESPIRATORY_TRACT
  Filled 2012-03-22 (×2): qty 2.5

## 2012-03-22 MED ORDER — MOXIFLOXACIN HCL 400 MG PO TABS: 400.0000 mg | ORAL_TABLET | Freq: Every day | ORAL | Status: AC

## 2012-03-22 MED ORDER — PREDNISONE 10 MG PO TABS: ORAL_TABLET | ORAL | Status: DC

## 2012-03-22 MED ORDER — HYDROCOD POLST-CHLORPHEN POLST 10-8 MG/5ML PO LQCR: 5.0000 mL | Freq: Two times a day (BID) | ORAL | Status: DC | PRN

## 2012-03-22 MED ORDER — ALBUTEROL SULFATE (5 MG/ML) 0.5% IN NEBU: 2.5000 mg | INHALATION_SOLUTION | Freq: Four times a day (QID) | RESPIRATORY_TRACT | Status: DC

## 2012-03-22 NOTE — Progress Notes (Signed)
03/22/12  1630  D/C instructions and prescriptions given and explained to pt.  Pt. Needed prescription for Tussionex and Dr. Arthor Captain informed and given to pt. Pt. waiting on ride from family. No problems noted.  Accompanied downstairs for D/C by Jasma,NT via w/c. No change in skin.   Leandrew Koyanagi Angelo Caroll,RN

## 2012-03-22 NOTE — Progress Notes (Signed)
   CARE MANAGEMENT NOTE 03/22/2012  Patient:  Angelica [name], Angelica [name]   Account Number:  [id number]  Date Initiated:  03/19/2012  Documentation initiated by:  Donn Pierini  Subjective/Objective Assessment:   Pt admitted with respiratory failure- PNA/COPD     Action/Plan:   PTA pt lived at home with spouse, was independent with ADLs   Anticipated DC Date:  03/22/2012   Anticipated DC Plan:  HOME/SELF CARE      DC Planning Services  CM consult      PAC Choice  DURABLE MEDICAL EQUIPMENT   Choice offered to / List presented to:  C-1 Patient   DME arranged  OXYGEN      DME agency  [facility]        Status of service:  Completed, signed off Medicare Important Message given?   (If response is "NO", the following Medicare IM given date fields will be blank) Date Medicare IM given:   Date Additional Medicare IM given:    Discharge Disposition:  HOME/SELF CARE  Per UR Regulation:    If discussed at Long Length of Stay Meetings, dates discussed:    Comments:  PCP- [place]  03/22/12 12:20 Letha Cape RN, BSN [phone] patient for possible  dc today, [facility] is bringing oxygen tank to patient's room today at lunch time.  03/21/12- 1530- Donn Pierini RN, BSN [phone] Pt O2 sats 85% on RA at rest- will need home O2, MD to place order. List of DME agencies given to pt- pt would like to use [facility] for her home O2- faxed referral to [facility]- along with facesheet, and progress note. CM to continue to follow.  03/20/12- 1400- Donn Pierini RN, BSN [phone] Spoke with pt and her daughter at bedside- per conversation pt states that she lives at home with spouse (who is in hospital also on vent in ICU). Plan is to go home at discharge. Pt may need Home O2- and is agreeable to that if needed. RN to assess need for Home O2 and document. CM to f/u in am.

## 2012-03-22 NOTE — Discharge Summary (Addendum)
HOSPITAL DISCHARGE SUMMARY  Angelica [name]  MRN: [id number]  DOB:[birth year]  Date of Admission: 03/18/2012 Date of Discharge: 03/22/2012         LOS: 4 days   Attending Physician:  Clydia Llano A  Patient's PCP:  Josue Hector, MD, MD  Consults: None  Discharge Diagnosis: Health care associated pneumonia  Present on Admission:  .Respiratory failure with hypoxia .Healthcare-associated pneumonia .COPD (chronic obstructive pulmonary disease) .HTN (hypertension) .DM type 2 (diabetes mellitus, type 2) .Anemia   Medication List  As of 03/22/2012  5:08 PM   STOP taking these medications         QC SINUS PAIN RELIEF 5-325 MG Tabs         TAKE these medications         ADVAIR DISKUS 250-50 MCG/DOSE Aepb   Generic drug: Fluticasone-Salmeterol   Inhale 1 puff into the lungs every 12 (twelve) hours.      benzonatate 100 MG capsule   Commonly known as: TESSALON   Take 100 mg by mouth 3 (three) times daily as needed. For cough      chlorpheniramine-HYDROcodone 10-8 MG/5ML Lqcr   Commonly known as: TUSSIONEX   Take 5 mLs by mouth every 12 (twelve) hours as needed. For cough      cyclobenzaprine 10 MG tablet   Commonly known as: FLEXERIL   Take 10 mg by mouth 3 (three) times daily as needed. For muscle spasms      diltiazem 120 MG 24 hr capsule   Commonly known as: TIAZAC   Take 120 mg by mouth at bedtime.      diphenhydramine-acetaminophen 25-500 MG Tabs   Commonly known as: TYLENOL PM   Take 1 tablet by mouth at bedtime as needed. For sleep      docusate sodium 100 MG capsule   Commonly known as: COLACE   Take 100 mg by mouth at bedtime.      DULoxetine 60 MG capsule   Commonly known as: CYMBALTA   Take 60 mg by mouth daily.      ergocalciferol 50000 UNITS capsule   Commonly known as: VITAMIN D2   Take 50,000 Units by mouth once a week. On Saturdays      estradiol 1 MG tablet   Commonly known as: ESTRACE   Take 1 mg by mouth at bedtime.     fenofibrate 160 MG tablet   Take 160 mg by mouth at bedtime.      glimepiride 4 MG tablet   Commonly known as: AMARYL   Take 4 mg by mouth daily. With main meal of the day      guaiFENesin 600 MG 12 hr tablet   Commonly known as: MUCINEX   Take 1,200 mg by mouth 2 (two) times daily as needed. For chest congestion      ibuprofen 200 MG tablet   Commonly known as: ADVIL,MOTRIN   Take 400 mg by mouth every 6 (six) hours as needed. For pain      JANUVIA PO   Take 1 tablet by mouth every morning.      lisinopril 10 MG tablet   Commonly known as: PRINIVIL,ZESTRIL   Take 10 mg by mouth daily.      metFORMIN 500 MG (MOD) 24 hr tablet   Commonly known as: GLUMETZA   Take 500 mg by mouth 2 (two) times daily with a meal.      moxifloxacin 400 MG tablet   Commonly known as: AVELOX   Take  1 tablet (400 mg total) by mouth daily at 6 PM.      Nicotine 21-14-7 MG/24HR Kit   Place 1 patch onto the skin daily.      omeprazole 20 MG capsule   Commonly known as: PRILOSEC   Take 20 mg by mouth 2 (two) times daily.      predniSONE 10 MG tablet   Commonly known as: DELTASONE   Take 40mg  po daily for 2 days then 30mg  po daily for 2 days then 20mg  po daily for 2 days then 10mg  po daily for 2 days then stop      PROAIR HFA 108 (90 BASE) MCG/ACT inhaler   Generic drug: albuterol   Inhale 2 puffs into the lungs every 6 (six) hours as needed. For shortness of breath      albuterol (5 MG/ML) 0.5% nebulizer solution   Commonly known as: PROVENTIL   Take 0.5 mLs (2.5 mg total) by nebulization every 6 (six) hours as needed for wheezing.      triamterene-hydrochlorothiazide 37.5-25 MG per tablet   Commonly known as: MAXZIDE-25   Take 1 tablet by mouth every morning.      valsartan 160 MG tablet   Commonly known as: DIOVAN   Take 160 mg by mouth every morning.             Brief Admission History: This is a 66 year old female with past medical history of COPD, former smoker quit about 2  weeks ago, hypertension, diabetes that comes in for cough and shortness of breath. She relates to start 3 days prior to admission. She started getting cough productive. She fell in the bathtub about a week ago prior to admission. And she hit her left side of the chest. She relates is tender to palpation. She relates that 1 day prior to admission she was soaking wet in her bed in relation to her temperature but had no fevers at bedtime. She relates prior to admission she walked more than 300 feet she cannot even walk to the bathroom without getting short of breath. She also relates she started wheezing for 24 hours prior to admission. Which has progressively she feels like her chest is tight. She relates no nausea vomiting diarrhea.  Hospital Course: Present on Admission:  .Respiratory failure with hypoxia .Healthcare-associated pneumonia .COPD (chronic obstructive pulmonary disease) .HTN (hypertension) .DM type 2 (diabetes mellitus, type 2) .Anemia   1. Pneumonia: This is treated as a health care associated pneumonia, in the x-ray appears it's as bronchopneumonia. Patient was recently in [facility] for pneumonia as well. After admission patient started on broad-spectrum antibiotics including Levaquin and vancomycin. Patient shortness of breath was improving as well as cough and other symptoms. So antibiotic was switched to only oral Avelox. Patient to complete total of 7 days of antibiotics. Patient also discharged home on steroids taper.  2. Respiratory failure with hypoxia: This is acute respiratory failure, because of the oxygen needs. Patient does have COPD, at the time of discharge patient was assessed for home O2 needs and she was desaturating on room air to about 85. Patient will be on 2 L of continuous home oxygen. Patient to followup with primary care physician for adjustment or even discontinuation of the coccydynia of not needed.  3. Diabetes mellitus type 2: Patient on metformin,  Januvia and Amaryl. Blood sugar was fluctuating in the hospital stay because of the systemic steroids patient was given for her wheezing. Patient also will go home on steroids  taper, holding her blood sugar will be controlled after the discontinuation of the steroids.  4. HTN: Controlled. Patient is on lisinopril, valsartan and Maxide. Blood pressure was controlled during this hospital stay.  5. COPD: Patient is tobacco abuser for some time. She quit recently, about 2 weeks ago on her previous admission to the hospital. Patient to continue her bronchodilators including Advair and albuterol. I did recommend Spiriva, but patient wanted to discuss with her primary care physician.  Day of Discharge BP 116/63  Pulse 94  Temp(Src) 98.1 F (36.7 C) (Oral)  Resp 20  Ht 5\' 1"  (1.549 m)  Wt 87.091 kg (192 lb)  BMI 36.28 kg/m2  SpO2 98% Physical Exam: GEN: No acute distress, cooperative with exam PSYCH:alert and oriented x4; does not appear anxious does not appear depressed; affect is normal  HEENT: Mucous membranes pink and anicteric;  Mouth: without oral thrush or lesions Eyes: PERRLA; EOM intact;  Neck: no cervical lymphadenopathy nor thyromegaly or bruit; no JVD;  CHEST WALL: No tenderness, symmetrical to breathing bilaterally CHEST: Normal respiration, clear to auscultation bilaterally  HEART: Regular rate and rhythm; no murmurs, rubs or gallops, S1 and S2 heard  BACK: No kyphosis or scoliosis; no CVA tenderness  ABDOMEN:  soft non-tender; no masses, no organomegaly, normal abdominal bowel sounds; no pannus; no intertriginous candida.  EXTREMITIES: No bone or joint deformity; no edema; no ulcerations.  PULSES: 2+ and symmetric, neurovascularity is intact SKIN: Normal hydration no rash or ulceration, no flushing or suspicious lesions  CNS: Cranial nerves 2-12 grossly intact no focal neurologic deficit, coordination is intact gait not tested    Results for orders placed during the hospital  encounter of 03/18/12 (from the past 24 hour(s))  GLUCOSE, CAPILLARY     Status: Abnormal   Collection Time   03/21/12  5:41 PM      Component Value Range   Glucose-Capillary 399 (*) 70 - 99 (mg/dL)  GLUCOSE, CAPILLARY     Status: Abnormal   Collection Time   03/21/12  9:28 PM      Component Value Range   Glucose-Capillary 294 (*) 70 - 99 (mg/dL)  BASIC METABOLIC PANEL     Status: Abnormal   Collection Time   03/22/12  5:19 AM      Component Value Range   Sodium 136  135 - 145 (mEq/L)   Potassium 4.1  3.5 - 5.1 (mEq/L)   Chloride 94 (*) 96 - 112 (mEq/L)   CO2 36 (*) 19 - 32 (mEq/L)   Glucose, Bld 130 (*) 70 - 99 (mg/dL)   BUN 26 (*) 6 - 23 (mg/dL)   Creatinine, Ser 4.78  0.50 - 1.10 (mg/dL)   Calcium 9.9  8.4 - 29.5 (mg/dL)   GFR calc non Af Amer >90  >90 (mL/min)   GFR calc Af Amer >90  >90 (mL/min)  GLUCOSE, CAPILLARY     Status: Normal   Collection Time   03/22/12  8:12 AM      Component Value Range   Glucose-Capillary 95  70 - 99 (mg/dL)  GLUCOSE, CAPILLARY     Status: Abnormal   Collection Time   03/22/12 12:15 PM      Component Value Range   Glucose-Capillary 152 (*) 70 - 99 (mg/dL)    Disposition: Home with oxygen   Follow-up Appts: Discharge Orders    Future Orders Please Complete By Expires   Diet - low sodium heart healthy      Diet Carb Modified  Increase activity slowly         Follow-up Information    Schedule an appointment as soon as possible for a visit with Josue Hector, MD.   Contact information:   [address] [place] [address] [phone]          I spent 40 minutes completing paperwork and coordinating discharge efforts.  SignedClydia Llano A 03/22/2012, 5:08 PM

## 2012-03-25 LAB — CULTURE, BLOOD (ROUTINE X 2): Culture  Setup Time: 201304010215

## 2012-09-04 ENCOUNTER — Emergency Department (HOSPITAL_COMMUNITY): Payer: Medicare Other

## 2012-09-04 ENCOUNTER — Encounter (HOSPITAL_COMMUNITY): Payer: Self-pay | Admitting: Emergency Medicine

## 2012-09-04 ENCOUNTER — Emergency Department (HOSPITAL_COMMUNITY)
Admission: EM | Admit: 2012-09-04 | Discharge: 2012-09-04 | Disposition: A | Discharge status: Home / Self Care | Payer: Medicare Other | Attending: Emergency Medicine | Admitting: Emergency Medicine

## 2012-09-04 DIAGNOSIS — Z888 Allergy status to other drugs, medicaments and biological substances status: Secondary | ICD-10-CM | POA: Insufficient documentation

## 2012-09-04 DIAGNOSIS — J45909 Unspecified asthma, uncomplicated: Secondary | ICD-10-CM | POA: Insufficient documentation

## 2012-09-04 DIAGNOSIS — M549 Dorsalgia, unspecified: Secondary | ICD-10-CM | POA: Insufficient documentation

## 2012-09-04 DIAGNOSIS — Z885 Allergy status to narcotic agent status: Secondary | ICD-10-CM | POA: Insufficient documentation

## 2012-09-04 DIAGNOSIS — E119 Type 2 diabetes mellitus without complications: Secondary | ICD-10-CM | POA: Insufficient documentation

## 2012-09-04 DIAGNOSIS — I10 Essential (primary) hypertension: Secondary | ICD-10-CM | POA: Insufficient documentation

## 2012-09-04 DIAGNOSIS — Z87891 Personal history of nicotine dependence: Secondary | ICD-10-CM | POA: Insufficient documentation

## 2012-09-04 MED ORDER — HYDROCODONE-ACETAMINOPHEN 5-325 MG PO TABS: 1.0000 | ORAL_TABLET | Freq: Three times a day (TID) | ORAL | Status: DC | PRN

## 2012-09-04 MED ORDER — HYDROCODONE-ACETAMINOPHEN 5-325 MG PO TABS
1.0000 | ORAL_TABLET | Freq: Once | ORAL | Status: AC
  Administered 2012-09-04: 1 via ORAL
  Filled 2012-09-04: qty 1

## 2012-09-04 NOTE — ED Notes (Signed)
NAD noted at time of d/c home 

## 2012-09-04 NOTE — ED Provider Notes (Signed)
History  This chart was scribed for Angelica Gaskins, MD by Ladona Ridgel Day. This patient was seen in room TR10C/TR10C and the patient's care was started at 1304.   CSN: [id number]  Arrival date & time 09/04/12  1304   First MD Initiated Contact with Patient 09/04/12 1359      Chief Complaint  Patient presents with  . Back Pain   Patient is a 66 y.o. female presenting with back pain. The history is provided by the patient. No language interpreter was used.  Back Pain  This is a recurrent problem. The current episode started 6 to 12 hours ago. The problem occurs constantly. The problem has been gradually worsening. The pain is associated with no known injury. The pain is present in the lumbar spine and gluteal region. The quality of the pain is described as shooting. The pain radiates to the left thigh. The pain is moderate. The symptoms are aggravated by bending and certain positions. The pain is the same all the time. Pertinent negatives include no chest pain, no fever, no abdominal pain, no bowel incontinence, no bladder incontinence, no paresis and no weakness. Treatments tried: hydrocodone. The treatment provided no relief.   Angelica [name] is a 66 y.o. female who presents to the Emergency Department with chronic back pain complaining of increased cosntant left hip/back pain radiating to her right thigh. She denies any new injuries to her back/hip. She has no incontinence, no dysuria, no abdominal pain.   Past Medical History  Diagnosis Date  . Diabetes mellitus   . Hypertension   . COPD (chronic obstructive pulmonary disease)   . Asthma   . Arthritis     Past Surgical History  Procedure Date  . Cesarean section   . Abdominal hysterectomy   . Total knee arthroplasty   . Back surgery   . Tonsillectomy   . Foot surgery   . Tubal ligation   . Cholecystectomy     Family History  Problem Relation Age of Onset  . Pneumonia Mother   . Lupus Mother   . Lung cancer Father      History  Substance Use Topics  . Smoking status: Former Smoker -- 1.0 packs/day for 30 years    Types: Cigarettes  . Smokeless tobacco: Not on file  . Alcohol Use: No    OB History    Grav Para Term Preterm Abortions TAB SAB Ect Mult Living                  Review of Systems  Constitutional: Negative for fever.  Cardiovascular: Negative for chest pain.  Gastrointestinal: Negative for vomiting, abdominal pain and bowel incontinence.  Genitourinary: Negative for bladder incontinence.  Musculoskeletal: Positive for back pain (left hip pain radiating to right thigh).  Neurological: Negative for weakness.    Allergies  Quinine derivatives; Morphine and related; and Rocephin  Home Medications   Current Outpatient Rx  Name Route Sig Dispense Refill  . ALBUTEROL SULFATE HFA 108 (90 BASE) MCG/ACT IN AERS Inhalation Inhale 2 puffs into the lungs every 6 (six) hours as needed. For shortness of breath    . ALBUTEROL SULFATE (5 MG/ML) 0.5% IN NEBU Nebulization Take 2.5 mg by nebulization every 6 (six) hours as needed. For shortness of breath    . CYCLOBENZAPRINE HCL 10 MG PO TABS Oral Take 10 mg by mouth 3 (three) times daily as needed. For muscle spasms    . DILTIAZEM HCL ER BEADS 120 MG PO CP24  Oral Take 120 mg by mouth at bedtime.    Marland Kitchen DIPHENHYDRAMINE-APAP (SLEEP) 25-500 MG PO TABS Oral Take 1 tablet by mouth at bedtime as needed. For sleep    . DOCUSATE SODIUM 100 MG PO CAPS Oral Take 100 mg by mouth at bedtime.    . DULOXETINE HCL 60 MG PO CPEP Oral Take 60 mg by mouth daily.    . ERGOCALCIFEROL 50000 UNITS PO CAPS Oral Take 50,000 Units by mouth once a week. On Saturdays    . ESTRADIOL 1 MG PO TABS Oral Take 1 mg by mouth at bedtime.    . FENOFIBRATE 160 MG PO TABS Oral Take 160 mg by mouth at bedtime.    Marland Kitchen FLUTICASONE-SALMETEROL 250-50 MCG/DOSE IN AEPB Inhalation Inhale 1 puff into the lungs every 12 (twelve) hours.    Marland Kitchen GLIPIZIDE ER 5 MG PO TB24 Oral Take 5 mg by mouth  daily.    . IBUPROFEN 200 MG PO TABS Oral Take 400 mg by mouth every 6 (six) hours as needed. For pain    . LISINOPRIL 10 MG PO TABS Oral Take 10 mg by mouth daily.    Marland Kitchen METFORMIN HCL ER (MOD) 500 MG PO TB24 Oral Take 500 mg by mouth 2 (two) times daily with a meal.    . OMEPRAZOLE 20 MG PO CPDR Oral Take 20 mg by mouth 2 (two) times daily.    Marland Kitchen SITAGLIPTIN PHOSPHATE 100 MG PO TABS Oral Take 100 mg by mouth daily.    . TRIAMTERENE-HCTZ 37.5-25 MG PO TABS Oral Take 1 tablet by mouth every morning.    Marland Kitchen VALSARTAN 160 MG PO TABS Oral Take 160 mg by mouth every morning.      Triage Vitals: BP 148/68  Pulse 103  Temp 98 F (36.7 C) (Oral)  Resp 16  SpO2 96%  Physical Exam CONSTITUTIONAL: Well developed/well nourished HEAD AND FACE: Normocephalic/atraumatic EYES: EOMI/PERRL ENMT: Mucous membranes moist NECK: supple no meningeal signs SPINE:entire spine nontender, No midline back tenderness CV: S1/S2 noted, no murmurs/rubs/gallops noted LUNGS: Lungs are clear to auscultation bilaterally, no apparent distress ABDOMEN: soft, nontender, no rebound or guarding GU:no cva tenderness NEURO: Awake/alert, equal distal motor: hip flexion/knee flexion/extension, ankle dorsi/plantar flexion,  no clonus bilaterally, no apparent sensory deficit in any dermatome.  Equal patellar/achilles reflex noted.  Pt is able to ambulate. EXTREMITIES: pulses normal, full ROM SKIN: warm, color normal, well healed scar on back PSYCH: no abnormalities of mood noted ED Course  Procedures  DIAGNOSTIC STUDIES: Oxygen Saturation is 96% on room air, adequate by my interpretation.    COORDINATION OF CARE: At 215 PM Discussed treatment plan with patient which includes pain medicine, lumbar X-ray. Patient agrees.    Xray at baseline.  No neuro deficits.  No urinary symptoms and patient reports similar to prior episodes of back pain.  She will f/u with her neurosurgeon this week.  Short course of pain meds  given.    MDM  Nursing notes including past medical history and social history reviewed and considered in documentation xrays reviewed and considered    I personally performed the services described in this documentation, which was scribed in my presence. The recorded information has been reviewed and considered.          Angelica Gaskins, MD 09/04/12 [phone]

## 2012-09-04 NOTE — ED Notes (Signed)
Pt c/o left lower back pain with radiation down left leg x weeks that is worse today; pt sts chronic back problems and denies any new injury

## 2012-09-04 NOTE — ED Notes (Signed)
MD in room at this time.

## 2014-12-19 ENCOUNTER — Encounter (HOSPITAL_COMMUNITY): Payer: Self-pay | Admitting: Emergency Medicine

## 2014-12-19 ENCOUNTER — Emergency Department (HOSPITAL_COMMUNITY)
Admission: EM | Admit: 2014-12-19 | Discharge: 2014-12-19 | Disposition: A | Discharge status: Home / Self Care | Payer: Medicare Other | Attending: Emergency Medicine | Admitting: Emergency Medicine

## 2014-12-19 DIAGNOSIS — Z9851 Tubal ligation status: Secondary | ICD-10-CM | POA: Insufficient documentation

## 2014-12-19 DIAGNOSIS — R2240 Localized swelling, mass and lump, unspecified lower limb: Secondary | ICD-10-CM | POA: Diagnosis present

## 2014-12-19 DIAGNOSIS — Z79899 Other long term (current) drug therapy: Secondary | ICD-10-CM | POA: Insufficient documentation

## 2014-12-19 DIAGNOSIS — E119 Type 2 diabetes mellitus without complications: Secondary | ICD-10-CM | POA: Diagnosis not present

## 2014-12-19 DIAGNOSIS — K611 Rectal abscess: Secondary | ICD-10-CM | POA: Diagnosis not present

## 2014-12-19 DIAGNOSIS — Z90710 Acquired absence of both cervix and uterus: Secondary | ICD-10-CM | POA: Diagnosis not present

## 2014-12-19 DIAGNOSIS — I10 Essential (primary) hypertension: Secondary | ICD-10-CM | POA: Insufficient documentation

## 2014-12-19 DIAGNOSIS — Z87891 Personal history of nicotine dependence: Secondary | ICD-10-CM | POA: Insufficient documentation

## 2014-12-19 DIAGNOSIS — J449 Chronic obstructive pulmonary disease, unspecified: Secondary | ICD-10-CM | POA: Diagnosis not present

## 2014-12-19 DIAGNOSIS — M1990 Unspecified osteoarthritis, unspecified site: Secondary | ICD-10-CM | POA: Diagnosis not present

## 2014-12-19 DIAGNOSIS — Z7951 Long term (current) use of inhaled steroids: Secondary | ICD-10-CM | POA: Insufficient documentation

## 2014-12-19 LAB — CBC WITH DIFFERENTIAL/PLATELET
BASOS ABS: 0 10*3/uL (ref 0.0–0.1)
BASOS PCT: 0 % (ref 0–1)
Eosinophils Absolute: 0.2 10*3/uL (ref 0.0–0.7)
Eosinophils Relative: 1 % (ref 0–5)
HEMATOCRIT: 28.1 % — AB (ref 36.0–46.0)
Hemoglobin: 9 g/dL — ABNORMAL LOW (ref 12.0–15.0)
Lymphocytes Relative: 8 % — ABNORMAL LOW (ref 12–46)
Lymphs Abs: 1.5 10*3/uL (ref 0.7–4.0)
MCH: 27.9 pg (ref 26.0–34.0)
MCHC: 32 g/dL (ref 30.0–36.0)
MCV: 87 fL (ref 78.0–100.0)
MONO ABS: 1 10*3/uL (ref 0.1–1.0)
Monocytes Relative: 5 % (ref 3–12)
NEUTROS ABS: 16.1 10*3/uL — AB (ref 1.7–7.7)
Neutrophils Relative %: 86 % — ABNORMAL HIGH (ref 43–77)
PLATELETS: 459 10*3/uL — AB (ref 150–400)
RBC: 3.23 MIL/uL — ABNORMAL LOW (ref 3.87–5.11)
RDW: 15.7 % — AB (ref 11.5–15.5)
WBC: 18.8 10*3/uL — AB (ref 4.0–10.5)

## 2014-12-19 LAB — BASIC METABOLIC PANEL
ANION GAP: 13 (ref 5–15)
BUN: 22 mg/dL (ref 6–23)
CALCIUM: 9.1 mg/dL (ref 8.4–10.5)
CHLORIDE: 95 meq/L — AB (ref 96–112)
CO2: 23 mmol/L (ref 19–32)
CREATININE: 0.97 mg/dL (ref 0.50–1.10)
GFR calc non Af Amer: 59 mL/min — ABNORMAL LOW (ref >90)
GFR, EST AFRICAN AMERICAN: 68 mL/min — AB (ref >90)
Glucose, Bld: 169 mg/dL — ABNORMAL HIGH (ref 70–99)
Potassium: 4.5 mmol/L (ref 3.5–5.1)
SODIUM: 131 mmol/L — AB (ref 135–145)

## 2014-12-19 MED ORDER — CLINDAMYCIN HCL 300 MG PO CAPS
300.0000 mg | ORAL_CAPSULE | Freq: Once | ORAL | Status: AC
  Administered 2014-12-19: 300 mg via ORAL
  Filled 2014-12-19 (×2): qty 1

## 2014-12-19 MED ORDER — LIDOCAINE-EPINEPHRINE (PF) 2 %-1:200000 IJ SOLN
20.0000 mL | Freq: Once | INTRAMUSCULAR | Status: DC
  Filled 2014-12-19: qty 20

## 2014-12-19 MED ORDER — HYDROCODONE-ACETAMINOPHEN 5-325 MG PO TABS: 2.0000 | ORAL_TABLET | ORAL | Status: DC | PRN

## 2014-12-19 MED ORDER — CLINDAMYCIN HCL 300 MG PO CAPS: 300.0000 mg | ORAL_CAPSULE | Freq: Four times a day (QID) | ORAL | Status: DC

## 2014-12-19 MED ORDER — FENTANYL CITRATE 0.05 MG/ML IJ SOLN
50.0000 ug | INTRAMUSCULAR | Status: DC | PRN
  Administered 2014-12-19: 50 ug via INTRAVENOUS
  Filled 2014-12-19: qty 2

## 2014-12-19 MED ORDER — METRONIDAZOLE 500 MG PO TABS: 500.0000 mg | ORAL_TABLET | Freq: Three times a day (TID) | ORAL | Status: DC

## 2014-12-19 MED ORDER — METRONIDAZOLE 500 MG PO TABS
500.0000 mg | ORAL_TABLET | Freq: Once | ORAL | Status: AC
  Administered 2014-12-19: 500 mg via ORAL
  Filled 2014-12-19: qty 1

## 2014-12-19 MED ORDER — ONDANSETRON HCL 4 MG/2ML IJ SOLN
4.0000 mg | Freq: Once | INTRAMUSCULAR | Status: AC
  Administered 2014-12-19: 4 mg via INTRAVENOUS
  Filled 2014-12-19: qty 2

## 2014-12-19 NOTE — ED Provider Notes (Signed)
CSN: [id number]     Arrival date & time 12/19/14  1343 History   First MD Initiated Contact with Patient 12/19/14 1419     Chief Complaint  Patient presents with  . Groin Swelling      HPI  Impression presents for evaluation of groin pain. States she's had the pain up her aspect of her left leg "by my rectum". States been putting cortisone and alcohol and "salve" on it. Continues become large and painful. She felt that made her clothing and been rubbing on her first few days but states "esophagus to get more red and swollen". No history of perirectal abnormalities.  Past Medical History  Diagnosis Date  . Diabetes mellitus   . Hypertension   . COPD (chronic obstructive pulmonary disease)   . Asthma   . Arthritis    Past Surgical History  Procedure Laterality Date  . Cesarean section    . Abdominal hysterectomy    . Total knee arthroplasty    . Back surgery    . Tonsillectomy    . Foot surgery    . Tubal ligation    . Cholecystectomy     Family History  Problem Relation Age of Onset  . Pneumonia Mother   . Lupus Mother   . Lung cancer Father    History  Substance Use Topics  . Smoking status: Former Smoker -- 1.00 packs/day for 30 years    Types: Cigarettes  . Smokeless tobacco: Not on file  . Alcohol Use: No   OB History    No data available     Review of Systems  Constitutional: Negative for fever, chills, diaphoresis, appetite change and fatigue.  HENT: Negative for mouth sores, sore throat and trouble swallowing.   Eyes: Negative for visual disturbance.  Respiratory: Negative for cough, chest tightness, shortness of breath and wheezing.   Cardiovascular: Negative for chest pain.  Gastrointestinal: Positive for rectal pain. Negative for nausea, vomiting, abdominal pain, diarrhea and abdominal distention.  Endocrine: Negative for polydipsia, polyphagia and polyuria.  Genitourinary: Negative for dysuria, frequency and hematuria.  Musculoskeletal: Negative for  gait problem.  Skin: Negative for color change, pallor and rash.  Neurological: Negative for dizziness, syncope, light-headedness and headaches.  Hematological: Does not bruise/bleed easily.  Psychiatric/Behavioral: Negative for behavioral problems and confusion.      Allergies  Quinine derivatives; Morphine and related; and Rocephin  Home Medications   Prior to Admission medications   Medication Sig Start Date End Date Taking? Authorizing Provider  albuterol (PROAIR HFA) 108 (90 BASE) MCG/ACT inhaler Inhale 2 puffs into the lungs every 6 (six) hours as needed. For shortness of breath    Historical Provider, MD  albuterol (PROVENTIL) (5 MG/ML) 0.5% nebulizer solution Take 2.5 mg by nebulization every 6 (six) hours as needed. For shortness of breath 03/05/12 03/05/13  Erick Blinks, MD  clindamycin (CLEOCIN) 300 MG capsule Take 1 capsule (300 mg total) by mouth every 6 (six) hours. 12/19/14   Rolland Porter, MD  cyclobenzaprine (FLEXERIL) 10 MG tablet Take 10 mg by mouth 3 (three) times daily as needed. For muscle spasms    Historical Provider, MD  diltiazem (TIAZAC) 120 MG 24 hr capsule Take 120 mg by mouth at bedtime.    Historical Provider, MD  diphenhydramine-acetaminophen (TYLENOL PM) 25-500 MG TABS Take 1 tablet by mouth at bedtime as needed. For sleep    Historical Provider, MD  docusate sodium (COLACE) 100 MG capsule Take 100 mg by mouth at bedtime.  Historical Provider, MD  DULoxetine (CYMBALTA) 60 MG capsule Take 60 mg by mouth daily.    Historical Provider, MD  ergocalciferol (VITAMIN D2) 50000 UNITS capsule Take 50,000 Units by mouth once a week. On Saturdays    Historical Provider, MD  estradiol (ESTRACE) 1 MG tablet Take 1 mg by mouth at bedtime.    Historical Provider, MD  fenofibrate 160 MG tablet Take 160 mg by mouth at bedtime.    Historical Provider, MD  Fluticasone-Salmeterol (ADVAIR DISKUS) 250-50 MCG/DOSE AEPB Inhale 1 puff into the lungs every 12 (twelve) hours.     Historical Provider, MD  glipiZIDE (GLUCOTROL XL) 5 MG 24 hr tablet Take 5 mg by mouth daily.    Historical Provider, MD  HYDROcodone-acetaminophen (NORCO/VICODIN) 5-325 MG per tablet Take 2 tablets by mouth every 4 (four) hours as needed. 12/19/14   Rolland Porter, MD  ibuprofen (ADVIL,MOTRIN) 200 MG tablet Take 400 mg by mouth every 6 (six) hours as needed. For pain    Historical Provider, MD  lisinopril (PRINIVIL,ZESTRIL) 10 MG tablet Take 10 mg by mouth daily.    Historical Provider, MD  metFORMIN (GLUMETZA) 500 MG (MOD) 24 hr tablet Take 500 mg by mouth 2 (two) times daily with a meal.    Historical Provider, MD  metroNIDAZOLE (FLAGYL) 500 MG tablet Take 1 tablet (500 mg total) by mouth 3 (three) times daily. 12/19/14   Rolland Porter, MD  omeprazole (PRILOSEC) 20 MG capsule Take 20 mg by mouth 2 (two) times daily.    Historical Provider, MD  sitaGLIPtin (JANUVIA) 100 MG tablet Take 100 mg by mouth daily.    Historical Provider, MD  triamterene-hydrochlorothiazide (MAXZIDE-25) 37.5-25 MG per tablet Take 1 tablet by mouth every morning.    Historical Provider, MD  valsartan (DIOVAN) 160 MG tablet Take 160 mg by mouth every morning.    Historical Provider, MD   BP 144/76 mmHg  Pulse 108  Temp(Src) 98.5 F (36.9 C) (Oral)  Resp 18  SpO2 97% Physical Exam  Constitutional: She is oriented to person, place, and time. She appears well-developed and well-nourished. No distress.  HENT:  Head: Normocephalic.  Eyes: Conjunctivae are normal. Pupils are equal, round, and reactive to light. No scleral icterus.  Neck: Normal range of motion. Neck supple. No thyromegaly present.  Cardiovascular: Normal rate and regular rhythm.  Exam reveals no gallop and no friction rub.   No murmur heard. Pulmonary/Chest: Effort normal and breath sounds normal. No respiratory distress. She has no wheezes. She has no rales.  Abdominal: Soft. Bowel sounds are normal. She exhibits no distension. There is no tenderness. There is no  rebound.  Genitourinary:     Musculoskeletal: Normal range of motion.  Neurological: She is alert and oriented to person, place, and time.  Skin: Skin is warm and dry. No rash noted.  Psychiatric: She has a normal mood and affect. Her behavior is normal.    ED Course  INCISION AND DRAINAGE Date/Time: 12/19/2014 4:16 PM Performed by: Rolland Porter Authorized by: Rolland Porter Consent: Verbal consent obtained. Written consent not obtained. Risks and benefits: risks, benefits and alternatives were discussed Consent given by: patient Patient understanding: patient states understanding of the procedure being performed Type: abscess Body area: anogenital Location details: perianal Anesthesia: local infiltration Local anesthetic: lidocaine 2% with epinephrine Anesthetic total: 6 ml Scalpel size: 11 Incision type: elliptical Complexity: simple Drainage: purulent Drainage amount: moderate Wound treatment: drain placed Packing material: 1/4 in iodoform gauze Patient tolerance: Patient tolerated the  procedure well with no immediate complications   (including critical care time) Labs Review Labs Reviewed  CBC WITH DIFFERENTIAL - Abnormal; Notable for the following:    WBC 18.8 (*)    RBC 3.23 (*)    Hemoglobin 9.0 (*)    HCT 28.1 (*)    RDW 15.7 (*)    Platelets 459 (*)    Neutrophils Relative % 86 (*)    Neutro Abs 16.1 (*)    Lymphocytes Relative 8 (*)    All other components within normal limits  BASIC METABOLIC PANEL - Abnormal; Notable for the following:    Sodium 131 (*)    Chloride 95 (*)    Glucose, Bld 169 (*)    GFR calc non Af Amer 59 (*)    GFR calc Af Amer 68 (*)    All other components within normal limits      Imaging Review No results found.   EKG Interpretation None      MDM   Final diagnoses:  Perirectal abscess    Large perirectal abscess extending to the posterior aspect of the labia on the left. No sign of skin breakdown or Fournier's. No  anterior abdominal wall abdomen is no inguinal abnormalities. I discussed the case briefly with Dr. Luisa Hart of Gen. surgery. I feel comfortable doing incision and drainage in the emergency room. He was agreeable to seeing the patient in follow-up on Monday to ensure that this is resolving and for further consideration for additional packing or procedures. Patient is a couple. Tolerated procedure well. Discharged home. Prescriptions for Flagyl and clindamycin. Vicodin for pain.    Rolland Porter, MD 12/19/14 [phone]

## 2014-12-19 NOTE — ED Notes (Signed)
Pt ate last at 930 am.

## 2014-12-19 NOTE — ED Notes (Signed)
Pt c/o pain and possible abscess to left upper leg close to buttocks x 5 days

## 2014-12-19 NOTE — Discharge Instructions (Signed)
Peri-Rectal Abscess  Your caregiver has diagnosed you as having a peri-rectal abscess. This is an infected area near the rectum that is filled with pus. If the abscess is near the surface of the skin, your caregiver may open (incise) the area and drain the pus.  HOME CARE INSTRUCTIONS    If your abscess was opened up and drained. A small piece of gauze may be placed in the opening so that it can drain. Do not remove the gauze unless directed by your caregiver.   A loose dressing may be placed over the abscess site. Change the dressing as often as necessary to keep it clean and dry.   After the drain is removed, the area may be washed with a gentle antiseptic (soap) four times per day.   A warm sitz bath, warm packs or heating pad may be used for pain relief, taking care not to burn yourself.   Return for a wound check in 1 day or as directed.   An "inflatable doughnut" may be used for sitting with added comfort. These can be purchased at a drugstore or medical supply house.   To reduce pain and straining with bowel movements, eat a high fiber diet with plenty of fruits and vegetables. Use stool softeners as recommended by your caregiver. This is especially important if narcotic type pain medications were prescribed as these may cause marked constipation.   Only take over-the-counter or prescription medicines for pain, discomfort, or fever as directed by your caregiver.  SEEK IMMEDIATE MEDICAL CARE IF:    You have increasing pain that is not controlled by medication.   There is increased inflammation (redness), swelling, bleeding, or drainage from the area.   An oral temperature above 102 F (38.9 C) develops.   You develop chills or generalized malaise (feel lethargic or feel "washed out").   You develop any new symptoms (problems) you feel may be related to your present problem.  Document Released: 12/02/2000 Document Revised: 02/27/2012 Document Reviewed: 12/02/2008  [employer] Patient Information  2015 [employer]. This information is not intended to replace advice given to you by your health care provider. Make sure you discuss any questions you have with your health care provider.

## 2014-12-19 NOTE — ED Notes (Signed)
MD at bedside. 

## 2017-10-04 ENCOUNTER — Other Ambulatory Visit: Payer: Medicare Other

## 2017-10-04 ENCOUNTER — Ambulatory Visit: Payer: Medicare Other | Admitting: Family

## 2017-10-05 ENCOUNTER — Other Ambulatory Visit: Payer: Self-pay | Admitting: Family

## 2017-10-05 DIAGNOSIS — D649 Anemia, unspecified: Secondary | ICD-10-CM

## 2017-10-06 ENCOUNTER — Other Ambulatory Visit (HOSPITAL_BASED_OUTPATIENT_CLINIC_OR_DEPARTMENT_OTHER): Payer: Medicare Other

## 2017-10-06 ENCOUNTER — Ambulatory Visit (HOSPITAL_BASED_OUTPATIENT_CLINIC_OR_DEPARTMENT_OTHER): Payer: Medicare Other | Admitting: Family

## 2017-10-06 DIAGNOSIS — K909 Intestinal malabsorption, unspecified: Secondary | ICD-10-CM

## 2017-10-06 DIAGNOSIS — R718 Other abnormality of red blood cells: Secondary | ICD-10-CM

## 2017-10-06 DIAGNOSIS — D508 Other iron deficiency anemias: Secondary | ICD-10-CM

## 2017-10-06 DIAGNOSIS — D649 Anemia, unspecified: Secondary | ICD-10-CM

## 2017-10-06 LAB — CMP (CANCER CENTER ONLY)
ALK PHOS: 56 U/L (ref 26–84)
ALT: 20 U/L (ref 10–47)
AST: 21 U/L (ref 11–38)
Albumin: 3.5 g/dL (ref 3.3–5.5)
BILIRUBIN TOTAL: 0.4 mg/dL (ref 0.20–1.60)
BUN: 16 mg/dL (ref 7–22)
CO2: 29 meq/L (ref 18–33)
Calcium: 9.7 mg/dL (ref 8.0–10.3)
Chloride: 99 mEq/L (ref 98–108)
Creat: 0.7 mg/dl (ref 0.6–1.2)
GLUCOSE: 228 mg/dL — AB (ref 73–118)
Potassium: 3.8 mEq/L (ref 3.3–4.7)
SODIUM: 140 meq/L (ref 128–145)
Total Protein: 7.5 g/dL (ref 6.4–8.1)

## 2017-10-06 LAB — CBC WITH DIFFERENTIAL (CANCER CENTER ONLY)
BASO#: 0.1 10*3/uL (ref 0.0–0.2)
BASO%: 0.8 % (ref 0.0–2.0)
EOS%: 2.1 % (ref 0.0–7.0)
Eosinophils Absolute: 0.2 10*3/uL (ref 0.0–0.5)
HEMATOCRIT: 33.2 % — AB (ref 34.8–46.6)
HGB: 10.4 g/dL — ABNORMAL LOW (ref 11.6–15.9)
LYMPH#: 2.1 10*3/uL (ref 0.9–3.3)
LYMPH%: 27.7 % (ref 14.0–48.0)
MCH: 28.3 pg (ref 26.0–34.0)
MCHC: 31.3 g/dL — AB (ref 32.0–36.0)
MCV: 90 fL (ref 81–101)
MONO#: 0.6 10*3/uL (ref 0.1–0.9)
MONO%: 8.2 % (ref 0.0–13.0)
NEUT%: 61.2 % (ref 39.6–80.0)
NEUTROS ABS: 4.6 10*3/uL (ref 1.5–6.5)
Platelets: 548 10*3/uL — ABNORMAL HIGH (ref 145–400)
RBC: 3.68 10*6/uL — AB (ref 3.70–5.32)
RDW: 15.6 % (ref 11.1–15.7)
WBC: 7.6 10*3/uL (ref 3.9–10.0)

## 2017-10-06 LAB — CHCC SATELLITE - SMEAR

## 2017-10-06 NOTE — Progress Notes (Signed)
Hematology/Oncology Consultation   Name: Angelica [name]      MRN: [id number]    Location: Room/bed info not found  Date: 10/06/2017 Time:2:54 PM   REFERRING PHYSICIAN: Joette CatchingLeonard Nyland, MD  REASON FOR CONSULT: Anemia    DIAGNOSIS:  Iron deficiency anemia   HISTORY OF PRESENT ILLNESS: [name] is a very pleasant 71 yo caucasian female with iron deficiency anemia. Her iron saturation a month ago was 10%. She started taking an oral iron supplement but states that this has caused a lot of GI upset and constipation.  She is also on Prilosec which would certainly effect the absorption of iron.  She is symptomatic at this time with fatigue and "brain fog". She was a patient of Dr. Gustavo LahEnnever's 17-18 years ago for thrombocytopenia due to quinine. This resolved and she has not had to follow-up with our office since that time.  Platelet count is mildly elevated at 548. She has had no episodes of bleeding or petechiae.  She does bruise easily but not in excess.  She state that her sister is also anemic and has required IV iron and her daughter was anemic during pregnancy.  She is diabetic and takes Glucotrol XL, Januvia and Metformin. She states that her blood sugars are fairly well controlled.  She states that her last colonoscopy was a little over a year ago and she had 1 benign polyp removed.  She is overdue for her mammogram.  No fever, chills, n/v, cough, rash, dizziness, chest pain, palpitations, abdominal pain or changes in bowel or bladder habits.  She takes a stool softener as needed for constipation.  She has some chronic lower back pain with history of 3 back surgeries in the past. She takes an NSAID sparingly with food for pain.  No person al cancer history. Family history includes: father - lung (smoker and asbestos exposure), paternal cousin - lung and paternal cousin - unknown primary, metastatic.  No history of thrombus.  She has one daughter and had 1 miscarriage very early on in her  pregnancy.  She had a total hysterectomy years ago due to severe endometriosis. She is on Estrace.  She has some SOB with over exertion due to COPD.  She quit smoking cigarette 5 years ago and now she uses an e-cigarette. No ETOH use. Unfortunately, her husband passed away earlier this year and she has been a little depressed missing him. She is spending time with her family and trying to find a new breakfast spot since her favorite one just closed.    ROS: All other 10 point review of systems is negative.   PAST MEDICAL HISTORY:   Past Medical History:  Diagnosis Date  . Arthritis   . Asthma   . COPD (chronic obstructive pulmonary disease)   . Diabetes mellitus   . Hypertension     ALLERGIES: Allergies  Allergen Reactions  . Quinine Derivatives     Decreased platelet counts  . Morphine And Related Rash  . Rocephin [Ceftriaxone Sodium In Dextrose] Rash      MEDICATIONS:  Current Outpatient Prescriptions on File Prior to Visit  Medication Sig Dispense Refill  . albuterol (PROAIR HFA) 108 (90 BASE) MCG/ACT inhaler Inhale 2 puffs into the lungs every 6 (six) hours as needed. For shortness of breath    . albuterol (PROVENTIL) (5 MG/ML) 0.5% nebulizer solution Take 2.5 mg by nebulization every 6 (six) hours as needed. For shortness of breath    . clindamycin (CLEOCIN) 300 MG capsule Take  1 capsule (300 mg total) by mouth every 6 (six) hours. 21 capsule 0  . cyclobenzaprine (FLEXERIL) 10 MG tablet Take 10 mg by mouth 3 (three) times daily as needed. For muscle spasms    . diltiazem (TIAZAC) 120 MG 24 hr capsule Take 120 mg by mouth at bedtime.    . diphenhydramine-acetaminophen (TYLENOL PM) 25-500 MG TABS Take 1 tablet by mouth at bedtime as needed. For sleep    . docusate sodium (COLACE) 100 MG capsule Take 100 mg by mouth at bedtime.    . DULoxetine (CYMBALTA) 60 MG capsule Take 60 mg by mouth daily.    . ergocalciferol (VITAMIN D2) 50000 UNITS capsule Take 50,000 Units by mouth  once a week. On Saturdays    . estradiol (ESTRACE) 1 MG tablet Take 1 mg by mouth at bedtime.    . fenofibrate 160 MG tablet Take 160 mg by mouth at bedtime.    . Fluticasone-Salmeterol (ADVAIR DISKUS) 250-50 MCG/DOSE AEPB Inhale 1 puff into the lungs every 12 (twelve) hours.    Marland Kitchen glipiZIDE (GLUCOTROL XL) 5 MG 24 hr tablet Take 5 mg by mouth daily.    Marland Kitchen HYDROcodone-acetaminophen (NORCO/VICODIN) 5-325 MG per tablet Take 2 tablets by mouth every 4 (four) hours as needed. 10 tablet 0  . ibuprofen (ADVIL,MOTRIN) 200 MG tablet Take 400 mg by mouth every 6 (six) hours as needed. For pain    . lisinopril (PRINIVIL,ZESTRIL) 10 MG tablet Take 10 mg by mouth daily.    . metFORMIN (GLUMETZA) 500 MG (MOD) 24 hr tablet Take 500 mg by mouth 2 (two) times daily with a meal.    . metroNIDAZOLE (FLAGYL) 500 MG tablet Take 1 tablet (500 mg total) by mouth 3 (three) times daily. 21 tablet 0  . omeprazole (PRILOSEC) 20 MG capsule Take 20 mg by mouth 2 (two) times daily.    . sitaGLIPtin (JANUVIA) 100 MG tablet Take 100 mg by mouth daily.    Marland Kitchen triamterene-hydrochlorothiazide (MAXZIDE-25) 37.5-25 MG per tablet Take 1 tablet by mouth every morning.    . valsartan (DIOVAN) 160 MG tablet Take 160 mg by mouth every morning.     No current facility-administered medications on file prior to visit.      PAST SURGICAL HISTORY Past Surgical History:  Procedure Laterality Date  . ABDOMINAL HYSTERECTOMY    . BACK SURGERY    . CESAREAN SECTION    . CHOLECYSTECTOMY    . FOOT SURGERY    . TONSILLECTOMY    . TOTAL KNEE ARTHROPLASTY    . TUBAL LIGATION      FAMILY HISTORY: Family History  Problem Relation Age of Onset  . Pneumonia Mother   . Lupus Mother   . Lung cancer Father     SOCIAL HISTORY:  reports that she has quit smoking. Her smoking use included Cigarettes. She has a 30.00 pack-year smoking history. She does not have any smokeless tobacco history on file. She reports that she does not drink alcohol or  use drugs.  PERFORMANCE STATUS: The patient's performance status is 1 - Symptomatic but completely ambulatory  PHYSICAL EXAM: Most Recent Vital Signs: There were no vitals taken for this visit. There were no vitals taken for this visit.  General Appearance:    Alert, cooperative, no distress, appears stated age  Head:    Normocephalic, without obvious abnormality, atraumatic  Eyes:    PERRL, conjunctiva/corneas clear, EOM's intact, fundi    benign, both eyes  Throat:   Lips, mucosa, and tongue normal; teeth and gums normal  Neck:   Supple, symmetrical, trachea midline, no adenopathy;    thyroid:  no enlargement/tenderness/nodules; no carotid   bruit or JVD  Back:     Symmetric, no curvature, ROM normal, no CVA tenderness  Lungs:     Clear to auscultation bilaterally, respirations unlabored  Chest Wall:    No tenderness or deformity   Heart:    Regular rate and rhythm, S1 and S2 normal, no murmur, rub   or gallop     Abdomen:     Soft, non-tender, bowel sounds active all four quadrants,    no masses, no organomegaly        Extremities:   Extremities normal, atraumatic, no cyanosis or edema  Pulses:   2+ and symmetric all extremities  Skin:   Skin color, texture, turgor normal, no rashes or lesions  Lymph nodes:   Cervical, supraclavicular, and axillary nodes normal  Neurologic:   CNII-XII intact, normal strength, sensation and reflexes    throughout    LABORATORY DATA:  Results for orders placed or performed in visit on 10/06/17 (from the past 48 hour(s))  CBC w/Diff     Status: Abnormal   Collection Time: 10/06/17  2:44 PM  Result Value Ref Range   WBC 7.6 3.9 - 10.0 10e3/uL   RBC 3.68 (L) 3.70 - 5.32 10e6/uL   HGB 10.4 (L) 11.6 - 15.9 g/dL   HCT 09.8 (L) 11.9 - 14.7 %   MCV 90 81 - 101 fL   MCH 28.3 26.0 - 34.0 pg   MCHC 31.3 (L) 32.0 - 36.0 g/dL   RDW 82.9 56.2 - 13.0 %   Platelets 548 (H) 145 - 400 10e3/uL   NEUT# 4.6 1.5 - 6.5 10e3/uL   LYMPH# 2.1 0.9 -  3.3 10e3/uL   MONO# 0.6 0.1 - 0.9 10e3/uL   Eosinophils Absolute 0.2 0.0 - 0.5 10e3/uL   BASO# 0.1 0.0 - 0.2 10e3/uL   NEUT% 61.2 39.6 - 80.0 %   LYMPH% 27.7 14.0 - 48.0 %   MONO% 8.2 0.0 - 13.0 %   EOS% 2.1 0.0 - 7.0 %   BASO% 0.8 0.0 - 2.0 %      RADIOGRAPHY: No results found.   PATHOLOGY: None  ASSESSMENT/PLAN: Ms. Keplinger is a very pleasant 71 yo caucasian female with iron deficiency anemia secondary to malabsorption. Her iron saturation a month ago was 10%. She failed oral iron due to GI upset and constipation. She is symptomatic at this time with fatigue and brain fog.  We will see what her iron studies show and bring her back in next week for an infusion if needed.  We will plan to see her back again in another 6 weeks for repeat lab work and follow-up.   All questions were answered and she is in agreement with the plan. She wil contact our office with any questions or concerns. We can certainly see her sooner if need be  She was discussed with and also seen by Dr. Myna Hidalgo and he is in agreement with the aforementioned.   [facility]       Addendum: I saw and examined the patient with Trenell Concannon.  I agree with the above assessment.  Her iron studies clearly are quite low.  Her ferritin is only 10 with an iron saturation of 7%.  She will clearly benefit from IV iron.  With the holidays coming up, I want to  make sure that she is able to have enough energy to be able to enjoy traveling and being with family.  I looked at her blood smear.  She had hypochromic and microcytic red blood cells.  There was some anisocytosis.  Her white blood cells look okay.  Her platelets also look relatively uniform.  I saw no nucleated red blood cells.  We spent about 40 minutes with her.  We answered her questions.  She was very confident and felt much more comfortable after seeing [place].  We will plan to get her back to see her in another month or so.  Christin Bach, MD

## 2017-10-07 DIAGNOSIS — D509 Iron deficiency anemia, unspecified: Secondary | ICD-10-CM | POA: Insufficient documentation

## 2017-10-07 LAB — ERYTHROPOIETIN: Erythropoietin: 33.7 m[IU]/mL — ABNORMAL HIGH (ref 2.6–18.5)

## 2017-10-07 LAB — RETICULOCYTES: RETICULOCYTE COUNT: 1.4 % (ref 0.6–2.6)

## 2017-10-09 LAB — FERRITIN: Ferritin: 10 ng/ml (ref 9–269)

## 2017-10-09 LAB — IRON AND TIBC
%SAT: 7 % — AB (ref 21–57)
Iron: 32 ug/dL — ABNORMAL LOW (ref 41–142)
TIBC: 476 ug/dL — AB (ref 236–444)
UIBC: 444 ug/dL — AB (ref 120–384)

## 2017-10-09 LAB — LACTATE DEHYDROGENASE: LDH: 127 U/L (ref 125–245)

## 2017-10-10 ENCOUNTER — Other Ambulatory Visit: Payer: Medicare Other

## 2017-10-10 ENCOUNTER — Ambulatory Visit: Payer: Medicare Other | Admitting: Family

## 2017-10-12 ENCOUNTER — Other Ambulatory Visit: Payer: Self-pay | Admitting: Family

## 2017-10-26 ENCOUNTER — Other Ambulatory Visit: Payer: Self-pay

## 2017-10-26 ENCOUNTER — Ambulatory Visit (HOSPITAL_BASED_OUTPATIENT_CLINIC_OR_DEPARTMENT_OTHER): Payer: Medicare Other

## 2017-10-26 VITALS — BP 149/65 | HR 86 | Temp 98.1°F | Resp 18

## 2017-10-26 DIAGNOSIS — K909 Intestinal malabsorption, unspecified: Secondary | ICD-10-CM | POA: Diagnosis not present

## 2017-10-26 DIAGNOSIS — D508 Other iron deficiency anemias: Secondary | ICD-10-CM

## 2017-10-26 MED ORDER — SODIUM CHLORIDE 0.9 % IV SOLN
Freq: Once | INTRAVENOUS | Status: AC
  Administered 2017-10-26: 10:00:00 via INTRAVENOUS

## 2017-10-26 MED ORDER — SODIUM CHLORIDE 0.9 % IV SOLN
510.0000 mg | Freq: Once | INTRAVENOUS | Status: AC
  Administered 2017-10-26: 510 mg via INTRAVENOUS
  Filled 2017-10-26: qty 17

## 2017-10-26 NOTE — Patient Instructions (Signed)

## 2017-10-31 ENCOUNTER — Ambulatory Visit (HOSPITAL_BASED_OUTPATIENT_CLINIC_OR_DEPARTMENT_OTHER): Payer: Medicare Other

## 2017-10-31 VITALS — BP 154/46 | HR 102 | Temp 97.9°F | Resp 16

## 2017-10-31 DIAGNOSIS — D508 Other iron deficiency anemias: Secondary | ICD-10-CM

## 2017-10-31 DIAGNOSIS — K909 Intestinal malabsorption, unspecified: Secondary | ICD-10-CM

## 2017-10-31 MED ORDER — SODIUM CHLORIDE 0.9 % IV SOLN
510.0000 mg | Freq: Once | INTRAVENOUS | Status: AC
  Administered 2017-10-31: 510 mg via INTRAVENOUS
  Filled 2017-10-31: qty 17

## 2017-10-31 MED ORDER — SODIUM CHLORIDE 0.9 % IV SOLN
Freq: Once | INTRAVENOUS | Status: AC
  Administered 2017-10-31: 10:00:00 via INTRAVENOUS

## 2017-10-31 NOTE — Patient Instructions (Signed)

## 2017-11-02 ENCOUNTER — Ambulatory Visit: Payer: Medicare Other

## 2017-11-17 ENCOUNTER — Other Ambulatory Visit: Payer: Medicare Other

## 2017-11-17 ENCOUNTER — Other Ambulatory Visit: Payer: Self-pay

## 2017-11-17 ENCOUNTER — Ambulatory Visit (HOSPITAL_BASED_OUTPATIENT_CLINIC_OR_DEPARTMENT_OTHER): Payer: Medicare Other | Admitting: Family

## 2017-11-17 ENCOUNTER — Encounter: Payer: Self-pay | Admitting: Family

## 2017-11-17 VITALS — BP 158/53 | HR 96 | Temp 98.5°F | Resp 18 | Wt 201.0 lb

## 2017-11-17 DIAGNOSIS — D508 Other iron deficiency anemias: Secondary | ICD-10-CM

## 2017-11-17 DIAGNOSIS — K909 Intestinal malabsorption, unspecified: Secondary | ICD-10-CM

## 2017-11-17 LAB — CBC WITH DIFFERENTIAL (CANCER CENTER ONLY)
BASO#: 0 10*3/uL (ref 0.0–0.2)
BASO%: 0.4 % (ref 0.0–2.0)
EOS%: 1.3 % (ref 0.0–7.0)
Eosinophils Absolute: 0.1 10*3/uL (ref 0.0–0.5)
HEMATOCRIT: 32.7 % — AB (ref 34.8–46.6)
HEMOGLOBIN: 10.3 g/dL — AB (ref 11.6–15.9)
LYMPH#: 1.7 10*3/uL (ref 0.9–3.3)
LYMPH%: 22.2 % (ref 14.0–48.0)
MCH: 29.2 pg (ref 26.0–34.0)
MCHC: 31.5 g/dL — AB (ref 32.0–36.0)
MCV: 93 fL (ref 81–101)
MONO#: 0.7 10*3/uL (ref 0.1–0.9)
MONO%: 8.8 % (ref 0.0–13.0)
NEUT%: 67.3 % (ref 39.6–80.0)
NEUTROS ABS: 5.3 10*3/uL (ref 1.5–6.5)
Platelets: 467 10*3/uL — ABNORMAL HIGH (ref 145–400)
RBC: 3.53 10*6/uL — ABNORMAL LOW (ref 3.70–5.32)
RDW: 17.5 % — AB (ref 11.1–15.7)
WBC: 7.8 10*3/uL (ref 3.9–10.0)

## 2017-11-17 LAB — IRON AND TIBC
%SAT: 13 % — ABNORMAL LOW (ref 21–57)
IRON: 46 ug/dL (ref 41–142)
TIBC: 346 ug/dL (ref 236–444)
UIBC: 301 ug/dL (ref 120–384)

## 2017-11-17 LAB — FERRITIN: Ferritin: 199 ng/ml (ref 9–269)

## 2017-11-17 NOTE — Progress Notes (Signed)
Hematology and Oncology Follow Up Visit  Angelica [name] [id number] 03/25/1946 71 y.o. 11/17/2017   Principle Diagnosis:  Iron deficiency anemia  Current Therapy:   IV iron as indicated - last received 2 doses in November 2018    Interim History:  Angelica [name] is here today for follow-up. She is feeling better since receiving 2 doses of IV iron earlier this month. She can tell that her energy is better.  She has had a bout with bronchitis and is currently on a Prednisone taper. She has had some SOB and a dry cough with this.  No fever, chills, n/v, rash, dizziness, chest pain, palpitations, abdominal pain or changes in bowel or bladder habits.  No episodes of bleeding, bruising or petechiae. No lymphadenopathy found on exam.  She is on Lasix for the swelling in her feet and ankles. 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 No tenderness in her extremities. No c/o pain at this time.   She has maintained a good appetite and is staying well hydrated. Her weight is stable.   ECOG Performance Status: 1 - Symptomatic but completely ambulatory  Medications:  Allergies as of 11/17/2017      Reactions   Amoxicillin-pot Clavulanate Other (See Comments)   Ceftriaxone Rash   Morphine And Related Rash   Quinine Other (See Comments)   Rocephin [ceftriaxone Sodium In Dextrose] Rash, Other (See Comments)   Quinine Derivatives    Decreased platelet counts      Medication List        Accurate as of 11/17/17 11:29 AM. Always use your most recent med list.          ADVAIR DISKUS 250-50 MCG/DOSE Aepb Generic drug:  Fluticasone-Salmeterol Inhale 1 puff into the lungs every 12 (twelve) hours.   albuterol (2.5 MG/3ML) 0.083% nebulizer solution Commonly known as:  PROVENTIL   cyclobenzaprine 10 MG tablet Commonly known as:  FLEXERIL Take 10 mg by mouth 3 (three) times daily as needed. For muscle spasms   diltiazem 120 MG 24 hr capsule Commonly known as:  TIAZAC Take  120 mg by mouth at bedtime.   diltiazem 120 MG 24 hr capsule Commonly known as:  CARDIZEM CD TAKE ONE (1) CAPSULE EACH DAY   diphenhydramine-acetaminophen 25-500 MG Tabs tablet Commonly known as:  TYLENOL PM Take 1 tablet by mouth at bedtime as needed. For sleep   docusate sodium 100 MG capsule Commonly known as:  COLACE Take 100 mg by mouth at bedtime.   DULoxetine 60 MG capsule Commonly known as:  CYMBALTA Take 60 mg by mouth daily.   ergocalciferol 50000 units capsule Commonly known as:  VITAMIN D2 Take 50,000 Units by mouth once a week. On Saturdays   estradiol 1 MG tablet Commonly known as:  ESTRACE Take 1 mg by mouth at bedtime.   fenofibrate 160 MG tablet Take 160 mg by mouth at bedtime.   furosemide 20 MG tablet Commonly known as:  LASIX TAKE ONE (1) TABLET EACH DAY   glipiZIDE 5 MG 24 hr tablet Commonly known as:  GLUCOTROL XL Take 5 mg by mouth daily.   HYDROcodone-acetaminophen 5-325 MG tablet Commonly known as:  NORCO/VICODIN Take 2 tablets by mouth every 4 (four) hours as needed.   ibuprofen 200 MG tablet Commonly known as:  ADVIL,MOTRIN Take 400 mg by mouth every 6 (six) hours as needed. For pain   lisinopril 10 MG tablet Commonly known as:  PRINIVIL,ZESTRIL Take 10 mg by mouth daily.  losartan 100 MG tablet Commonly known as:  COZAAR   metFORMIN 500 MG (MOD) 24 hr tablet Commonly known as:  GLUMETZA Take 500 mg by mouth 2 (two) times daily with a meal.   metroNIDAZOLE 500 MG tablet Commonly known as:  FLAGYL Take 1 tablet (500 mg total) by mouth 3 (three) times daily.   omeprazole 20 MG capsule Commonly known as:  PRILOSEC Take 20 mg by mouth 2 (two) times daily.   sitaGLIPtin 100 MG tablet Commonly known as:  JANUVIA Take 100 mg by mouth daily.   SPIRIVA HANDIHALER 18 MCG inhalation capsule Generic drug:  tiotropium USE 1 PUFF DAILY   triamterene-hydrochlorothiazide 37.5-25 MG tablet Commonly known as:  MAXZIDE-25 Take 1  tablet by mouth every morning.   valsartan 160 MG tablet Commonly known as:  DIOVAN Take 160 mg by mouth every morning.       Allergies:  Allergies  Allergen Reactions  . Amoxicillin-Pot Clavulanate Other (See Comments)  . Ceftriaxone Rash  . Morphine And Related Rash  . Quinine Other (See Comments)  . Rocephin [Ceftriaxone Sodium In Dextrose] Rash and Other (See Comments)  . Quinine Derivatives     Decreased platelet counts    Past Medical History, Surgical history, Social history, and Family History were reviewed and updated.  Review of Systems: All other 10 point review of systems is negative.   Physical Exam:  vitals were not taken for this visit.   Wt Readings from Last 3 Encounters:  10/06/17 200 lb (90.7 kg)  03/18/12 192 lb (87.1 kg)  03/02/12 188 lb 11.4 oz (85.6 kg)    Ocular: Sclerae unicteric, pupils equal, round and reactive to light Ear-nose-throat: Oropharynx clear, dentition fair Lymphatic: No cervical, supraclavicular or axillary adenopathy Lungs no rales or rhonchi, good excursion bilaterally Heart regular rate and rhythm, no murmur appreciated Abd soft, nontender, positive bowel sounds, no liver or spleen tip palpated on exam, no fluid wave  MSK no focal spinal tenderness, no joint edema Neuro: non-focal, well-oriented, appropriate affect Breasts: Deferred   Lab Results  Component Value Date   WBC 7.8 11/17/2017   HGB 10.3 (L) 11/17/2017   HCT 32.7 (L) 11/17/2017   MCV 93 11/17/2017   PLT 467 (H) 11/17/2017   Lab Results  Component Value Date   FERRITIN 10 10/06/2017   IRON 32 (L) 10/06/2017   TIBC 476 (H) 10/06/2017   UIBC 444 (H) 10/06/2017   IRONPCTSAT 7 (L) 10/06/2017   Lab Results  Component Value Date   RETICCTPCT 2.1 03/03/2012   RBC 3.53 (L) 11/17/2017   No results found for: KPAFRELGTCHN, LAMBDASER, KAPLAMBRATIO No results found for: IGGSERUM, IGA, IGMSERUM No results found for: Marda StalkerOTALPROTELP, ALBUMINELP, A1GS, A2GS, BETS,  BETA2SER, GAMS, MSPIKE, SPEI   Chemistry      Component Value Date/Time   NA 140 10/06/2017 1444   K 3.8 10/06/2017 1444   CL 99 10/06/2017 1444   CO2 29 10/06/2017 1444   BUN 16 10/06/2017 1444   CREATININE 0.7 10/06/2017 1444      Component Value Date/Time   CALCIUM 9.7 10/06/2017 1444   ALKPHOS 56 10/06/2017 1444   AST 21 10/06/2017 1444   ALT 20 10/06/2017 1444   BILITOT 0.40 10/06/2017 1444      Impression and Plan: Angelica [name] is a very pleasant 71 yo caucasian female with iron deficiency anemia secondary to malabsorption. She did well with IV iron and is feeling a little better.  Hgb is 10.3 with an  MCV of 93. Platelet count is still up a little at 467. We will see what her iron studies show and bring her back in later this week for an infusion if needed.  We will plan to see her back in another 2 months for follow-up.  She will contact our office with any questions or concerns.  We can certainly see him sooner if need be.   Verdie Mosher, NP 11/30/201811:29 AM

## 2017-11-18 LAB — RETICULOCYTES: Reticulocyte Count: 3.3 % — ABNORMAL HIGH (ref 0.6–2.6)

## 2017-11-23 ENCOUNTER — Ambulatory Visit (HOSPITAL_BASED_OUTPATIENT_CLINIC_OR_DEPARTMENT_OTHER): Payer: Medicare Other

## 2017-11-23 ENCOUNTER — Other Ambulatory Visit: Payer: Self-pay

## 2017-11-23 VITALS — BP 145/60 | HR 86 | Temp 98.1°F | Resp 16

## 2017-11-23 DIAGNOSIS — K909 Intestinal malabsorption, unspecified: Secondary | ICD-10-CM

## 2017-11-23 DIAGNOSIS — D508 Other iron deficiency anemias: Secondary | ICD-10-CM

## 2017-11-23 MED ORDER — FERUMOXYTOL INJECTION 510 MG/17 ML
510.0000 mg | Freq: Once | INTRAVENOUS | Status: AC
  Administered 2017-11-23: 510 mg via INTRAVENOUS
  Filled 2017-11-23: qty 17

## 2018-01-16 ENCOUNTER — Other Ambulatory Visit: Payer: Medicare Other

## 2018-01-16 ENCOUNTER — Ambulatory Visit: Payer: Medicare Other | Admitting: Family

## 2018-01-18 ENCOUNTER — Ambulatory Visit: Payer: Medicare Other | Admitting: Family

## 2018-01-18 ENCOUNTER — Other Ambulatory Visit: Payer: Medicare Other

## 2018-01-23 ENCOUNTER — Inpatient Hospital Stay: Payer: Medicare Other | Attending: Hematology & Oncology

## 2018-01-23 ENCOUNTER — Encounter: Payer: Self-pay | Admitting: Family

## 2018-01-23 ENCOUNTER — Other Ambulatory Visit: Payer: Self-pay

## 2018-01-23 ENCOUNTER — Inpatient Hospital Stay (HOSPITAL_BASED_OUTPATIENT_CLINIC_OR_DEPARTMENT_OTHER): Payer: Medicare Other | Admitting: Family

## 2018-01-23 VITALS — BP 152/59 | HR 98 | Temp 98.2°F | Resp 20 | Wt 206.0 lb

## 2018-01-23 DIAGNOSIS — D508 Other iron deficiency anemias: Secondary | ICD-10-CM

## 2018-01-23 DIAGNOSIS — D509 Iron deficiency anemia, unspecified: Secondary | ICD-10-CM | POA: Diagnosis not present

## 2018-01-23 DIAGNOSIS — K909 Intestinal malabsorption, unspecified: Secondary | ICD-10-CM | POA: Diagnosis not present

## 2018-01-23 LAB — CBC WITH DIFFERENTIAL (CANCER CENTER ONLY)
Basophils Absolute: 0 10*3/uL (ref 0.0–0.1)
Basophils Relative: 0 %
EOS PCT: 4 %
Eosinophils Absolute: 0.3 10*3/uL (ref 0.0–0.5)
HCT: 34.9 % (ref 34.8–46.6)
Hemoglobin: 11.3 g/dL — ABNORMAL LOW (ref 11.6–15.9)
LYMPHS ABS: 2.2 10*3/uL (ref 0.9–3.3)
Lymphocytes Relative: 33 %
MCH: 31.9 pg (ref 26.0–34.0)
MCHC: 32.4 g/dL (ref 32.0–36.0)
MCV: 98.6 fL (ref 81.0–101.0)
MONO ABS: 0.6 10*3/uL (ref 0.1–0.9)
Monocytes Relative: 9 %
Neutro Abs: 3.6 10*3/uL (ref 1.5–6.5)
Neutrophils Relative %: 54 %
PLATELETS: 463 10*3/uL — AB (ref 145–400)
RBC: 3.54 MIL/uL — AB (ref 3.70–5.32)
RDW: 13.8 % (ref 11.1–15.7)
WBC: 6.7 10*3/uL (ref 3.9–10.0)

## 2018-01-23 LAB — RETICULOCYTES
RBC.: 3.6 MIL/uL — AB (ref 3.70–5.45)
RETIC COUNT ABSOLUTE: 97.2 10*3/uL — AB (ref 33.7–90.7)
Retic Ct Pct: 2.7 % — ABNORMAL HIGH (ref 0.7–2.1)

## 2018-01-23 LAB — IRON AND TIBC
Iron: 62 ug/dL (ref 41–142)
SATURATION RATIOS: 17 % — AB (ref 21–57)
TIBC: 364 ug/dL (ref 236–444)
UIBC: 302 ug/dL

## 2018-01-23 LAB — FERRITIN: FERRITIN: 66 ng/mL (ref 9–269)

## 2018-01-23 NOTE — Progress Notes (Signed)
Hematology and Oncology Follow Up Visit  Angelica [name] [id number] 01/17/1946 72 y.o. 01/23/2018   Principle Diagnosis:  Iron deficiency anemia  Current Therapy:   IV iron as indicated - last received in December 2018    Interim History:  Angelica [name] is here today for follow-up. She is feeling fatigued. She has SOB and occasional chest discomfort which she attributes to COPD.  She has had no fever, chills, n/v, cough, rash, dizziness, palpitations, abdominal pain or changes in bowel or bladder habits.  No bleeding, bruising or petechiae. No lymphadenopathy found on exam.  She has chronic swelling in her lower extremities, worse in the right leg. She takes lasix as indicated for this.  The numbness and tingling in her right hand is unchanged.  She has maintained a good appetite and is staying well hydrated. Her weight is stable.   ECOG Performance Status: 1 - Symptomatic but completely ambulatory  Medications:  Allergies as of 01/23/2018      Reactions   Amoxicillin-pot Clavulanate Other (See Comments)   Ceftriaxone Rash   Morphine And Related Rash   Quinine Other (See Comments)   Rocephin [ceftriaxone Sodium In Dextrose] Rash, Other (See Comments)   Quinine Derivatives    Decreased platelet counts      Medication List        Accurate as of 01/23/18  9:50 AM. Always use your most recent med list.          ADVAIR DISKUS 250-50 MCG/DOSE Aepb Generic drug:  Fluticasone-Salmeterol Inhale 1 puff into the lungs every 12 (twelve) hours.   albuterol (2.5 MG/3ML) 0.083% nebulizer solution Commonly known as:  PROVENTIL   cyclobenzaprine 10 MG tablet Commonly known as:  FLEXERIL Take 10 mg by mouth 3 (three) times daily as needed. For muscle spasms   diltiazem 120 MG 24 hr capsule Commonly known as:  TIAZAC Take 120 mg by mouth at bedtime.   diphenhydramine-acetaminophen 25-500 MG Tabs tablet Commonly known as:  TYLENOL PM Take 1 tablet by mouth at bedtime as needed. For  sleep   docusate sodium 100 MG capsule Commonly known as:  COLACE Take 100 mg by mouth at bedtime.   DULoxetine 60 MG capsule Commonly known as:  CYMBALTA Take 60 mg by mouth daily.   ergocalciferol 50000 units capsule Commonly known as:  VITAMIN D2 Take 50,000 Units by mouth once a week. On Saturdays   estradiol 1 MG tablet Commonly known as:  ESTRACE Take 1 mg by mouth at bedtime.   fenofibrate 160 MG tablet Take 160 mg by mouth at bedtime.   furosemide 20 MG tablet Commonly known as:  LASIX TAKE ONE (1) TABLET EACH DAY   glipiZIDE 5 MG 24 hr tablet Commonly known as:  GLUCOTROL XL Take 5 mg by mouth daily.   ibuprofen 200 MG tablet Commonly known as:  ADVIL,MOTRIN Take 400 mg by mouth every 6 (six) hours as needed. For pain   losartan 100 MG tablet Commonly known as:  COZAAR   metFORMIN 500 MG (MOD) 24 hr tablet Commonly known as:  GLUMETZA Take 500 mg by mouth 2 (two) times daily with a meal.   omeprazole 20 MG capsule Commonly known as:  PRILOSEC Take 20 mg by mouth 2 (two) times daily.   sitaGLIPtin 100 MG tablet Commonly known as:  JANUVIA Take 100 mg by mouth daily.   SPIRIVA HANDIHALER 18 MCG inhalation capsule Generic drug:  tiotropium USE 1 PUFF DAILY   triamterene-hydrochlorothiazide 37.5-25 MG tablet  Commonly known as:  MAXZIDE-25 Take 1 tablet by mouth every morning.       Allergies:  Allergies  Allergen Reactions  . Amoxicillin-Pot Clavulanate Other (See Comments)  . Ceftriaxone Rash  . Morphine And Related Rash  . Quinine Other (See Comments)  . Rocephin [Ceftriaxone Sodium In Dextrose] Rash and Other (See Comments)  . Quinine Derivatives     Decreased platelet counts    Past Medical History, Surgical history, Social history, and Family History were reviewed and updated.  Review of Systems: All other 10 point review of systems is negative.   Physical Exam:  vitals were not taken for this visit.   Wt Readings from Last 3  Encounters:  11/17/17 201 lb (91.2 kg)  10/06/17 200 lb (90.7 kg)  03/18/12 192 lb (87.1 kg)    Ocular: Sclerae unicteric, pupils equal, round and reactive to light Ear-nose-throat: Oropharynx clear, dentition fair Lymphatic: No cervical, supraclavicular or axillary adenopathy Lungs no rales or rhonchi, good excursion bilaterally Heart regular rate and rhythm, no murmur appreciated Abd soft, nontender, positive bowel sounds, no liver or spleen tip palpated on exam, no fluid wave  MSK no focal spinal tenderness, no joint edema Neuro: non-focal, well-oriented, appropriate affect Breasts: Deferred   Lab Results  Component Value Date   WBC 6.7 01/23/2018   HGB 10.3 (L) 11/17/2017   HCT 34.9 01/23/2018   MCV 98.6 01/23/2018   PLT 463 (H) 01/23/2018   Lab Results  Component Value Date   FERRITIN 199 11/17/2017   IRON 46 11/17/2017   TIBC 346 11/17/2017   UIBC 301 11/17/2017   IRONPCTSAT 13 (L) 11/17/2017   Lab Results  Component Value Date   RETICCTPCT 2.1 03/03/2012   RBC 3.54 (L) 01/23/2018   No results found for: KPAFRELGTCHN, LAMBDASER, KAPLAMBRATIO No results found for: IGGSERUM, IGA, IGMSERUM No results found for: Marda StalkerOTALPROTELP, ALBUMINELP, A1GS, A2GS, BETS, BETA2SER, GAMS, MSPIKE, SPEI   Chemistry      Component Value Date/Time   NA 140 10/06/2017 1444   K 3.8 10/06/2017 1444   CL 99 10/06/2017 1444   CO2 29 10/06/2017 1444   BUN 16 10/06/2017 1444   CREATININE 0.7 10/06/2017 1444      Component Value Date/Time   CALCIUM 9.7 10/06/2017 1444   ALKPHOS 56 10/06/2017 1444   AST 21 10/06/2017 1444   ALT 20 10/06/2017 1444   BILITOT 0.40 10/06/2017 1444      Impression and Plan: [name] is a very pleasant 72 yo caucasian female with iron deficiency anemia secondary to malabsorption. She is symptomatic with fatigue at this time.  We will see what her iron studies show and bring her back in for infusion if needed.  We will go ahead and plan to see her back in  another 2 months for follow-up.  She will contact our office with any questions or concerns. We can certainly see her sooner if need be.   Emeline GinsSarah [place], NP 2/5/20199:50 AM

## 2018-01-31 ENCOUNTER — Inpatient Hospital Stay: Payer: Medicare Other

## 2018-01-31 VITALS — BP 169/65 | HR 88 | Temp 98.1°F | Resp 20

## 2018-01-31 DIAGNOSIS — D509 Iron deficiency anemia, unspecified: Secondary | ICD-10-CM | POA: Diagnosis not present

## 2018-01-31 DIAGNOSIS — D508 Other iron deficiency anemias: Secondary | ICD-10-CM

## 2018-01-31 MED ORDER — SODIUM CHLORIDE 0.9 % IV SOLN
510.0000 mg | Freq: Once | INTRAVENOUS | Status: AC
  Administered 2018-01-31: 510 mg via INTRAVENOUS
  Filled 2018-01-31: qty 17

## 2018-01-31 MED ORDER — SODIUM CHLORIDE 0.9 % IV SOLN
INTRAVENOUS | Status: DC
  Administered 2018-01-31: 11:00:00 via INTRAVENOUS

## 2018-01-31 NOTE — Patient Instructions (Signed)

## 2018-03-13 ENCOUNTER — Inpatient Hospital Stay: Payer: Medicare Other | Attending: Hematology & Oncology | Admitting: Family

## 2018-03-13 ENCOUNTER — Other Ambulatory Visit: Payer: Self-pay

## 2018-03-13 ENCOUNTER — Encounter: Payer: Self-pay | Admitting: Family

## 2018-03-13 ENCOUNTER — Inpatient Hospital Stay: Payer: Medicare Other

## 2018-03-13 VITALS — BP 152/50 | HR 93 | Temp 97.8°F | Resp 18 | Wt 208.2 lb

## 2018-03-13 DIAGNOSIS — D509 Iron deficiency anemia, unspecified: Secondary | ICD-10-CM | POA: Insufficient documentation

## 2018-03-13 DIAGNOSIS — D508 Other iron deficiency anemias: Secondary | ICD-10-CM | POA: Diagnosis not present

## 2018-03-13 DIAGNOSIS — J449 Chronic obstructive pulmonary disease, unspecified: Secondary | ICD-10-CM | POA: Diagnosis not present

## 2018-03-13 DIAGNOSIS — K9049 Malabsorption due to intolerance, not elsewhere classified: Secondary | ICD-10-CM | POA: Insufficient documentation

## 2018-03-13 LAB — CBC WITH DIFFERENTIAL (CANCER CENTER ONLY)
Basophils Absolute: 0 10*3/uL (ref 0.0–0.1)
Basophils Relative: 1 %
EOS ABS: 0.3 10*3/uL (ref 0.0–0.5)
Eosinophils Relative: 4 %
HEMATOCRIT: 33.2 % — AB (ref 34.8–46.6)
HEMOGLOBIN: 10.8 g/dL — AB (ref 11.6–15.9)
LYMPHS ABS: 2 10*3/uL (ref 0.9–3.3)
LYMPHS PCT: 30 %
MCH: 32.3 pg (ref 26.0–34.0)
MCHC: 32.5 g/dL (ref 32.0–36.0)
MCV: 99.4 fL (ref 81.0–101.0)
Monocytes Absolute: 0.6 10*3/uL (ref 0.1–0.9)
Monocytes Relative: 9 %
NEUTROS ABS: 3.8 10*3/uL (ref 1.5–6.5)
NEUTROS PCT: 56 %
Platelet Count: 454 10*3/uL — ABNORMAL HIGH (ref 145–400)
RBC: 3.34 MIL/uL — AB (ref 3.70–5.32)
RDW: 12.4 % (ref 11.1–15.7)
WBC Count: 6.6 10*3/uL (ref 3.9–10.0)

## 2018-03-13 LAB — IRON AND TIBC
Iron: 71 ug/dL (ref 41–142)
SATURATION RATIOS: 20 % — AB (ref 21–57)
TIBC: 354 ug/dL (ref 236–444)
UIBC: 283 ug/dL

## 2018-03-13 LAB — FERRITIN: Ferritin: 71 ng/mL (ref 9–269)

## 2018-03-13 LAB — RETICULOCYTES
RBC.: 3.37 MIL/uL — AB (ref 3.70–5.45)
RETIC COUNT ABSOLUTE: 91 10*3/uL — AB (ref 33.7–90.7)
RETIC CT PCT: 2.7 % — AB (ref 0.7–2.1)

## 2018-03-13 NOTE — Progress Notes (Addendum)
Hematology and Oncology Follow Up Visit  Angelica HeritageVickie S [name] [id number] 04/09/1946 72 y.o. 03/13/2018   Principle Diagnosis:  Iron deficiency anemia  Current Therapy:   IV iron as indicated- last received in February 2019   Interim History:  Angelica [name] is here today for follow-up. She is symptomatic with some fatigue.  She has some SOB with exertion due to COPD. She has been dealing with sinus congestion and drainage with the change in weather.  She has had no fever, chills, n/v, cough, rash, dizziness, chest pain, palpitations, abdominal pain or changes in bowel or bladder habits.  No episodes of bleeding, no bruising or petechiae. No lymphadenopathy found on exam.  She has occasional puffiness in her hands and feet and will take lasix as needed. The numbness and tingling in her fingertips is unchanged. She has maintained a good appetite and is staying well hydrated. Her weight is stable.   ECOG Performance Status: 1 - Symptomatic but completely ambulatory  Medications:  Allergies as of 03/13/2018      Reactions   Amoxicillin-pot Clavulanate Other (See Comments)   Ceftriaxone Rash   Morphine And Related Rash   Quinine Other (See Comments)   Rocephin [ceftriaxone Sodium In Dextrose] Rash, Other (See Comments)   Quinine Derivatives    Decreased platelet counts      Medication List        Accurate as of 03/13/18  9:55 AM. Always use your most recent med list.          ADVAIR DISKUS 250-50 MCG/DOSE Aepb Generic drug:  Fluticasone-Salmeterol Inhale 1 puff into the lungs every 12 (twelve) hours.   albuterol (2.5 MG/3ML) 0.083% nebulizer solution Commonly known as:  PROVENTIL   cyclobenzaprine 10 MG tablet Commonly known as:  FLEXERIL Take 10 mg by mouth 3 (three) times daily as needed. For muscle spasms   diltiazem 120 MG 24 hr capsule Commonly known as:  TIAZAC Take 120 mg by mouth at bedtime.   diltiazem 120 MG 24 hr capsule Commonly known as:  CARDIZEM CD Take 120  mg by mouth daily.   diphenhydramine-acetaminophen 25-500 MG Tabs tablet Commonly known as:  TYLENOL PM Take 1 tablet by mouth at bedtime as needed. For sleep   docusate sodium 100 MG capsule Commonly known as:  COLACE Take 100 mg by mouth at bedtime.   DULoxetine 60 MG capsule Commonly known as:  CYMBALTA Take 60 mg by mouth daily.   ergocalciferol 50000 units capsule Commonly known as:  VITAMIN D2 Take 50,000 Units by mouth once a week. On Saturdays   estradiol 1 MG tablet Commonly known as:  ESTRACE Take 1 mg by mouth at bedtime.   fenofibrate 160 MG tablet Take 160 mg by mouth at bedtime.   furosemide 20 MG tablet Commonly known as:  LASIX TAKE ONE (1) TABLET EACH DAY   glipiZIDE 10 MG 24 hr tablet Commonly known as:  GLUCOTROL XL Take 10 mg by mouth daily.   ibuprofen 200 MG tablet Commonly known as:  ADVIL,MOTRIN Take 400 mg by mouth every 6 (six) hours as needed. For pain   losartan 100 MG tablet Commonly known as:  COZAAR   metFORMIN 500 MG (MOD) 24 hr tablet Commonly known as:  GLUMETZA Take 500 mg by mouth 2 (two) times daily with a meal.   omeprazole 20 MG capsule Commonly known as:  PRILOSEC Take 20 mg by mouth 2 (two) times daily.   sitaGLIPtin 100 MG tablet Commonly known as:  JANUVIA Take 100 mg by mouth daily.   SPIRIVA HANDIHALER 18 MCG inhalation capsule Generic drug:  tiotropium USE 1 PUFF DAILY   triamterene-hydrochlorothiazide 37.5-25 MG tablet Commonly known as:  MAXZIDE-25 Take 1 tablet by mouth every morning.       Allergies:  Allergies  Allergen Reactions  . Amoxicillin-Pot Clavulanate Other (See Comments)  . Ceftriaxone Rash  . Morphine And Related Rash  . Quinine Other (See Comments)  . Rocephin [Ceftriaxone Sodium In Dextrose] Rash and Other (See Comments)  . Quinine Derivatives     Decreased platelet counts    Past Medical History, Surgical history, Social history, and Family History were reviewed and  updated.  Review of Systems: All other 10 point review of systems is negative.   Physical Exam:  weight is 208 lb 4 oz (94.5 kg). Her oral temperature is 97.8 F (36.6 C). Her blood pressure is 152/50 (abnormal) and her pulse is 93. Her respiration is 18 and oxygen saturation is 94%.   Wt Readings from Last 3 Encounters:  03/13/18 208 lb 4 oz (94.5 kg)  01/23/18 206 lb (93.4 kg)  11/17/17 201 lb (91.2 kg)    Ocular: Sclerae unicteric, pupils equal, round and reactive to light Ear-nose-throat: Oropharynx clear, dentition fair Lymphatic: No cervical, supraclavicular or axillary adenopathy Lungs no rales or rhonchi, good excursion bilaterally Heart regular rate and rhythm, no murmur appreciated Abd soft, nontender, positive bowel sounds, no liver or spleen tip palpated on exam, no fluid wave  MSK no focal spinal tenderness, no joint edema Neuro: non-focal, well-oriented, appropriate affect Breasts: Deferred   Lab Results  Component Value Date   WBC 6.6 03/13/2018   HGB 10.3 (L) 11/17/2017   HCT 33.2 (L) 03/13/2018   MCV 99.4 03/13/2018   PLT 454 (H) 03/13/2018   Lab Results  Component Value Date   FERRITIN 66 01/23/2018   IRON 62 01/23/2018   TIBC 364 01/23/2018   UIBC 302 01/23/2018   IRONPCTSAT 17 (L) 01/23/2018   Lab Results  Component Value Date   RETICCTPCT 2.7 (H) 01/23/2018   RBC 3.34 (L) 03/13/2018   No results found for: KPAFRELGTCHN, LAMBDASER, KAPLAMBRATIO No results found for: IGGSERUM, IGA, IGMSERUM No results found for: Marda Stalker, SPEI   Chemistry      Component Value Date/Time   NA 140 10/06/2017 1444   K 3.8 10/06/2017 1444   CL 99 10/06/2017 1444   CO2 29 10/06/2017 1444   BUN 16 10/06/2017 1444   CREATININE 0.7 10/06/2017 1444      Component Value Date/Time   CALCIUM 9.7 10/06/2017 1444   ALKPHOS 56 10/06/2017 1444   AST 21 10/06/2017 1444   ALT 20 10/06/2017 1444   BILITOT 0.40  10/06/2017 1444      Impression and Plan: Angelica [name] is a very pleasant 72 yo caucasian female with iron deficiency anemia secondary to malabsorption. She is having some fatigue.  We will see what her iron studies show and bring her back in for infusion if needed.  We will go ahead and plan to see her back in another 2 months. She is considering changing to a hematologist closer to home [facility]). I told her if she found one to let us know and we can send them her records.  She will contact our office with any questions or concerns. We can certainly see her sooner if need be.   Emeline Gins, NP 3/26/20199:55 AM

## 2018-03-27 ENCOUNTER — Inpatient Hospital Stay: Payer: Medicare Other

## 2018-04-06 ENCOUNTER — Inpatient Hospital Stay: Payer: Medicare Other | Attending: Hematology & Oncology

## 2018-04-06 VITALS — BP 158/72 | HR 93 | Temp 98.2°F | Resp 20

## 2018-04-06 DIAGNOSIS — D509 Iron deficiency anemia, unspecified: Secondary | ICD-10-CM | POA: Diagnosis present

## 2018-04-06 DIAGNOSIS — D508 Other iron deficiency anemias: Secondary | ICD-10-CM

## 2018-04-06 MED ORDER — SODIUM CHLORIDE 0.9 % IV SOLN
510.0000 mg | Freq: Once | INTRAVENOUS | Status: AC
  Administered 2018-04-06: 510 mg via INTRAVENOUS
  Filled 2018-04-06: qty 17

## 2018-04-06 MED ORDER — SODIUM CHLORIDE 0.9 % IV SOLN
INTRAVENOUS | Status: DC
  Administered 2018-04-06: 10:00:00 via INTRAVENOUS

## 2018-04-06 NOTE — Patient Instructions (Signed)

## 2018-05-15 ENCOUNTER — Inpatient Hospital Stay (HOSPITAL_BASED_OUTPATIENT_CLINIC_OR_DEPARTMENT_OTHER): Payer: Medicare Other | Admitting: Family

## 2018-05-15 ENCOUNTER — Inpatient Hospital Stay: Payer: Medicare Other | Attending: Hematology & Oncology

## 2018-05-15 VITALS — BP 157/63 | HR 92 | Temp 98.0°F | Resp 18 | Wt 204.2 lb

## 2018-05-15 DIAGNOSIS — D508 Other iron deficiency anemias: Secondary | ICD-10-CM | POA: Diagnosis not present

## 2018-05-15 DIAGNOSIS — D509 Iron deficiency anemia, unspecified: Secondary | ICD-10-CM | POA: Diagnosis present

## 2018-05-15 DIAGNOSIS — K909 Intestinal malabsorption, unspecified: Secondary | ICD-10-CM | POA: Diagnosis not present

## 2018-05-15 LAB — CBC WITH DIFFERENTIAL (CANCER CENTER ONLY)
Basophils Absolute: 0.1 10*3/uL (ref 0.0–0.1)
Basophils Relative: 1 %
EOS ABS: 0.3 10*3/uL (ref 0.0–0.5)
Eosinophils Relative: 4 %
HCT: 32 % — ABNORMAL LOW (ref 34.8–46.6)
HEMOGLOBIN: 10.3 g/dL — AB (ref 11.6–15.9)
LYMPHS ABS: 1.4 10*3/uL (ref 0.9–3.3)
Lymphocytes Relative: 20 %
MCH: 32 pg (ref 26.0–34.0)
MCHC: 32.2 g/dL (ref 32.0–36.0)
MCV: 99.4 fL (ref 81.0–101.0)
MONOS PCT: 8 %
Monocytes Absolute: 0.6 10*3/uL (ref 0.1–0.9)
NEUTROS PCT: 67 %
Neutro Abs: 4.7 10*3/uL (ref 1.5–6.5)
PLATELETS: 513 10*3/uL — AB (ref 145–400)
RBC: 3.22 MIL/uL — ABNORMAL LOW (ref 3.70–5.32)
RDW: 12.5 % (ref 11.1–15.7)
WBC Count: 7 10*3/uL (ref 3.9–10.0)

## 2018-05-15 LAB — RETICULOCYTES
RBC.: 3.29 MIL/uL — AB (ref 3.70–5.45)
RETIC COUNT ABSOLUTE: 118.4 10*3/uL — AB (ref 33.7–90.7)
Retic Ct Pct: 3.6 % — ABNORMAL HIGH (ref 0.7–2.1)

## 2018-05-15 NOTE — Progress Notes (Signed)
Hematology and Oncology Follow Up Visit  Angelica [name] [id number] 11-Jun-1946 72 y.o. 05/15/2018   Principle Diagnosis:  Iron deficiency anemia  Current Therapy:   IV iron as indicated- last received in February 2019   Interim History: Angelica [name] is here today for follow-up. She is symptomatic with fatigue.  She has neck and lower back pain she states due to bulging discs and a cyst on a ligament. She has an appointment with Dr. Sharion Dove with neurosurgery tomorrow.  She has chronic numbness and tingling in her left lower extremity due to her back issues. No other numbness or tingling noted in her extremities.  The swelling in her lower extremities is unchanged.  She has SOB with activity secondary to COPD. This is unchanged.  No fever, chills, n/v, cough, rash, dizziness, chest pain, palpitations, abdominal pain or changes in bowel or bladder habits.  No episodes of bleeding, no bruising or petechiae.  She has maintained a good appetite and is staying well hydrated. Her weight is stable.    ECOG Performance Status: 1 - Symptomatic but completely ambulatory  Medications:  Allergies as of 05/15/2018      Reactions   Amoxicillin-pot Clavulanate Other (See Comments)   Ceftriaxone Rash   Morphine And Related Rash   Quinine Other (See Comments)   Rocephin [ceftriaxone Sodium In Dextrose] Rash, Other (See Comments)   Quinine Derivatives    Decreased platelet counts      Medication List        Accurate as of 05/15/18  1:36 PM. Always use your most recent med list.          ADVAIR DISKUS 250-50 MCG/DOSE Aepb Generic drug:  Fluticasone-Salmeterol Inhale 1 puff into the lungs every 12 (twelve) hours.   albuterol (2.5 MG/3ML) 0.083% nebulizer solution Commonly known as:  PROVENTIL   cyclobenzaprine 10 MG tablet Commonly known as:  FLEXERIL Take 10 mg by mouth 3 (three) times daily as needed. For muscle spasms   diltiazem 120 MG 24 hr capsule Commonly known as:   TIAZAC Take 120 mg by mouth at bedtime.   diltiazem 120 MG 24 hr capsule Commonly known as:  CARDIZEM CD Take 120 mg by mouth daily.   diphenhydramine-acetaminophen 25-500 MG Tabs tablet Commonly known as:  TYLENOL PM Take 1 tablet by mouth at bedtime as needed. For sleep   docusate sodium 100 MG capsule Commonly known as:  COLACE Take 100 mg by mouth at bedtime.   DULoxetine 60 MG capsule Commonly known as:  CYMBALTA Take 60 mg by mouth daily.   ergocalciferol 50000 units capsule Commonly known as:  VITAMIN D2 Take 50,000 Units by mouth once a week. On Saturdays   estradiol 1 MG tablet Commonly known as:  ESTRACE Take 1 mg by mouth at bedtime.   fenofibrate 160 MG tablet Take 160 mg by mouth at bedtime.   furosemide 20 MG tablet Commonly known as:  LASIX TAKE ONE (1) TABLET EACH DAY   glipiZIDE 10 MG 24 hr tablet Commonly known as:  GLUCOTROL XL Take 10 mg by mouth daily.   ibuprofen 200 MG tablet Commonly known as:  ADVIL,MOTRIN Take 400 mg by mouth every 6 (six) hours as needed. For pain   losartan 100 MG tablet Commonly known as:  COZAAR   metFORMIN 500 MG (MOD) 24 hr tablet Commonly known as:  GLUMETZA Take 500 mg by mouth 2 (two) times daily with a meal.   omeprazole 20 MG capsule Commonly known as:  PRILOSEC Take 20 mg by mouth 2 (two) times daily.   sitaGLIPtin 100 MG tablet Commonly known as:  JANUVIA Take 100 mg by mouth daily.   SPIRIVA HANDIHALER 18 MCG inhalation capsule Generic drug:  tiotropium USE 1 PUFF DAILY   triamterene-hydrochlorothiazide 37.5-25 MG tablet Commonly known as:  MAXZIDE-25 Take 1 tablet by mouth every morning.       Allergies:  Allergies  Allergen Reactions  . Amoxicillin-Pot Clavulanate Other (See Comments)  . Ceftriaxone Rash  . Morphine And Related Rash  . Quinine Other (See Comments)  . Rocephin [Ceftriaxone Sodium In Dextrose] Rash and Other (See Comments)  . Quinine Derivatives     Decreased platelet  counts    Past Medical History, Surgical history, Social history, and Family History were reviewed and updated.  Review of Systems: All other 10 point review of systems is negative.   Physical Exam:  weight is 204 lb 4 oz (92.6 kg). Her oral temperature is 98 F (36.7 C). Her blood pressure is 157/63 (abnormal) and her pulse is 92. Her respiration is 18 and oxygen saturation is 93%.   Wt Readings from Last 3 Encounters:  05/15/18 204 lb 4 oz (92.6 kg)  03/13/18 208 lb 4 oz (94.5 kg)  01/23/18 206 lb (93.4 kg)    Ocular: Sclerae unicteric, pupils equal, round and reactive to light Ear-nose-throat: Oropharynx clear, dentition fair Lymphatic: No cervical, supraclavicular or axillary adenopathy Lungs no rales or rhonchi, good excursion bilaterally Heart regular rate and rhythm, no murmur appreciated Abd soft, nontender, positive bowel sounds, no liver or spleen tip palpated on exam, no fluid wave  MSK no focal spinal tenderness, no joint edema Neuro: non-focal, well-oriented, appropriate affect Breasts: Deferred   Lab Results  Component Value Date   WBC 7.0 05/15/2018   HGB 10.3 (L) 05/15/2018   HCT 32.0 (L) 05/15/2018   MCV 99.4 05/15/2018   PLT 513 (H) 05/15/2018   Lab Results  Component Value Date   FERRITIN 71 03/13/2018   IRON 71 03/13/2018   TIBC 354 03/13/2018   UIBC 283 03/13/2018   IRONPCTSAT 20 (L) 03/13/2018   Lab Results  Component Value Date   RETICCTPCT 2.7 (H) 03/13/2018   RBC 3.22 (L) 05/15/2018   No results found for: KPAFRELGTCHN, LAMBDASER, KAPLAMBRATIO No results found for: IGGSERUM, IGA, IGMSERUM No results found for: Georgann Housekeeper, MSPIKE, SPEI   Chemistry      Component Value Date/Time   NA 140 10/06/2017 1444   K 3.8 10/06/2017 1444   CL 99 10/06/2017 1444   CO2 29 10/06/2017 1444   BUN 16 10/06/2017 1444   CREATININE 0.7 10/06/2017 1444      Component Value Date/Time   CALCIUM 9.7  10/06/2017 1444   ALKPHOS 56 10/06/2017 1444   AST 21 10/06/2017 1444   ALT 20 10/06/2017 1444   BILITOT 0.40 10/06/2017 1444      Impression and Plan: Angelica [name] is a very pleasant 72 yo caucasian female with iron deficiency anemia secondary to malabsorption. She is symptomatic with fatigue.  We will see what her iron studies show and bring her back in next week for infusion if needed.  We will go ahead and plan to see her back in another 2 months for follow-up.  She will contact our office with any questions or concerns. We can certainly see her sooner if need be.   Emeline Gins, NP 5/28/20191:36 PM

## 2018-05-16 ENCOUNTER — Other Ambulatory Visit: Payer: Medicare Other

## 2018-05-16 ENCOUNTER — Ambulatory Visit: Payer: Medicare Other | Admitting: Family

## 2018-05-16 LAB — IRON AND TIBC
Iron: 43 ug/dL (ref 41–142)
Saturation Ratios: 13 % — ABNORMAL LOW (ref 21–57)
TIBC: 320 ug/dL (ref 236–444)
UIBC: 277 ug/dL

## 2018-05-16 LAB — FERRITIN: FERRITIN: 96 ng/mL (ref 9–269)

## 2018-05-22 ENCOUNTER — Inpatient Hospital Stay: Payer: Medicare Other | Attending: Hematology & Oncology

## 2018-05-22 VITALS — BP 160/68 | HR 104 | Temp 97.9°F | Resp 20

## 2018-05-22 DIAGNOSIS — K909 Intestinal malabsorption, unspecified: Secondary | ICD-10-CM | POA: Insufficient documentation

## 2018-05-22 DIAGNOSIS — D508 Other iron deficiency anemias: Secondary | ICD-10-CM | POA: Diagnosis present

## 2018-05-22 MED ORDER — FERUMOXYTOL INJECTION 510 MG/17 ML
510.0000 mg | Freq: Once | INTRAVENOUS | Status: AC
  Administered 2018-05-22: 510 mg via INTRAVENOUS
  Filled 2018-05-22: qty 17

## 2018-05-22 MED ORDER — SODIUM CHLORIDE 0.9 % IV SOLN
INTRAVENOUS | Status: DC
  Administered 2018-05-22: 15:00:00 via INTRAVENOUS

## 2018-05-22 NOTE — Patient Instructions (Signed)

## 2018-05-23 ENCOUNTER — Ambulatory Visit: Payer: Medicare Other

## 2018-05-25 ENCOUNTER — Other Ambulatory Visit: Payer: Self-pay | Admitting: Neurosurgery

## 2018-05-25 DIAGNOSIS — G8929 Other chronic pain: Secondary | ICD-10-CM

## 2018-05-25 DIAGNOSIS — M5442 Lumbago with sciatica, left side: Principal | ICD-10-CM

## 2018-05-31 ENCOUNTER — Ambulatory Visit: Payer: Medicare Other | Attending: Neurosurgery | Admitting: Physical Therapy

## 2018-05-31 ENCOUNTER — Encounter: Payer: Self-pay | Admitting: Physical Therapy

## 2018-05-31 DIAGNOSIS — M5442 Lumbago with sciatica, left side: Secondary | ICD-10-CM | POA: Diagnosis not present

## 2018-05-31 DIAGNOSIS — R262 Difficulty in walking, not elsewhere classified: Secondary | ICD-10-CM | POA: Diagnosis present

## 2018-05-31 DIAGNOSIS — R293 Abnormal posture: Secondary | ICD-10-CM | POA: Diagnosis present

## 2018-05-31 DIAGNOSIS — G8929 Other chronic pain: Secondary | ICD-10-CM

## 2018-05-31 DIAGNOSIS — M6281 Muscle weakness (generalized): Secondary | ICD-10-CM | POA: Diagnosis present

## 2018-05-31 NOTE — Therapy (Signed)
[facility] [address] [place], [place], [address] Phone: [phone]   Fax:  [phone]  Physical Therapy Evaluation  Patient Details  Name: Angelica [name] MRN: [id number] Date of Birth: 09/20/1946 Referring Provider: Lisbeth RenshawNeelesh Nundkumar, MD   Encounter Date: 05/31/2018  PT End of Session - 05/31/18 1015    Visit Number  1    Number of Visits  13    Authorization Type  [employer], medicare    PT Start Time  0950    PT Stop Time  1035    PT Time Calculation (min)  45 min    Activity Tolerance  Patient tolerated treatment well    Behavior During Therapy  [facility] for tasks assessed/performed       Past Medical History:  Diagnosis Date  . Arthritis   . Asthma   . COPD (chronic obstructive pulmonary disease) (HCC)   . Diabetes mellitus   . Hypertension     Past Surgical History:  Procedure Laterality Date  . ABDOMINAL HYSTERECTOMY    . BACK SURGERY    . CESAREAN SECTION    . CHOLECYSTECTOMY    . FOOT SURGERY    . TONSILLECTOMY    . TOTAL KNEE ARTHROPLASTY    . TUBAL LIGATION      There were no vitals filed for this visit.   Subjective Assessment - 05/31/18 1008    Subjective  Pt arriving to therapy for evaluation of low back pain with left sided sciatica. Pt reports previous 3 back surgeries and pain has been existing for years. Pt reporting her sciatica began during the last year. 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     Pertinent History  3 previous back surgeries    Limitations  Walking    How long can you sit comfortably?  about 30 minutes, but I have to reposition often off my left hip    How long can you walk comfortably?  5 minutes    Currently in Pain?  Yes    Pain Score  5     Pain Location  Back    Pain Orientation  Lower;Left    Pain Descriptors / Indicators  Aching;Nagging it's worse in the am, 8/10    Pain Type  Chronic pain    Pain  Radiating Towards  down lateral left leg    Pain Onset  More than a month ago    Pain Frequency  Constant    Aggravating Factors   sitting long periords, standing long periods    Pain Relieving Factors  over the counter meds    Effect of Pain on Daily Activities  diffictulty cooking a full meal, and with household chores, pt is fearful of gettting in/out of tub, pt reporting difficulty with getting pants and socks on         [facility] - 05/31/18 0001      Assessment   Medical Diagnosis  low back pain with left sciatica    Referring Provider  Lisbeth RenshawNeelesh Nundkumar, MD    Onset Date/Surgical Date  -- worsening over the last year    Hand Dominance  Right    Next MD Visit  tomorrow for possible spinal injection    Prior Therapy  no      Precautions   Precautions  None      Restrictions   Weight Bearing Restrictions  No  Balance Screen   Has the patient fallen in the past 6 months  No    Is the patient reluctant to leave their home because of a fear of falling?   No      Home Environment   Living Environment  Private residence    Additional Comments  Pt's laundry is in her basement and needs to go up and down 1 flight of stairs      Prior Function   Level of Independence  Independent with basic ADLs    Leisure  taking care of my grandbabies      Cognition   Overall Cognitive Status  Within Functional Limits for tasks assessed      Observation/Other Assessments   Focus on Therapeutic Outcomes (FOTO)   61% limitation goal is 52% or better limitation      Posture/Postural Control   Posture/Postural Control  Postural limitations    Postural Limitations  Rounded Shoulders;Forward head;Increased lumbar lordosis;Anterior pelvic tilt      ROM / Strength   AROM / PROM / Strength  AROM;Strength      AROM   AROM Assessment Site  Lumbar    Lumbar Flexion  65    Lumbar Extension  22    Lumbar - Right Side Bend  10    Lumbar - Left Side Bend  14    Lumbar - Right  Rotation  limited    Lumbar - Left Rotation  limited      Strength   Overall Strength Comments  bilateral knees grossly 5/5    Strength Assessment Site  Hip    Right/Left Hip  Right;Left    Right Hip Flexion  4-/5    Right Hip Extension  4-/5    Right Hip ABduction  4-/5    Right Hip ADduction  4-/5    Left Hip Flexion  3+/5    Left Hip Extension  3+/5    Left Hip ABduction  3+/5    Left Hip ADduction  3+/5      Palpation   Palpation comment  tenderness over left lumbar paraspinals, trigger points noted, tight left QL, mild tenderness over left piriformis      Transfers   Five time sit to stand comments   23 seconds with UE support      Ambulation/Gait   Gait Pattern  Step-through pattern;Decreased step length - left      Balance   Balance Assessed  Yes      High Level Balance   High Level Balance Comments  TUG: 19. 2 seconds                 Objective measurements completed on examination: See above findings.              PT Education - 05/31/18 1014    Education Details  hep    Person(s) Educated  Patient    Methods  Explanation;Demonstration;Handout;Tactile cues    Comprehension  Verbalized understanding;Returned demonstration       PT Short Term Goals - 05/31/18 1016      PT SHORT TERM GOAL #1   Title  Pt will be independent in her HEP.     Time  3    Period  Weeks    Status  New    Target Date  06/21/18        PT Long Term Goals - 05/31/18 1016      PT LONG TERM GOAL #1   Title  pt will improve her FOTO score from 61 % limiation to 52 % limitation.     Time  6    Period  Weeks    Status  New    Target Date  07/12/18      PT LONG TERM GOAL #2   Title  Pt will improve her low back pain from 5/10 to </= 2/10 with walking community distances to perform grocery shopping.     Time  6    Period  Weeks    Status  New    Target Date  07/12/18      PT LONG TERM GOAL #3   Title  Pt will be able to self correct her posture with  sitting and standing.     Time  6    Period  Weeks    Status  New    Target Date  07/12/18      PT LONG TERM GOAL #4   Title  Pt will improve her Left LE strength to >/= 4/5 in order to improve functional mobility and gait.     Time  6    Period  Weeks    Status  New    Target Date  07/12/18      PT LONG TERM GOAL #5   Title  Pt will improve her 5 time sit to stand to </= 15 seconds in order to improve functional mobiltiy.     Baseline  currently on 05/31/18 = 23 seconds using UE support    Time  6    Period  Weeks    Status  New    Target Date  07/12/18             Plan - 05/31/18 1230    Clinical Impression Statement  Pt arrviving to therapy reporting low back pain with increased lordosis noted. Pt with tenderness over lumbar paraspinals on the left side and active trigger points noted on left side. Pt with decreased strength in bilateral hips with left side weakner than right. Pt also with posterior lean when standing with decreased a balance. Skilled PT needed to address pt's impairments with the below interventsion    History and Personal Factors relevant to plan of care:  3 previous back surgeries, HTN, DM, OA, sleep disorder,     Clinical Presentation  Stable    Clinical Decision Making  Low    Rehab Potential  Good    PT Frequency  2x / week    PT Duration  6 weeks    PT Treatment/Interventions  ADLs/Self Care Home Management;Balance training;Neuromuscular re-education;Cryotherapy;Electrical Stimulation;Moist Heat;Therapeutic exercise;Manual techniques;Patient/family education;Passive range of motion;Dry needling;Taping;Gait training;Stair training;Functional mobility training;Therapeutic activities;Ultrasound    PT Next Visit Plan  lumbar stretching, core strengthening, hamstring stretches, modalities as needed, Nustep, posture correction     PT Home Exercise Plan  hamstring stretches, hip abduction with green theraband    Consulted and Agree with Plan of Care  Patient        Patient will benefit from skilled therapeutic intervention in order to improve the following deficits and impairments:  Pain, Decreased activity tolerance, Decreased strength, Decreased mobility, Decreased balance, Decreased range of motion, Postural dysfunction, Difficulty walking  Visit Diagnosis: Chronic left-sided low back pain with left-sided sciatica  Difficulty in walking, not elsewhere classified  Muscle weakness (generalized)  Abnormal posture     Problem List Patient Active Problem List   Diagnosis Date Noted  . IDA (iron deficiency anemia) 10/07/2017  .  Respiratory failure with hypoxia (HCC) 03/18/2012  . Healthcare-associated pneumonia 03/18/2012  . COPD (chronic obstructive pulmonary disease) (HCC) 03/18/2012  . COPD exacerbation (HCC) 03/02/2012  . Hyponatremia 03/02/2012  . Anemia 03/02/2012  . HTN (hypertension) 03/02/2012  . DM type 2 (diabetes mellitus, type 2) (HCC) 03/02/2012    Sharmon Leyden, MPT 05/31/2018, 12:56 PM  [facility] [address] [place], [place], [address] Phone: [phone]   Fax:  [phone]  Name: Angelica [name] MRN: [id number] Date of Birth: March 04, 1946

## 2018-06-05 ENCOUNTER — Encounter: Payer: Self-pay | Admitting: Physical Therapy

## 2018-06-05 ENCOUNTER — Ambulatory Visit: Payer: Medicare Other | Admitting: Physical Therapy

## 2018-06-05 DIAGNOSIS — R293 Abnormal posture: Secondary | ICD-10-CM

## 2018-06-05 DIAGNOSIS — R262 Difficulty in walking, not elsewhere classified: Secondary | ICD-10-CM

## 2018-06-05 DIAGNOSIS — M5442 Lumbago with sciatica, left side: Principal | ICD-10-CM

## 2018-06-05 DIAGNOSIS — M6281 Muscle weakness (generalized): Secondary | ICD-10-CM

## 2018-06-05 DIAGNOSIS — G8929 Other chronic pain: Secondary | ICD-10-CM

## 2018-06-05 NOTE — Therapy (Signed)
[facility] [address] [place], [place], [address] Phone: [phone]   Fax:  [phone]  Physical Therapy Treatment  Patient Details  Name: Angelica HeritageVickie S [name] MRN: [id number] Date of Birth: 12/25/1945 Referring Provider: Lisbeth RenshawNeelesh Nundkumar, MD   Encounter Date: 06/05/2018  PT End of Session - 06/05/18 0914    Visit Number  2    Number of Visits  13    Authorization Type  [employer]HC, medicare    PT Start Time  0914    PT Stop Time  0952 2 units secondary to late arrival    PT Time Calculation (min)  38 min    Activity Tolerance  Patient limited by pain    Behavior During Therapy  [facility] for tasks assessed/performed       Past Medical History:  Diagnosis Date  . Arthritis   . Asthma   . COPD (chronic obstructive pulmonary disease) (HCC)   . Diabetes mellitus   . Hypertension     Past Surgical History:  Procedure Laterality Date  . ABDOMINAL HYSTERECTOMY    . BACK SURGERY    . CESAREAN SECTION    . CHOLECYSTECTOMY    . FOOT SURGERY    . TONSILLECTOMY    . TOTAL KNEE ARTHROPLASTY    . TUBAL LIGATION      There were no vitals filed for this visit.  Subjective Assessment - 06/05/18 0913    Subjective  Reports that she has more difficulties in the mornings.    Pertinent History  3 previous back surgeries    Limitations  Walking    How long can you sit comfortably?  about 30 minutes, but I have to reposition often off my left hip    How long can you walk comfortably?  5 minutes    Currently in Pain?  Yes    Pain Score  7     Pain Location  Back    Pain Orientation  Left;Lower    Pain Descriptors / Indicators  Aching    Pain Type  Chronic pain    Pain Onset  More than a month ago         [facility] PT Assessment - 06/05/18 0001      Assessment   Medical Diagnosis  low back pain with left sciatica    Hand Dominance  Right    Next MD Visit  06/06/2018 for injection    Prior Therapy  no      Precautions   Precautions  None       Restrictions   Weight Bearing Restrictions  No                   [facility] Adult PT Treatment/Exercise - 06/05/18 0001      Modalities   Modalities  Electrical Stimulation;Moist Heat      Moist Heat Therapy   Number Minutes Moist Heat  15 Minutes    Moist Heat Location  Lumbar Spine      Electrical Stimulation   Electrical Stimulation Location  L low back/ glute    Electrical Stimulation Action  Pre-Mod    Electrical Stimulation Parameters  80-150 hz x15 min    Electrical Stimulation Goals  Pain;Tone      Manual Therapy   Manual Therapy  Soft tissue mobilization    Soft tissue mobilization  STW to L lumbar paraspinals, glute to reduce pain and muscle tightness/ TPs  PT Short Term Goals - 05/31/18 1016      PT SHORT TERM GOAL #1   Title  Pt will be independent in her HEP.     Time  3    Period  Weeks    Status  New    Target Date  06/21/18        PT Long Term Goals - 05/31/18 1016      PT LONG TERM GOAL #1   Title  pt will improve her FOTO score from 61 % limiation to 52 % limitation.     Time  6    Period  Weeks    Status  New    Target Date  07/12/18      PT LONG TERM GOAL #2   Title  Pt will improve her low back pain from 5/10 to </= 2/10 with walking community distances to perform grocery shopping.     Time  6    Period  Weeks    Status  New    Target Date  07/12/18      PT LONG TERM GOAL #3   Title  Pt will be able to self correct her posture with sitting and standing.     Time  6    Period  Weeks    Status  New    Target Date  07/12/18      PT LONG TERM GOAL #4   Title  Pt will improve her Left LE strength to >/= 4/5 in order to improve functional mobility and gait.     Time  6    Period  Weeks    Status  New    Target Date  07/12/18      PT LONG TERM GOAL #5   Title  Pt will improve her 5 time sit to stand to </= 15 seconds in order to improve functional mobiltiy.     Baseline  currently on 05/31/18 = 23 seconds  using UE support    Time  6    Period  Weeks    Status  New    Target Date  07/12/18            Plan - 06/05/18 0958    Clinical Impression Statement  Patient presented in clinic with increased L LBP and with radiation into L lateral thigh. Patient presented with increased TPs and muscle tightness in L lumbar paraspinals, glute region. Patient very tender to palpation of L lumbar paraspinals just lateral to previous surgery incicison. Patient also very tender to manual therapy around L superior glute as well. Normal modalities response noted following removal of the modalities.    Rehab Potential  Good    PT Frequency  2x / week    PT Duration  6 weeks    PT Treatment/Interventions  ADLs/Self Care Home Management;Balance training;Neuromuscular re-education;Cryotherapy;Electrical Stimulation;Moist Heat;Therapeutic exercise;Manual techniques;Patient/family education;Passive range of motion;Dry needling;Taping;Gait training;Stair training;Functional mobility training;Therapeutic activities;Ultrasound    PT Next Visit Plan  lumbar stretching, core strengthening, hamstring stretches, modalities as needed, Nustep, posture correction     PT Home Exercise Plan  hamstring stretches, hip abduction with green theraband    Consulted and Agree with Plan of Care  Patient       Patient will benefit from skilled therapeutic intervention in order to improve the following deficits and impairments:  Pain, Decreased activity tolerance, Decreased strength, Decreased mobility, Decreased balance, Decreased range of motion, Postural dysfunction, Difficulty walking  Visit Diagnosis: Chronic left-sided low  back pain with left-sided sciatica  Difficulty in walking, not elsewhere classified  Muscle weakness (generalized)  Abnormal posture     Problem List Patient Active Problem List   Diagnosis Date Noted  . IDA (iron deficiency anemia) 10/07/2017  . Respiratory failure with hypoxia (HCC) 03/18/2012   . Healthcare-associated pneumonia 03/18/2012  . COPD (chronic obstructive pulmonary disease) (HCC) 03/18/2012  . COPD exacerbation (HCC) 03/02/2012  . Hyponatremia 03/02/2012  . Anemia 03/02/2012  . HTN (hypertension) 03/02/2012  . DM type 2 (diabetes mellitus, type 2) (HCC) 03/02/2012    Marvell Fuller, PTA 06/05/2018, 10:12 AM  [facility] [address] [place], [place], [address] Phone: [phone]   Fax:  [phone]  Name: PATICIA MOSTER MRN: [id number] Date of Birth: September 24, 1946

## 2018-06-06 ENCOUNTER — Ambulatory Visit
Admission: RE | Admit: 2018-06-06 | Discharge: 2018-06-06 | Disposition: A | Discharge status: Home / Self Care | Payer: Medicare Other | Source: Ambulatory Visit | Attending: Neurosurgery | Admitting: Neurosurgery

## 2018-06-06 DIAGNOSIS — M5442 Lumbago with sciatica, left side: Principal | ICD-10-CM

## 2018-06-06 DIAGNOSIS — G8929 Other chronic pain: Secondary | ICD-10-CM

## 2018-06-06 MED ORDER — METHYLPREDNISOLONE ACETATE 40 MG/ML INJ SUSP (RADIOLOG
120.0000 mg | Freq: Once | INTRAMUSCULAR | Status: AC
  Administered 2018-06-06: 120 mg via EPIDURAL

## 2018-06-06 MED ORDER — IOPAMIDOL (ISOVUE-M 200) INJECTION 41%
1.0000 mL | Freq: Once | INTRAMUSCULAR | Status: AC
  Administered 2018-06-06: 1 mL via EPIDURAL

## 2018-06-06 NOTE — Discharge Instructions (Signed)

## 2018-06-12 ENCOUNTER — Encounter: Payer: Medicare Other | Admitting: Physical Therapy

## 2018-06-28 ENCOUNTER — Other Ambulatory Visit: Payer: Self-pay | Admitting: Physician Assistant

## 2018-06-28 DIAGNOSIS — G8929 Other chronic pain: Secondary | ICD-10-CM

## 2018-06-28 DIAGNOSIS — M5442 Lumbago with sciatica, left side: Principal | ICD-10-CM

## 2018-07-10 ENCOUNTER — Ambulatory Visit
Admission: RE | Admit: 2018-07-10 | Discharge: 2018-07-10 | Disposition: A | Discharge status: Home / Self Care | Payer: Medicare Other | Source: Ambulatory Visit | Attending: Physician Assistant | Admitting: Physician Assistant

## 2018-07-10 DIAGNOSIS — M5442 Lumbago with sciatica, left side: Principal | ICD-10-CM

## 2018-07-10 DIAGNOSIS — G8929 Other chronic pain: Secondary | ICD-10-CM

## 2018-07-10 MED ORDER — METHYLPREDNISOLONE ACETATE 40 MG/ML INJ SUSP (RADIOLOG
120.0000 mg | Freq: Once | INTRAMUSCULAR | Status: AC
  Administered 2018-07-10: 120 mg via EPIDURAL

## 2018-07-10 MED ORDER — IOPAMIDOL (ISOVUE-M 200) INJECTION 41%
1.0000 mL | Freq: Once | INTRAMUSCULAR | Status: AC
  Administered 2018-07-10: 1 mL via EPIDURAL

## 2018-07-18 ENCOUNTER — Other Ambulatory Visit: Payer: Self-pay

## 2018-07-18 ENCOUNTER — Inpatient Hospital Stay: Payer: Medicare Other | Attending: Hematology & Oncology | Admitting: Family

## 2018-07-18 ENCOUNTER — Inpatient Hospital Stay: Payer: Medicare Other

## 2018-07-18 VITALS — BP 156/52 | HR 85 | Temp 98.3°F | Resp 20 | Wt 201.5 lb

## 2018-07-18 DIAGNOSIS — D509 Iron deficiency anemia, unspecified: Secondary | ICD-10-CM

## 2018-07-18 DIAGNOSIS — K909 Intestinal malabsorption, unspecified: Secondary | ICD-10-CM | POA: Diagnosis not present

## 2018-07-18 DIAGNOSIS — Z79899 Other long term (current) drug therapy: Secondary | ICD-10-CM | POA: Insufficient documentation

## 2018-07-18 DIAGNOSIS — D508 Other iron deficiency anemias: Secondary | ICD-10-CM

## 2018-07-18 LAB — CBC WITH DIFFERENTIAL (CANCER CENTER ONLY)
BASOS PCT: 0 %
Basophils Absolute: 0 10*3/uL (ref 0.0–0.1)
Eosinophils Absolute: 0.1 10*3/uL (ref 0.0–0.5)
Eosinophils Relative: 2 %
HEMATOCRIT: 33.7 % — AB (ref 34.8–46.6)
HEMOGLOBIN: 10.7 g/dL — AB (ref 11.6–15.9)
Lymphocytes Relative: 25 %
Lymphs Abs: 2.2 10*3/uL (ref 0.9–3.3)
MCH: 31.6 pg (ref 26.0–34.0)
MCHC: 31.8 g/dL — AB (ref 32.0–36.0)
MCV: 99.4 fL (ref 81.0–101.0)
MONOS PCT: 8 %
Monocytes Absolute: 0.7 10*3/uL (ref 0.1–0.9)
NEUTROS ABS: 5.6 10*3/uL (ref 1.5–6.5)
NEUTROS PCT: 65 %
Platelet Count: 474 10*3/uL — ABNORMAL HIGH (ref 145–400)
RBC: 3.39 MIL/uL — AB (ref 3.70–5.32)
RDW: 12.9 % (ref 11.1–15.7)
WBC: 8.7 10*3/uL (ref 3.9–10.0)

## 2018-07-18 LAB — RETICULOCYTES
RBC.: 3.41 MIL/uL — AB (ref 3.70–5.45)
RETIC CT PCT: 2.9 % — AB (ref 0.7–2.1)
Retic Count, Absolute: 98.9 10*3/uL — ABNORMAL HIGH (ref 33.7–90.7)

## 2018-07-18 NOTE — Progress Notes (Signed)
Hematology and Oncology Follow Up Visit  Angelica [name] [id number] 09/26/1946 72 y.o. 07/18/2018   Principle Diagnosis:  Iron deficiency anemia  Current Therapy:   IV iron as indicated- last received inJune 2019   Interim History: Angelica [name] is here today for follow-up. She is getting over an upper respiratory infection. She has completed an antibiotic and steroid taper and is still using a nebulizer as needed.  She has a dry cough and occasional SOB with over exertion.  No fever, chills, n/v, rash, dizziness,  chest pain, palpitations, abdominal pain or changes in bowel or bladder habits.  She uses Miralax or stool softeners as needed for constipation.  No episodes of bleeding. She does bruise easily but not in excess.  The swelling in her lower extremities is unchanged. The numbness and tingling in her hands seems to be better.  She has received steroid injections for her back which seems to have helped.  No falls or syncopal episodes to report.  She has a good appetite and is staying well hydrated. Her weight is stable.    ECOG Performance Status: 1 - Symptomatic but completely ambulatory  Medications:  Allergies as of 07/18/2018      Reactions   Amoxicillin-pot Clavulanate Other (See Comments)   Ceftriaxone Rash   Morphine And Related Rash   Quinine Other (See Comments)   Rocephin [ceftriaxone Sodium In Dextrose] Rash, Other (See Comments)   Quinine Derivatives    Decreased platelet counts      Medication List        Accurate as of 07/18/18 10:34 AM. Always use your most recent med list.          ADVAIR DISKUS 250-50 MCG/DOSE Aepb Generic drug:  Fluticasone-Salmeterol Inhale 1 puff into the lungs every 12 (twelve) hours.   albuterol (2.5 MG/3ML) 0.083% nebulizer solution Commonly known as:  PROVENTIL   cyclobenzaprine 10 MG tablet Commonly known as:  FLEXERIL Take 10 mg by mouth 3 (three) times daily as needed. For muscle spasms   diltiazem 120 MG 24 hr  capsule Commonly known as:  TIAZAC Take 120 mg by mouth at bedtime.   diltiazem 120 MG 24 hr capsule Commonly known as:  CARDIZEM CD Take 120 mg by mouth daily.   diphenhydramine-acetaminophen 25-500 MG Tabs tablet Commonly known as:  TYLENOL PM Take 1 tablet by mouth at bedtime as needed. For sleep   docusate sodium 100 MG capsule Commonly known as:  COLACE Take 100 mg by mouth at bedtime.   DULoxetine 60 MG capsule Commonly known as:  CYMBALTA Take 60 mg by mouth daily.   ergocalciferol 50000 units capsule Commonly known as:  VITAMIN D2 Take 50,000 Units by mouth once a week. On Saturdays   estradiol 1 MG tablet Commonly known as:  ESTRACE Take 1 mg by mouth at bedtime.   fenofibrate 160 MG tablet Take 160 mg by mouth at bedtime.   furosemide 20 MG tablet Commonly known as:  LASIX TAKE ONE (1) TABLET EACH DAY   glipiZIDE 10 MG 24 hr tablet Commonly known as:  GLUCOTROL XL Take 10 mg by mouth daily.   ibuprofen 200 MG tablet Commonly known as:  ADVIL,MOTRIN Take 400 mg by mouth every 6 (six) hours as needed. For pain   losartan 100 MG tablet Commonly known as:  COZAAR   metFORMIN 500 MG (MOD) 24 hr tablet Commonly known as:  GLUMETZA Take 500 mg by mouth 2 (two) times daily with a meal.  omeprazole 20 MG capsule Commonly known as:  PRILOSEC Take 20 mg by mouth 2 (two) times daily.   sitaGLIPtin 100 MG tablet Commonly known as:  JANUVIA Take 100 mg by mouth daily.   SPIRIVA HANDIHALER 18 MCG inhalation capsule Generic drug:  tiotropium USE 1 PUFF DAILY   triamterene-hydrochlorothiazide 37.5-25 MG tablet Commonly known as:  MAXZIDE-25 Take 1 tablet by mouth every morning.       Allergies:  Allergies  Allergen Reactions  . Amoxicillin-Pot Clavulanate Other (See Comments)  . Ceftriaxone Rash  . Morphine And Related Rash  . Quinine Other (See Comments)  . Rocephin [Ceftriaxone Sodium In Dextrose] Rash and Other (See Comments)  . Quinine  Derivatives     Decreased platelet counts    Past Medical History, Surgical history, Social history, and Family History were reviewed and updated.  Review of Systems: All other 10 point review of systems is negative.   Physical Exam:  weight is 201 lb 8 oz (91.4 kg). Her oral temperature is 98.3 F (36.8 C). Her blood pressure is 156/52 (abnormal) and her pulse is 85. Her respiration is 20 and oxygen saturation is 94%.   Wt Readings from Last 3 Encounters:  07/18/18 201 lb 8 oz (91.4 kg)  05/15/18 204 lb 4 oz (92.6 kg)  03/13/18 208 lb 4 oz (94.5 kg)    Ocular: Sclerae unicteric, pupils equal, round and reactive to light Ear-nose-throat: Oropharynx clear, dentition fair Lymphatic: No cervical, supraclavicular or axillary adenopathy Lungs no rales or rhonchi, good excursion bilaterally Heart regular rate and rhythm, no murmur appreciated Abd soft, nontender, positive bowel sounds, no liver or spleen tip palpated on exam, no fluid wave  MSK no focal spinal tenderness, no joint edema Neuro: non-focal, well-oriented, appropriate affect Breasts: Deferred   Lab Results  Component Value Date   WBC 8.7 07/18/2018   HGB 10.7 (L) 07/18/2018   HCT 33.7 (L) 07/18/2018   MCV 99.4 07/18/2018   PLT 474 (H) 07/18/2018   Lab Results  Component Value Date   FERRITIN 96 05/15/2018   IRON 43 05/15/2018   TIBC 320 05/15/2018   UIBC 277 05/15/2018   IRONPCTSAT 13 (L) 05/15/2018   Lab Results  Component Value Date   RETICCTPCT 3.6 (H) 05/15/2018   RBC 3.39 (L) 07/18/2018   No results found for: KPAFRELGTCHN, LAMBDASER, KAPLAMBRATIO No results found for: IGGSERUM, IGA, IGMSERUM No results found for: Georgann Housekeeper, MSPIKE, SPEI   Chemistry      Component Value Date/Time   NA 140 10/06/2017 1444   K 3.8 10/06/2017 1444   CL 99 10/06/2017 1444   CO2 29 10/06/2017 1444   BUN 16 10/06/2017 1444   CREATININE 0.7 10/06/2017 1444        Component Value Date/Time   CALCIUM 9.7 10/06/2017 1444   ALKPHOS 56 10/06/2017 1444   AST 21 10/06/2017 1444   ALT 20 10/06/2017 1444   BILITOT 0.40 10/06/2017 1444      Impression and Plan: Angelica [name] is a very pleasant 72 yo caucasian female with iron deficiency anemia secondary to malabsorption. She is doing well but getting over a recent upper respiratory infection.  We will see what her iron studies show and bring her back in for follow-up if needed.  We will plan to see her back in another 2 months for follow-up.  She will contact our office with any questions or concerns. We can certainly see her sooner if need be.  Emeline Gins, NP 7/31/201910:34 AM

## 2018-07-19 LAB — IRON AND TIBC
IRON: 47 ug/dL (ref 41–142)
Saturation Ratios: 12 % — ABNORMAL LOW (ref 21–57)
TIBC: 398 ug/dL (ref 236–444)
UIBC: 351 ug/dL

## 2018-07-19 LAB — FERRITIN: FERRITIN: 55 ng/mL (ref 11–307)

## 2018-07-30 ENCOUNTER — Inpatient Hospital Stay: Payer: Medicare Other

## 2018-07-31 ENCOUNTER — Inpatient Hospital Stay: Payer: Medicare Other | Attending: Hematology & Oncology

## 2018-07-31 ENCOUNTER — Other Ambulatory Visit: Payer: Self-pay

## 2018-07-31 VITALS — BP 158/64 | HR 80

## 2018-07-31 DIAGNOSIS — K909 Intestinal malabsorption, unspecified: Secondary | ICD-10-CM | POA: Diagnosis not present

## 2018-07-31 DIAGNOSIS — D509 Iron deficiency anemia, unspecified: Secondary | ICD-10-CM | POA: Diagnosis present

## 2018-07-31 DIAGNOSIS — D508 Other iron deficiency anemias: Secondary | ICD-10-CM

## 2018-07-31 MED ORDER — SODIUM CHLORIDE 0.9 % IV SOLN
Freq: Once | INTRAVENOUS | Status: AC
  Administered 2018-07-31: 09:00:00 via INTRAVENOUS
  Filled 2018-07-31: qty 250

## 2018-07-31 MED ORDER — FERUMOXYTOL INJECTION 510 MG/17 ML
510.0000 mg | Freq: Once | INTRAVENOUS | Status: AC
  Administered 2018-07-31: 510 mg via INTRAVENOUS
  Filled 2018-07-31: qty 17

## 2018-07-31 NOTE — Patient Instructions (Signed)

## 2018-09-18 ENCOUNTER — Ambulatory Visit: Payer: Medicare Other | Admitting: Family

## 2018-09-18 ENCOUNTER — Other Ambulatory Visit: Payer: Medicare Other

## 2018-09-25 ENCOUNTER — Encounter: Payer: Self-pay | Admitting: Family

## 2018-09-25 ENCOUNTER — Other Ambulatory Visit: Payer: Self-pay

## 2018-09-25 ENCOUNTER — Inpatient Hospital Stay: Payer: Medicare Other | Attending: Hematology & Oncology

## 2018-09-25 ENCOUNTER — Inpatient Hospital Stay (HOSPITAL_BASED_OUTPATIENT_CLINIC_OR_DEPARTMENT_OTHER): Payer: Medicare Other | Admitting: Family

## 2018-09-25 VITALS — BP 155/57 | HR 87 | Temp 98.3°F | Resp 18 | Wt 208.0 lb

## 2018-09-25 DIAGNOSIS — J40 Bronchitis, not specified as acute or chronic: Secondary | ICD-10-CM

## 2018-09-25 DIAGNOSIS — Z79899 Other long term (current) drug therapy: Secondary | ICD-10-CM | POA: Insufficient documentation

## 2018-09-25 DIAGNOSIS — K909 Intestinal malabsorption, unspecified: Secondary | ICD-10-CM | POA: Insufficient documentation

## 2018-09-25 DIAGNOSIS — D508 Other iron deficiency anemias: Secondary | ICD-10-CM

## 2018-09-25 DIAGNOSIS — D509 Iron deficiency anemia, unspecified: Secondary | ICD-10-CM | POA: Insufficient documentation

## 2018-09-25 DIAGNOSIS — D631 Anemia in chronic kidney disease: Secondary | ICD-10-CM

## 2018-09-25 LAB — IRON AND TIBC
IRON: 16 ug/dL — AB (ref 41–142)
Saturation Ratios: 5 % — ABNORMAL LOW (ref 21–57)
TIBC: 348 ug/dL (ref 236–444)
UIBC: 332 ug/dL

## 2018-09-25 LAB — CBC WITH DIFFERENTIAL (CANCER CENTER ONLY)
BASOS PCT: 0 %
Basophils Absolute: 0 10*3/uL (ref 0.0–0.1)
EOS ABS: 0 10*3/uL (ref 0.0–0.5)
EOS PCT: 0 %
HCT: 29.1 % — ABNORMAL LOW (ref 34.8–46.6)
Hemoglobin: 9.2 g/dL — ABNORMAL LOW (ref 11.6–15.9)
Lymphocytes Relative: 18 %
Lymphs Abs: 1.8 10*3/uL (ref 0.9–3.3)
MCH: 31.4 pg (ref 26.0–34.0)
MCHC: 31.6 g/dL — AB (ref 32.0–36.0)
MCV: 99.3 fL (ref 81.0–101.0)
MONOS PCT: 8 %
Monocytes Absolute: 0.8 10*3/uL (ref 0.1–0.9)
NEUTROS PCT: 74 %
Neutro Abs: 7.6 10*3/uL — ABNORMAL HIGH (ref 1.5–6.5)
PLATELETS: 562 10*3/uL — AB (ref 145–400)
RBC: 2.93 MIL/uL — ABNORMAL LOW (ref 3.70–5.32)
RDW: 12.6 % (ref 11.1–15.7)
WBC Count: 10.2 10*3/uL — ABNORMAL HIGH (ref 3.9–10.0)

## 2018-09-25 LAB — RETICULOCYTES
Immature Retic Fract: 14.8 % (ref 2.3–15.9)
RBC.: 2.93 MIL/uL — AB (ref 3.87–5.11)
RETIC COUNT ABSOLUTE: 80 10*3/uL (ref 19.0–186.0)
Retic Ct Pct: 2.7 % (ref 0.4–3.1)

## 2018-09-25 LAB — FERRITIN: Ferritin: 81 ng/mL (ref 11–307)

## 2018-09-25 NOTE — Progress Notes (Signed)
Hematology and Oncology Follow Up Visit  AUDRYANNA ZURITA [id number] 1946/02/19 72 y.o. 09/25/2018   Principle Diagnosis:  Iron deficiency anemia  Current Therapy:   IV iron as indicated- last received inAugust 2019   Interim History: Ms. Mungin is here today for follow-up. She has bronchitis and her PCP ordered Levaquin PO which caused itching. This resolved with benadryl yesterday and she was changed to a z pack which she will start today.  She has a productive cough with thick green phlegm. She has noted SOB with over exertion and has an expiratory wheeze. She is feeling fatigued.  No fever, chills, n/v, rash, dizziness, chest pain, palpitations, abdominal pain or changes in bowel or bladder habits.  The puffiness in her lower extremities is unchanged. The numbness and tingling in her fingertips is stable.  No lymphadenopathy noted on exam.  No episodes of bleeding. Her skin is thin and she does bruise easily.  She has maintained a good appetite and is staying well hydrated. Her weight is stable.   ECOG Performance Status: 1 - Symptomatic but completely ambulatory  Medications:  Allergies as of 09/25/2018      Reactions   Amoxicillin-pot Clavulanate Other (See Comments)   Ceftriaxone Rash   Morphine And Related Rash   Quinine Other (See Comments)   Rocephin [ceftriaxone Sodium In Dextrose] Rash, Other (See Comments)   Quinine Derivatives    Decreased platelet counts      Medication List        Accurate as of 09/25/18  9:48 AM. Always use your most recent med list.          ADVAIR DISKUS 250-50 MCG/DOSE Aepb Generic drug:  Fluticasone-Salmeterol Inhale 1 puff into the lungs every 12 (twelve) hours.   albuterol (2.5 MG/3ML) 0.083% nebulizer solution Commonly known as:  PROVENTIL   cyclobenzaprine 10 MG tablet Commonly known as:  FLEXERIL Take 10 mg by mouth 3 (three) times daily as needed. For muscle spasms   diltiazem 120 MG 24 hr capsule Commonly known as:   TIAZAC Take 120 mg by mouth at bedtime.   diltiazem 120 MG 24 hr capsule Commonly known as:  CARDIZEM CD Take 120 mg by mouth daily.   diphenhydramine-acetaminophen 25-500 MG Tabs tablet Commonly known as:  TYLENOL PM Take 1 tablet by mouth at bedtime as needed. For sleep   docusate sodium 100 MG capsule Commonly known as:  COLACE Take 100 mg by mouth at bedtime.   DULoxetine 60 MG capsule Commonly known as:  CYMBALTA Take 60 mg by mouth daily.   ergocalciferol 50000 units capsule Commonly known as:  VITAMIN D2 Take 50,000 Units by mouth once a week. On Saturdays   estradiol 1 MG tablet Commonly known as:  ESTRACE Take 1 mg by mouth at bedtime.   fenofibrate 160 MG tablet Take 160 mg by mouth at bedtime.   furosemide 20 MG tablet Commonly known as:  LASIX TAKE ONE (1) TABLET EACH DAY   glipiZIDE 10 MG 24 hr tablet Commonly known as:  GLUCOTROL XL Take 10 mg by mouth daily.   ibuprofen 200 MG tablet Commonly known as:  ADVIL,MOTRIN Take 400 mg by mouth every 6 (six) hours as needed. For pain   losartan 100 MG tablet Commonly known as:  COZAAR   metFORMIN 500 MG (MOD) 24 hr tablet Commonly known as:  GLUMETZA Take 500 mg by mouth 2 (two) times daily with a meal.   omeprazole 20 MG capsule Commonly known as:  PRILOSEC Take 20 mg by mouth 2 (two) times daily.   sitaGLIPtin 100 MG tablet Commonly known as:  JANUVIA Take 100 mg by mouth daily.   SPIRIVA HANDIHALER 18 MCG inhalation capsule Generic drug:  tiotropium USE 1 PUFF DAILY   triamterene-hydrochlorothiazide 37.5-25 MG tablet Commonly known as:  MAXZIDE-25 Take 1 tablet by mouth every morning.       Allergies:  Allergies  Allergen Reactions  . Amoxicillin-Pot Clavulanate Other (See Comments)  . Ceftriaxone Rash  . Morphine And Related Rash  . Quinine Other (See Comments)  . Rocephin [Ceftriaxone Sodium In Dextrose] Rash and Other (See Comments)  . Quinine Derivatives     Decreased platelet  counts    Past Medical History, Surgical history, Social history, and Family History were reviewed and updated.  Review of Systems: All other 10 point review of systems is negative.   Physical Exam:  weight is 208 lb (94.3 kg). Her oral temperature is 98.3 F (36.8 C). Her blood pressure is 155/57 (abnormal) and her pulse is 87. Her respiration is 18 and oxygen saturation is 98%.   Wt Readings from Last 3 Encounters:  09/25/18 208 lb (94.3 kg)  07/18/18 201 lb 8 oz (91.4 kg)  05/15/18 204 lb 4 oz (92.6 kg)    Ocular: Sclerae unicteric, pupils equal, round and reactive to light Ear-nose-throat: Oropharynx clear, dentition fair Lymphatic: No cervical, supraclavicular or axillary adenopathy Lungs no rales or rhonchi, good excursion bilaterally Heart regular rate and rhythm, no murmur appreciated Abd soft, nontender, positive bowel sounds, no liver or spleen tip palpated on exam, no fluid wave  MSK no focal spinal tenderness, no joint edema Neuro: non-focal, well-oriented, appropriate affect Breasts: Deferred   Lab Results  Component Value Date   WBC 10.2 (H) 09/25/2018   HGB 9.2 (L) 09/25/2018   HCT 29.1 (L) 09/25/2018   MCV 99.3 09/25/2018   PLT 562 (H) 09/25/2018   Lab Results  Component Value Date   FERRITIN 55 07/18/2018   IRON 47 07/18/2018   TIBC 398 07/18/2018   UIBC 351 07/18/2018   IRONPCTSAT 12 (L) 07/18/2018   Lab Results  Component Value Date   RETICCTPCT 2.9 (H) 07/18/2018   RBC 2.93 (L) 09/25/2018   No results found for: KPAFRELGTCHN, LAMBDASER, KAPLAMBRATIO No results found for: IGGSERUM, IGA, IGMSERUM No results found for: Marda Stalker, SPEI   Chemistry      Component Value Date/Time   NA 140 10/06/2017 1444   K 3.8 10/06/2017 1444   CL 99 10/06/2017 1444   CO2 29 10/06/2017 1444   BUN 16 10/06/2017 1444   CREATININE 0.7 10/06/2017 1444      Component Value Date/Time   CALCIUM 9.7  10/06/2017 1444   ALKPHOS 56 10/06/2017 1444   AST 21 10/06/2017 1444   ALT 20 10/06/2017 1444   BILITOT 0.40 10/06/2017 1444      Impression and Plan: Ms. Zeoli is a very pleasant 72 yo caucasian female with iron deficiency anemia secondary to malabsorption. She currently has bronchitis and is symptomatic with fatigue, SOB and a cough.  Hgb is 9.2 with an MCV of 99.  We will see what her iron studies show and bring her back in for infusion if needed.  We will plan to see her back in another 2 months for follow-up.  She will contact our office with any questions or concerns. We can certainly see her sooner if need be.  Emeline Gins, NP 10/8/20199:48 AM

## 2018-10-08 ENCOUNTER — Inpatient Hospital Stay: Payer: Medicare Other

## 2018-10-08 VITALS — BP 151/52 | HR 83 | Temp 98.1°F | Resp 20

## 2018-10-08 DIAGNOSIS — D508 Other iron deficiency anemias: Secondary | ICD-10-CM

## 2018-10-08 DIAGNOSIS — D509 Iron deficiency anemia, unspecified: Secondary | ICD-10-CM | POA: Diagnosis not present

## 2018-10-08 MED ORDER — SODIUM CHLORIDE 0.9 % IV SOLN
510.0000 mg | Freq: Once | INTRAVENOUS | Status: AC
  Administered 2018-10-08: 510 mg via INTRAVENOUS
  Filled 2018-10-08: qty 17

## 2018-10-08 MED ORDER — SODIUM CHLORIDE 0.9 % IV SOLN
INTRAVENOUS | Status: DC
  Administered 2018-10-08: 09:00:00 via INTRAVENOUS
  Filled 2018-10-08: qty 250

## 2018-10-08 NOTE — Progress Notes (Signed)
Feraheme infused over 15 minutes and pt observed for 30 minutes post feraheme.  Pt without complaints at time of discharge.

## 2018-10-08 NOTE — Patient Instructions (Signed)

## 2018-10-16 ENCOUNTER — Other Ambulatory Visit: Payer: Self-pay

## 2018-10-16 ENCOUNTER — Emergency Department (HOSPITAL_COMMUNITY)
Admission: EM | Admit: 2018-10-16 | Discharge: 2018-10-16 | Disposition: A | Discharge status: Home / Self Care | Payer: Medicare Other | Attending: Emergency Medicine | Admitting: Emergency Medicine

## 2018-10-16 ENCOUNTER — Inpatient Hospital Stay: Payer: Medicare Other

## 2018-10-16 ENCOUNTER — Emergency Department (HOSPITAL_COMMUNITY): Payer: Medicare Other

## 2018-10-16 ENCOUNTER — Encounter (HOSPITAL_COMMUNITY): Payer: Self-pay | Admitting: *Deleted

## 2018-10-16 DIAGNOSIS — Y998 Other external cause status: Secondary | ICD-10-CM | POA: Diagnosis not present

## 2018-10-16 DIAGNOSIS — Y9389 Activity, other specified: Secondary | ICD-10-CM | POA: Diagnosis not present

## 2018-10-16 DIAGNOSIS — Y9289 Other specified places as the place of occurrence of the external cause: Secondary | ICD-10-CM | POA: Diagnosis not present

## 2018-10-16 DIAGNOSIS — W010XXA Fall on same level from slipping, tripping and stumbling without subsequent striking against object, initial encounter: Secondary | ICD-10-CM | POA: Insufficient documentation

## 2018-10-16 DIAGNOSIS — J45909 Unspecified asthma, uncomplicated: Secondary | ICD-10-CM | POA: Diagnosis not present

## 2018-10-16 DIAGNOSIS — S42401A Unspecified fracture of lower end of right humerus, initial encounter for closed fracture: Secondary | ICD-10-CM | POA: Insufficient documentation

## 2018-10-16 DIAGNOSIS — J449 Chronic obstructive pulmonary disease, unspecified: Secondary | ICD-10-CM | POA: Diagnosis not present

## 2018-10-16 DIAGNOSIS — E119 Type 2 diabetes mellitus without complications: Secondary | ICD-10-CM | POA: Diagnosis not present

## 2018-10-16 DIAGNOSIS — S4991XA Unspecified injury of right shoulder and upper arm, initial encounter: Secondary | ICD-10-CM | POA: Diagnosis present

## 2018-10-16 DIAGNOSIS — Z79899 Other long term (current) drug therapy: Secondary | ICD-10-CM | POA: Insufficient documentation

## 2018-10-16 MED ORDER — FENTANYL CITRATE (PF) 100 MCG/2ML IJ SOLN
100.0000 ug | Freq: Once | INTRAMUSCULAR | Status: AC
  Administered 2018-10-16: 100 ug via INTRAVENOUS
  Filled 2018-10-16: qty 2

## 2018-10-16 MED ORDER — FENTANYL CITRATE (PF) 100 MCG/2ML IJ SOLN
50.0000 ug | Freq: Once | INTRAMUSCULAR | Status: AC
  Administered 2018-10-16: 50 ug via INTRAVENOUS
  Filled 2018-10-16: qty 2

## 2018-10-16 MED ORDER — OXYCODONE-ACETAMINOPHEN 5-325 MG PO TABS: 1.0000 | ORAL_TABLET | Freq: Three times a day (TID) | ORAL | 0 refills | Status: DC | PRN

## 2018-10-16 MED ORDER — OXYCODONE-ACETAMINOPHEN 5-325 MG PO TABS
1.0000 | ORAL_TABLET | Freq: Once | ORAL | Status: AC
  Administered 2018-10-16: 1 via ORAL
  Filled 2018-10-16: qty 1

## 2018-10-16 NOTE — Progress Notes (Signed)
Orthopedic Tech Progress Note Patient Details:  Angelica [name] Jul 16, 1946 [id number]  Ortho Devices Type of Ortho Device: Arm sling, [name] (long arm) splint Ortho Device/Splint Location: rue Ortho Device/Splint Interventions: Ordered, Application, Adjustment   [name] Interventions Patient Tolerated: Well Instructions Provided: Care of device, Adjustment of device   Angelica [name] 10/16/2018, 4:50 AM

## 2018-10-16 NOTE — ED Notes (Signed)
Ortho tech at bedside to apply splint.  

## 2018-10-16 NOTE — Discharge Instructions (Signed)
As we discussed, you will need to follow-up with referred orthopedic doctor.  Either call the orthopedic doctors at [facility] or follow-up with referred orthopedic doctor in the paperwork.  Keep the splint on for support and stabilization.  Additionally, use a sling to help keep it in place.  You can take 1000 mg of Tylenol.  Do not exceed 4000 mg of Tylenol a day. You can take pain medication for severe or breakthrough pain.   As we discussed, elevate the arm to help with pain and swelling.  Monitor closely for any signs of worsening pain, numbness, discoloration of the fingers.  If you have any of the symptoms, return the emergency department immediately additionally, return for any fever, worsening pain or any other worsening or concerning symptoms.

## 2018-10-16 NOTE — ED Triage Notes (Signed)
Pt says that around 11 pm last night she had a fall onto her right side. She said her toe got hung on a box and she had something in her hand and fell. She did hit her head, no loc, no blood thinners. She c/o pain in the right arm. On palpation, pt has pain in the right elbow and right wrist. She took ibuprofen and flexeril PTA.

## 2018-10-16 NOTE — ED Provider Notes (Signed)
[facility]-EMERGENCY DEPT Provider Note   CSN: [id number] Arrival date & time: 10/16/18  0124     History   Chief Complaint Chief Complaint  Patient presents with  . Fall    HPI Angelica [name] is a 72 y.o. female past medical history of arthritis, asthma, COPD, diabetes who presents for evaluation of right upper extremity pain status post mechanical fall that occurred at approximately 11 PM last night.  Patient reports that she was up walking when she states that her foot got caught on the edge of a box that was on the floor.  This caused her to fall and land on her right side.  She denies any preceding chest pain, dizziness.  She did not hit her head and has no LOC.  Patient reports she has been able to ambulate and bear weight on bilateral lower extremity since incident.  She reports pain to the right upper extremity since then and has had limited range of motion secondary to pain.  Patient took Flexeril and ibuprofen prior to ED arrival with minimal improvement in pain.  Patient denies any vision changes, nausea/vomiting, chest pain, difficulty breathing, abdominal pain, numbness/weakness.  The history is provided by the patient.    Past Medical History:  Diagnosis Date  . Arthritis   . Asthma   . COPD (chronic obstructive pulmonary disease) (HCC)   . Diabetes mellitus   . Hypertension     Patient Active Problem List   Diagnosis Date Noted  . IDA (iron deficiency anemia) 10/07/2017  . Respiratory failure with hypoxia (HCC) 03/18/2012  . Healthcare-associated pneumonia 03/18/2012  . COPD (chronic obstructive pulmonary disease) (HCC) 03/18/2012  . COPD exacerbation (HCC) 03/02/2012  . Hyponatremia 03/02/2012  . Anemia 03/02/2012  . HTN (hypertension) 03/02/2012  . DM type 2 (diabetes mellitus, type 2) (HCC) 03/02/2012    Past Surgical History:  Procedure Laterality Date  . ABDOMINAL HYSTERECTOMY    . BACK SURGERY    . CESAREAN SECTION    .  CHOLECYSTECTOMY    . FOOT SURGERY    . TONSILLECTOMY    . TOTAL KNEE ARTHROPLASTY    . TUBAL LIGATION       OB History   None      Home Medications    Prior to Admission medications   Medication Sig Start Date End Date Taking? Authorizing Provider  albuterol (PROVENTIL) (2.5 MG/3ML) 0.083% nebulizer solution  09/15/17   [provider]  cyclobenzaprine (FLEXERIL) 10 MG tablet Take 10 mg by mouth 3 (three) times daily as needed. For muscle spasms    [provider]  diltiazem (CARDIZEM CD) 120 MG 24 hr capsule Take 120 mg by mouth daily. 11/23/17   [provider]  diltiazem (TIAZAC) 120 MG 24 hr capsule Take 120 mg by mouth at bedtime.    [provider]  diphenhydramine-acetaminophen (TYLENOL PM) 25-500 MG TABS Take 1 tablet by mouth at bedtime as needed. For sleep    [provider]  docusate sodium (COLACE) 100 MG capsule Take 100 mg by mouth at bedtime.    [provider]  DULoxetine (CYMBALTA) 60 MG capsule Take 60 mg by mouth daily.    [provider]  ergocalciferol (VITAMIN D2) 50000 UNITS capsule Take 50,000 Units by mouth once a week. On Saturdays    [provider]  estradiol (ESTRACE) 1 MG tablet Take 1 mg by mouth at bedtime.    [provider]  fenofibrate 160 MG  tablet Take 160 mg by mouth at bedtime.    [provider]  Fluticasone-Salmeterol (ADVAIR DISKUS) 250-50 MCG/DOSE AEPB Inhale 1 puff into the lungs every 12 (twelve) hours.    [provider]  furosemide (LASIX) 20 MG tablet TAKE ONE (1) TABLET EACH DAY 08/28/17   [provider]  glipiZIDE (GLUCOTROL XL) 10 MG 24 hr tablet Take 10 mg by mouth daily. 12/27/17   [provider]  ibuprofen (ADVIL,MOTRIN) 200 MG tablet Take 400 mg by mouth every 6 (six) hours as needed. For pain    [provider]  losartan (COZAAR) 100 MG tablet  08/15/17   [provider]  metFORMIN (GLUMETZA) 500 MG  (MOD) 24 hr tablet Take 500 mg by mouth 2 (two) times daily with a meal.    [provider]  omeprazole (PRILOSEC) 20 MG capsule Take 20 mg by mouth 2 (two) times daily.    [provider]  oxyCODONE-acetaminophen (PERCOCET/ROXICET) 5-325 MG tablet Take 1-2 tablets by mouth every 8 (eight) hours as needed for severe pain. 10/16/18   Maxwell Caul, PA-C  sitaGLIPtin (JANUVIA) 100 MG tablet Take 100 mg by mouth daily.    [provider]  tiotropium (SPIRIVA HANDIHALER) 18 MCG inhalation capsule USE 1 PUFF DAILY 08/17/17   [provider]  triamterene-hydrochlorothiazide (MAXZIDE-25) 37.5-25 MG per tablet Take 1 tablet by mouth every morning.    [provider]    Family History Family History  Problem Relation Age of Onset  . Pneumonia Mother   . Lupus Mother   . Lung cancer Father     Social History Social History   Tobacco Use  . Smoking status: Former Smoker    Packs/day: 1.00    Years: 30.00    Pack years: 30.00    Types: Cigarettes  . Smokeless tobacco: Never Used  Substance Use Topics  . Alcohol use: No  . Drug use: No     Allergies   Levaquin [levofloxacin]; Amoxicillin-pot clavulanate; Ceftriaxone; Morphine and related; Quinine; Rocephin [ceftriaxone sodium in dextrose]; and Quinine derivatives   Review of Systems Review of Systems  Eyes: Negative for visual disturbance.  Respiratory: Negative for shortness of breath.   Cardiovascular: Negative for chest pain.  Gastrointestinal: Negative for abdominal pain, nausea and vomiting.  Musculoskeletal:       RUE pain  Neurological: Negative for weakness, numbness and headaches.  All other systems reviewed and are negative.    Physical Exam Updated Vital Signs BP (!) 154/86 (BP Location: Left Arm)   Pulse (!) 107   Temp 97.9 F (36.6 C) (Oral)   Resp 16   Ht 5\' 1"  (1.549 m)   Wt 92.5 kg   SpO2 94%   BMI 38.55 kg/m   Physical Exam  Constitutional: She is  oriented to person, place, and time. She appears well-developed and well-nourished.  HENT:  Head: Normocephalic and atraumatic.  Mouth/Throat: Oropharynx is clear and moist and mucous membranes are normal.  No tenderness to palpation of skull. No deformities or crepitus noted. No open wounds, abrasions or lacerations.   Eyes: Pupils are equal, round, and reactive to light. Conjunctivae, EOM and lids are normal.  Neck: Full passive range of motion without pain.  Full flexion/extension and lateral movement of neck fully intact. No bony midline tenderness. No deformities or crepitus.   Cardiovascular: Normal rate, regular rhythm, normal heart sounds and normal pulses. Exam reveals no gallop and no friction rub.  No murmur heard. Pulses:  Radial pulses are 2+ on the right side, and 2+ on the left side.  Pulmonary/Chest: Effort normal and breath sounds normal.  Lungs clear to auscultation bilaterally.  Symmetric chest rise.  No wheezing, rales, rhonchi.  Abdominal: Soft. Normal appearance. There is no tenderness. There is no rigidity and no guarding.  Musculoskeletal: Normal range of motion.  Tenderness to palpation noted distal right humerus that overextends of her elbow and extends into the forearm.  There is some overlying soft tissue swelling and ecchymosis.  Compartments are soft.  Tenderness palpation noted to forearm with no deformity or crepitus noted.  Flexion/extension of wrist intact but with subjective reports of pain.  Unable to bend the right elbow secondary to pain.  No tenderness palpation of the proximal humerus, right shoulder.  No tenderness palpation of the left upper extremity.  No tenderness palpation of the bilateral lower extremity's.  Flexion/extension of bilateral lower extremities intact any difficulty.  Neurological: She is alert and oriented to person, place, and time.  Sensation intact along major nerve distributions of BUE Cranial nerves III-XII intact Follows  commands, Moves all extremities  5/5 strength to BLE and LUE Sensation intact throughout all major nerve distributions No slurred speech. No facial droop.   Skin: Skin is warm and dry. Capillary refill takes less than 2 seconds.  Good distal cap refill. RUE is not dusky in appearance or cool to touch.  Psychiatric: She has a normal mood and affect. Her speech is normal.  Nursing note and vitals reviewed.    ED Treatments / Results  Labs (all labs ordered are listed, but only abnormal results are displayed) Labs Reviewed - No data to display  EKG None  Radiology Dg Elbow Complete Right  Result Date: 10/16/2018 CLINICAL DATA:  Fall tonight with right elbow pain. EXAM: RIGHT ELBOW - COMPLETE 3+ VIEW COMPARISON:  None. FINDINGS: Exam demonstrates an oblique displaced fracture of the distal humeral condylar/supracondylar region. Displaced posterior fat pad. Remainder of the exam is unremarkable. IMPRESSION: Oblique displaced distal humeral fracture. Electronically Signed   By: Elberta Fortis M.D.   On: 10/16/2018 02:36   Dg Wrist Complete Right  Result Date: 10/16/2018 CLINICAL DATA:  Fall tonight with ulnar sided elbow pain and right wrist pain. EXAM: RIGHT WRIST - COMPLETE 3+ VIEW COMPARISON:  None. FINDINGS: No evidence of fracture or dislocation. Minimal degenerative change over the radiocarpal joint and radial side of the carpal bones as well as first carpometacarpal joints. IMPRESSION: No acute fracture. Electronically Signed   By: Elberta Fortis M.D.   On: 10/16/2018 02:33    Procedures Procedures (including critical care time)  Medications Ordered in ED Medications  fentaNYL (SUBLIMAZE) injection 50 mcg (50 mcg Intravenous Given 10/16/18 0335)  fentaNYL (SUBLIMAZE) injection 100 mcg (100 mcg Intravenous Given 10/16/18 0424)  oxyCODONE-acetaminophen (PERCOCET/ROXICET) 5-325 MG per tablet 1 tablet (1 tablet Oral Given 10/16/18 0525)     Initial Impression / Assessment and  Plan / ED Course  I have reviewed the triage vital signs and the nursing notes.  Pertinent labs & imaging results that were available during my care of the patient were reviewed by me and considered in my medical decision making (see chart for details).     72 year old female who presents for evaluation of right upper extremity pain status post mechanical fall.  She reports she was walking when her foot got caught in a box, causing her to fall forward and landed on her right side.  Did not hit  her head, lose consciousness.  She is not currently on blood thinners.  Patient reports pain to the right upper extremity.  On initial ED arrival, patient is afebrile but slightly tachycardic, likely secondary to pain.  On exam, she is tenderness palpation with overlying soft tissue swelling noted to the distal humerus and elbow and forearm.  Compartments are soft. Patient is neurovascularly intact.  Limited range of motion secondary to pain.  No neck pain.  Concern for fracture versus dislocation.  History/physical exam is not concerning for compartment syndrome.  No indication for head CT as patient did not hit her head, lose consciousness she is not currently on blood thinners.  Do not suspect hip fracture given that patient has been able to ambulate and bear weight since the incident.  Additionally, neuro exam is reassuring.  Initial x-rays ordered at triage.  Will give analgesics and reassess.  X-ray shows a oblique displaced fracture of the distal humerus at the humeral condylar/supracondylar region.  Wrist x-ray negative for any acute bony abnormality.  Discussed patient with Dr. Judd Lien. Will plan to place patient in a long-arm splint for support and stabilization.  She is previously seen Dewaine Conger for evaluation of orthopedic emergencies.  Discussed with patient she will need outpatient orthopedic referral.  Discussed she can either follow-up with Alois Cliche or referred orthopedic doctor.  Will plan for  splint and sling for immobilization.  Patient reports some improvement in pain after analgesics.  Will give additional analgesics in the ED.  Reevaluation after splint placement.  Patient with good distal cap refill and sensation.  Patient reports improvement of pain after analgesics.  We will plan to send her home a short course of pain medication for acute fracture.  Additionally, instructed patient she will need to follow-up with outpatient orthopedics. Patient had ample opportunity for questions and discussion. All patient's questions were answered with full understanding. Strict return precautions discussed. Patient expresses understanding and agreement to plan.   Final Clinical Impressions(s) / ED Diagnoses   Final diagnoses:  Closed fracture of distal end of right humerus, unspecified fracture morphology, initial encounter    ED Discharge Orders         Ordered    oxyCODONE-acetaminophen (PERCOCET/ROXICET) 5-325 MG tablet  Every 8 hours PRN     10/16/18 0512           Maxwell Caul, PA-C 10/16/18 1610    Geoffery Lyons, MD 10/16/18 [phone]

## 2018-10-16 NOTE — ED Notes (Signed)
Called  Ortho Tech @[id number] 

## 2018-10-30 ENCOUNTER — Inpatient Hospital Stay: Payer: Medicare Other | Attending: Family

## 2018-10-30 VITALS — BP 153/57 | HR 88 | Temp 97.7°F | Resp 18

## 2018-10-30 DIAGNOSIS — D508 Other iron deficiency anemias: Secondary | ICD-10-CM

## 2018-10-30 DIAGNOSIS — K909 Intestinal malabsorption, unspecified: Secondary | ICD-10-CM | POA: Insufficient documentation

## 2018-10-30 DIAGNOSIS — D509 Iron deficiency anemia, unspecified: Secondary | ICD-10-CM | POA: Diagnosis not present

## 2018-10-30 MED ORDER — SODIUM CHLORIDE 0.9 % IV SOLN
510.0000 mg | Freq: Once | INTRAVENOUS | Status: AC
  Administered 2018-10-30: 510 mg via INTRAVENOUS
  Filled 2018-10-30: qty 17

## 2018-10-30 MED ORDER — SODIUM CHLORIDE 0.9 % IV SOLN
Freq: Once | INTRAVENOUS | Status: AC
  Administered 2018-10-30: 09:00:00 via INTRAVENOUS
  Filled 2018-10-30: qty 250

## 2018-10-30 NOTE — Patient Instructions (Signed)

## 2018-11-01 ENCOUNTER — Telehealth: Payer: Self-pay | Admitting: Cardiovascular Disease

## 2018-11-01 NOTE — Telephone Encounter (Signed)
Received records from [facility] on 11/14/9, gave records to scheduling for Appt date/time. [place]

## 2018-11-19 ENCOUNTER — Encounter: Payer: Self-pay | Admitting: Cardiovascular Disease

## 2018-11-19 ENCOUNTER — Ambulatory Visit: Payer: Medicare Other | Admitting: Cardiovascular Disease

## 2018-11-19 VITALS — BP 158/60 | HR 116 | Ht 61.0 in | Wt 204.6 lb

## 2018-11-19 DIAGNOSIS — R0602 Shortness of breath: Secondary | ICD-10-CM

## 2018-11-19 DIAGNOSIS — R600 Localized edema: Secondary | ICD-10-CM | POA: Diagnosis not present

## 2018-11-19 DIAGNOSIS — E118 Type 2 diabetes mellitus with unspecified complications: Secondary | ICD-10-CM

## 2018-11-19 DIAGNOSIS — R079 Chest pain, unspecified: Secondary | ICD-10-CM

## 2018-11-19 DIAGNOSIS — E782 Mixed hyperlipidemia: Secondary | ICD-10-CM

## 2018-11-19 DIAGNOSIS — Z79899 Other long term (current) drug therapy: Secondary | ICD-10-CM

## 2018-11-19 DIAGNOSIS — D509 Iron deficiency anemia, unspecified: Secondary | ICD-10-CM

## 2018-11-19 DIAGNOSIS — Z6838 Body mass index (BMI) 38.0-38.9, adult: Secondary | ICD-10-CM

## 2018-11-19 MED ORDER — TORSEMIDE 20 MG PO TABS: 20.0000 mg | ORAL_TABLET | Freq: Every day | ORAL | 3 refills | Status: DC

## 2018-11-19 MED ORDER — ICOSAPENT ETHYL 1 G PO CAPS: 2.0000 g | ORAL_CAPSULE | Freq: Two times a day (BID) | ORAL | 3 refills | Status: AC

## 2018-11-19 MED ORDER — BISOPROLOL FUMARATE 5 MG PO TABS: 5.0000 mg | ORAL_TABLET | Freq: Every day | ORAL | 3 refills | Status: AC

## 2018-11-19 NOTE — Progress Notes (Signed)
Cardiology Office Note    Date:  11/21/2018   ID:  DAVID TOWSON, DOB 1946/11/23, MRN [id number]  PCP:  Dione Housekeeper, MD  Cardiologist:  Shelva Majestic, MD   New cardiology evaluation referred by Dr. Edrick Oh for evaluation of shortness of breath, bilateral lower extremity edema, and episodic chest discomfort.  History of Present Illness:  Angelica [name] is a 72 y.o. female is the widow of my former patient Mr. Angelica [name] who died in 02/11/17.  Angelica [name] is referred by Dr. Edrick Oh  for evaluation of exertional shortness of breath and leg swelling.  Angelica [name] has  history of hypertension, iron deficiency anemia requiring occasional iron infusions, obesity, mixed hyperlipidemia, type 2 diabetes mellitus, and COPD/asthma.  She had experienced a fall earlier this year and developed a closed fracture of the distal end of her right humerus treated by Dr. Amedeo Plenty.  For the past 6 months, she has noticed lower extremity edema which has seemed to increase.  She admits to significant shortness of breath with activity.  She also has experienced 2 weeks ago a spell of significant nonexertional left arm and right chest discomfort.  She has been having to sleep in a recliner since she had broken her arm.  She had recently seen Dr. Dione Housekeeper and with her progressive shortness of breath he was concerned about cardiac etiology and she is referred for cardiology evaluation.  She denies any PND orthopnea.  She denies syncope.  When she had broken her right arm she had tripped leading to the break.  She has been diabetic for 10 years.  She does snore but she believes her sleep is restorative.  She has a history of depression for which she has been on Cymbalta.   Past Medical History:  Diagnosis Date  . Arthritis   . Asthma   . COPD (chronic obstructive pulmonary disease) ([place])   . Diabetes mellitus   . Hypertension     Past Surgical History:  Procedure Laterality Date  . ABDOMINAL  HYSTERECTOMY    . BACK SURGERY    . CESAREAN SECTION    . CHOLECYSTECTOMY    . FOOT SURGERY    . TONSILLECTOMY    . TOTAL KNEE ARTHROPLASTY    . TUBAL LIGATION      Current Medications: Outpatient Medications Prior to Visit  Medication Sig Dispense Refill  . albuterol (PROVENTIL) (2.5 MG/3ML) 0.083% nebulizer solution     . cyclobenzaprine (FLEXERIL) 10 MG tablet Take 10 mg by mouth 3 (three) times daily as needed. For muscle spasms    . diltiazem (CARDIZEM CD) 120 MG 24 hr capsule Take 120 mg by mouth daily.    Marland Kitchen diltiazem (TIAZAC) 120 MG 24 hr capsule Take 120 mg by mouth at bedtime.    . diphenhydramine-acetaminophen (TYLENOL PM) 25-500 MG TABS Take 1 tablet by mouth at bedtime as needed. For sleep    . docusate sodium (COLACE) 100 MG capsule Take 100 mg by mouth at bedtime.    . DULoxetine (CYMBALTA) 60 MG capsule Take 60 mg by mouth daily.    . ergocalciferol (VITAMIN D2) 50000 UNITS capsule Take 50,000 Units by mouth once a week. On Saturdays    . estradiol (ESTRACE) 1 MG tablet Take 1 mg by mouth at bedtime.    . fenofibrate 160 MG tablet Take 160 mg by mouth at bedtime.    . Fluticasone-Salmeterol (ADVAIR DISKUS) 250-50 MCG/DOSE AEPB Inhale 1 puff into the lungs every 12 (  twelve) hours.    Marland Kitchen glipiZIDE (GLUCOTROL XL) 10 MG 24 hr tablet Take 10 mg by mouth daily.    Marland Kitchen ibuprofen (ADVIL,MOTRIN) 200 MG tablet Take 400 mg by mouth every 6 (six) hours as needed. For pain    . losartan (COZAAR) 100 MG tablet     . metFORMIN (GLUMETZA) 500 MG (MOD) 24 hr tablet Take 500 mg by mouth 2 (two) times daily with a meal.    . omeprazole (PRILOSEC) 20 MG capsule Take 20 mg by mouth 2 (two) times daily.    Marland Kitchen oxyCODONE-acetaminophen (PERCOCET/ROXICET) 5-325 MG tablet Take 1-2 tablets by mouth every 8 (eight) hours as needed for severe pain. 12 tablet 0  . sitaGLIPtin (JANUVIA) 100 MG tablet Take 100 mg by mouth daily.    Marland Kitchen tiotropium (SPIRIVA HANDIHALER) 18 MCG inhalation capsule USE 1 PUFF DAILY     . triamterene-hydrochlorothiazide (MAXZIDE-25) 37.5-25 MG per tablet Take 1 tablet by mouth every morning.    . furosemide (LASIX) 20 MG tablet TAKE ONE (1) TABLET EACH DAY     No facility-administered medications prior to visit.      Allergies:   Levaquin [levofloxacin]; Amoxicillin-pot clavulanate; Ceftriaxone; Morphine and related; Quinine; Rocephin [ceftriaxone sodium in dextrose]; and Quinine derivatives   Social History   Socioeconomic History  . Marital status: Married    Spouse name: Not on file  . Number of children: Not on file  . Years of education: Not on file  . Highest education level: Not on file  Occupational History  . Not on file  Social Needs  . Financial resource strain: Not on file  . Food insecurity:    Worry: Not on file    Inability: Not on file  . Transportation needs:    Medical: Not on file    Non-medical: Not on file  Tobacco Use  . Smoking status: Former Smoker    Packs/day: 1.00    Years: 30.00    Pack years: 30.00    Types: Cigarettes  . Smokeless tobacco: Never Used  Substance and Sexual Activity  . Alcohol use: No  . Drug use: No  . Sexual activity: Never  Lifestyle  . Physical activity:    Days per week: Not on file    Minutes per session: Not on file  . Stress: Not on file  Relationships  . Social connections:    Talks on phone: Not on file    Gets together: Not on file    Attends religious service: Not on file    Active member of club or organization: Not on file    Attends meetings of clubs or organizations: Not on file    Relationship status: Not on file  Other Topics Concern  . Not on file  Social History Narrative  . Not on file     Additional social history is notable that she is widowed for 2 years.  She has 1 child and 2 grandchildren.  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  She has smoked occasional E cigarettes for 5 years.  She does not drink alcohol.  She does  not exercise.  Family History:  The patient's family history includes Lung cancer in her father; Lupus in her mother; Pneumonia in her mother.  Her mother died at age 74 with heart problems.  Her father died at age 77 with heart problems.  A maternal grandmother had a stroke.  She has 2 living sisters.  ROS General: Negative; No fevers, chills, or night sweats; obesity HEENT: Negative; No changes in vision or hearing, sinus congestion, difficulty swallowing Pulmonary: COPD/asthma, shortness of breath with activity Cardiovascular: See HPI GI: Negative; No nausea, vomiting, diarrhea, or abdominal pain GU: Negative; No dysuria, hematuria, or difficulty voiding Musculoskeletal: Right humerus fracture Hematologic/Oncology: Negative; no easy bruising, bleeding Endocrine: Type 2 diabetes mellitus Neuro: Negative; no changes in balance, headaches Skin: Negative; No rashes or skin lesions Psychiatric: Positive for depression Sleep: Snoring; no daytime sleepiness, hypersomnolence, bruxism, restless legs, hypnogognic hallucinations, no cataplexy Other comprehensive 14 point system review is negative.   PHYSICAL EXAM:   VS:  BP (!) 158/60   Pulse (!) 116   Ht '5\' 1"'  (1.549 m)   Wt 204 lb 9.6 oz (92.8 kg)   BMI 38.66 kg/m     Repeat blood pressure by me was 156/64  Wt Readings from Last 3 Encounters:  11/19/18 204 lb 9.6 oz (92.8 kg)  10/16/18 204 lb (92.5 kg)  09/25/18 208 lb (94.3 kg)    General: Alert, oriented, no distress.  Obese Skin: normal turgor, no rashes, warm and dry HEENT: Normocephalic, atraumatic. Pupils equal round and reactive to light; sclera anicteric; extraocular muscles intact; Fundi mild arteriolar narrowing.  No hemorrhages or exudates. Nose without nasal septal hypertrophy Mouth/Parynx benign; Mallinpatti scale 3 Neck: No JVD, no carotid bruits; normal carotid upstroke Lungs: clear to ausculatation and percussion; no wheezing or rales Chest wall: without  tenderness to palpitation Heart: PMI not displaced, tachycardic with heart rate around 110 bpm, s1 s2 normal, 1/6 systolic murmur, no diastolic murmur, no rubs, gallops, thrills, or heaves Abdomen: Central adiposity soft, nontender; no hepatosplenomehaly, BS+; abdominal aorta nontender and not dilated by palpation. Back: no CVA tenderness Pulses 2+ Musculoskeletal: full range of motion, normal strength, no joint deformities Extremities: 3+ pitting edema bilateral lower extremities; right arm in splint;  Homan's sign negative  Neurologic: grossly nonfocal; Cranial nerves grossly wnl Psychologic: Normal mood and affect   Studies/Labs Reviewed:   EKG:  EKG  Is ordered today.  ECG (independently read by me): Sinus tachycardia at 116 bpm.  Right bundle branch block with repolarization changes, left anterior hemiblock.  QTc interval 514 Angelica.  Recent Labs: BMP Latest Ref Rng & Units 10/06/2017 12/19/2014 03/22/2012  Glucose 73 - 118 mg/dL 228(H) 169(H) 130(H)  BUN 7 - 22 mg/dL 16 22 26(H)  Creatinine 0.6 - 1.2 mg/dl 0.7 0.97 0.58  Sodium 128 - 145 mEq/L 140 131(L) 136  Potassium 3.3 - 4.7 mEq/L 3.8 4.5 4.1  Chloride 98 - 108 mEq/L 99 95(L) 94(L)  CO2 18 - 33 mEq/L 29 23 36(H)  Calcium 8.0 - 10.3 mg/dL 9.7 9.1 9.9     Hepatic Function Latest Ref Rng & Units 10/06/2017 03/19/2012 03/18/2012  Total Protein 6.4 - 8.1 g/dL 7.5 6.9 6.7  Albumin 3.3 - 5.5 g/dL 3.5 3.2(L) 3.2(L)  AST 11 - 38 U/L '21 12 13  ' ALT 10 - 47 U/L '20 15 16  ' Alk Phosphatase 26 - 84 U/L 56 52 55  Total Bilirubin 0.20 - 1.60 mg/dl 0.40 0.4 0.4    CBC Latest Ref Rng & Units 09/25/2018 07/18/2018 05/15/2018  WBC 3.9 - 10.0 K/uL 10.2(H) 8.7 7.0  Hemoglobin 11.6 - 15.9 g/dL 9.2(L) 10.7(L) 10.3(L)  Hematocrit 34.8 - 46.6 % 29.1(L) 33.7(L) 32.0(L)  Platelets 145 - 400 K/uL 562(H) 474(H) 513(H)   Lab Results  Component Value Date   MCV 99.3 09/25/2018  MCV 99.4 07/18/2018   MCV 99.4 05/15/2018   Lab Results  Component Value  Date   TSH 1.279 03/02/2012   Lab Results  Component Value Date   HGBA1C 6.4 (H) 03/02/2012     BNP No results found for: BNP  ProBNP    Component Value Date/Time   PROBNP 230.6 (H) 03/20/2012 0523     Lipid Panel  No results found for: CHOL, TRIG, HDL, CHOLHDL, VLDL, LDLCALC, LDLDIRECT   RADIOLOGY: No results found.   Additional studies/ records that were reviewed today include:  I reviewed the records from Dr. Dione Housekeeper.  Recent laboratory from September 28, 2018: Cholesterol 149, triglycerides 334, HDL 40, VLDL 67, LDL 42. Hemoglobin A1c 6.5 BUN 25 creatinine 0.76 Normal LFTs Hemoglobin 9.3 hematocrit 28.6.    ASSESSMENT:    1. SOB (shortness of breath)   2. Chest pain, unspecified type   3. Bilateral leg edema   4. Mixed hyperlipidemia   5. Medication management   6. Iron deficiency anemia, unspecified iron deficiency anemia type   7. Type 2 diabetes mellitus with complication, without long-term current use of insulin (HCC)   8. Class 2 severe obesity due to excess calories with serious comorbidity and body mass index (BMI) of 38.0 to 38.9 in adult [facility])     PLAN:  Angelica [name] is a 72 year old female who has a history of moderate obesity with a BMI of 38.66, hypertension, mixed hyperlipidemia, type 2 diabetes mellitus, COPD/asthma, GERD, and depression.  For the past 6 months she has noticed progressive increase in lower extremity edema which now is 3+ and pitting to below her knees.  Her blood pressure today is elevated and on repeat by me was 156/64 despite taking diltiazem 120 mg, losartan 100 mg and she has a prescription for Maxide and has previously taken Lasix 20 mg but occasionally 40 mg.  With her significant lower extremity edema, I am recommending she discontinue her Lasix and she will start torsemide 40 mg for 2 days and then 20 mg daily.  She is tachycardic with heart rate at 116 bpm.  With her COPD/asthma I am starting her on bisoprolol 5  mg daily for cardioselective beta-blockade.  I am scheduling her for an echo Doppler study to assess systolic and diastolic function.  In order to assess potential ischemia mediated dyspnea I am also scheduling her for a [place] study particularly with her strong family history for heart disease.  I reviewed her most recent laboratory from Dr. Ebony Cargo office.  I discussed with her the "reduce it "trial and have recommended institution of the Vascepa 2 capsules twice a day.  With her significant lower extremity edema I am also scheduling her for lower extremity venous Doppler studies to assess for DVT as well as for venous insufficiency.  She will be following up with Dr. Edrick Oh.  She is diabetic on glipizide, and Januvia.  With her diabetes mellitus, if there are signs of heart failure she may be a candidate for Jardiance which has been shown to have significant cardiovascular benefit.  Presently, there is no wheezing on her current regimen of Advair in addition to Spiriva.  I will see her in 6 weeks for follow-up cardiology evaluation.   Medication Adjustments/Labs and Tests Ordered: Current medicines are reviewed at length with the patient today.  Concerns regarding medicines are outlined above.  Medication changes, Labs and Tests ordered today are listed in the Patient Instructions below. Patient Instructions  Medication  Instructions:  STOP furosemide  START torsemide 20 mg --take 40 mg (2 tablets) daily x 2 days, then resume 20 mg (1 tablet) daily  START Vascepa 2 capsules two times daily  START Bisoprolol 5 mg daily  If you need a refill on your cardiac medications before your next appointment, please call your pharmacy.   Lab work: Please return for labs in 1 week (BMET, CBC)  Our in office lab hours are Monday-Friday 8:00-4:00, closed for lunch 12:45-1:45 pm.  No appointment needed.  If you have labs (blood work) drawn today and your tests are completely normal, you  will receive your results only by: Marland Kitchen MyChart Message (if you have MyChart) OR . A paper copy in the mail If you have any lab test that is abnormal or we need to change your treatment, we will call you to review the results.  Testing/Procedures: Your physician has requested that you have an echocardiogram. Echocardiography is a painless test that uses sound waves to create images of your heart. It provides your doctor with information about the size and shape of your heart and how well your heart's chambers and valves are working. This procedure takes approximately one hour. There are no restrictions for this procedure.  This will be done at our [facility] location:  [place] has requested that you have a lower extremity venous duplex. This test is an ultrasound of the veins in the legs. It looks at venous blood flow that carries blood from the heart to the legs. Allow one hour for a Lower Venous exam. There are no restrictions or special instructions.  Your physician has requested that you have a lexiscan myoview. For further information please visit [email or web address]. Please follow instruction sheet, as given.  Follow-Up: At [facility], you and your health needs are our priority.  As part of our continuing mission to provide you with exceptional heart care, we have created designated Provider Care Teams.  These Care Teams include your primary Cardiologist (physician) and Advanced Practice Providers (APPs -  Physician Assistants and Nurse Practitioners) who all work together to provide you with the care you need, when you need it. You will need a follow up appointment in 6-8 weeks.  Please call our office 2 months in advance to schedule this appointment.  You may see Dr. Claiborne Billings or one of the following Advanced Practice Providers on your designated Care Team: [place], [place] . Fabian Sharp, PA-C       Signed, Shelva Majestic, MD  11/21/2018 9:07 PM      [facility] [address] [place], [place], [place]  [address] Phone: [phone]

## 2018-11-19 NOTE — Patient Instructions (Signed)
Medication Instructions:  STOP furosemide  START torsemide 20 mg --take 40 mg (2 tablets) daily x 2 days, then resume 20 mg (1 tablet) daily  START Vascepa 2 capsules two times daily  START Bisoprolol 5 mg daily  If you need a refill on your cardiac medications before your next appointment, please call your pharmacy.   Lab work: Please return for labs in 1 week (BMET, CBC)  Our in office lab hours are Monday-Friday 8:00-4:00, closed for lunch 12:45-1:45 pm.  No appointment needed.  If you have labs (blood work) drawn today and your tests are completely normal, you will receive your results only by: Marland Kitchen. MyChart Message (if you have MyChart) OR . A paper copy in the mail If you have any lab test that is abnormal or we need to change your treatment, we will call you to review the results.  Testing/Procedures: Your physician has requested that you have an echocardiogram. Echocardiography is a painless test that uses sound waves to create images of your heart. It provides your doctor with information about the size and shape of your heart and how well your heart's chambers and valves are working. This procedure takes approximately one hour. There are no restrictions for this procedure.  [facility]:  [address]  Your physician has requested that you have a lower extremity venous duplex. This test is an ultrasound of the veins in the legs. It looks at venous blood flow that carries blood from the heart to the legs. Allow one hour for a Lower Venous exam. There are no restrictions or special instructions.  Your physician has requested that you have a lexiscan myoview. For further information please visit [email or web address]. Please follow instruction sheet, as given.  Follow-Up: At [facility], you and your health needs are our priority.  As part of our continuing mission to provide you with exceptional heart care, we have created  designated Provider Care Teams.  These Care Teams include your primary Cardiologist (physician) and Advanced Practice Providers (APPs -  Physician Assistants and Nurse Practitioners) who all work together to provide you with the care you need, when you need it. You will need a follow up appointment in 6-8 weeks.  Please call our office 2 months in advance to schedule this appointment.  You may see Dr. Tresa EndoKelly or one of the following Advanced Practice Providers on your designated Care Team: [place] M[place], [place] . Micah FlesherAngela Duke, PA-C

## 2018-11-21 ENCOUNTER — Encounter: Payer: Self-pay | Admitting: Cardiovascular Disease

## 2018-11-27 ENCOUNTER — Other Ambulatory Visit: Payer: Self-pay

## 2018-11-27 ENCOUNTER — Encounter: Payer: Self-pay | Admitting: Family

## 2018-11-27 ENCOUNTER — Inpatient Hospital Stay: Payer: Medicare Other

## 2018-11-27 ENCOUNTER — Inpatient Hospital Stay: Payer: Medicare Other | Attending: Family | Admitting: Family

## 2018-11-27 VITALS — BP 144/64 | HR 98 | Temp 97.7°F | Resp 16 | Wt 198.8 lb

## 2018-11-27 DIAGNOSIS — K909 Intestinal malabsorption, unspecified: Secondary | ICD-10-CM | POA: Diagnosis not present

## 2018-11-27 DIAGNOSIS — D508 Other iron deficiency anemias: Secondary | ICD-10-CM | POA: Insufficient documentation

## 2018-11-27 DIAGNOSIS — D631 Anemia in chronic kidney disease: Secondary | ICD-10-CM

## 2018-11-27 LAB — CBC WITH DIFFERENTIAL (CANCER CENTER ONLY)
Abs Immature Granulocytes: 0.02 10*3/uL (ref 0.00–0.07)
BASOS ABS: 0.1 10*3/uL (ref 0.0–0.1)
BASOS PCT: 1 %
EOS PCT: 2 %
Eosinophils Absolute: 0.2 10*3/uL (ref 0.0–0.5)
HCT: 31.2 % — ABNORMAL LOW (ref 36.0–46.0)
Hemoglobin: 9.5 g/dL — ABNORMAL LOW (ref 12.0–15.0)
Immature Granulocytes: 0 %
Lymphocytes Relative: 26 %
Lymphs Abs: 2 10*3/uL (ref 0.7–4.0)
MCH: 30.5 pg (ref 26.0–34.0)
MCHC: 30.4 g/dL (ref 30.0–36.0)
MCV: 100.3 fL — ABNORMAL HIGH (ref 80.0–100.0)
MONO ABS: 0.5 10*3/uL (ref 0.1–1.0)
Monocytes Relative: 7 %
NRBC: 0 % (ref 0.0–0.2)
Neutro Abs: 4.9 10*3/uL (ref 1.7–7.7)
Neutrophils Relative %: 64 %
PLATELETS: 541 10*3/uL — AB (ref 150–400)
RBC: 3.11 MIL/uL — AB (ref 3.87–5.11)
RDW: 14.4 % (ref 11.5–15.5)
WBC: 7.6 10*3/uL (ref 4.0–10.5)

## 2018-11-27 LAB — FERRITIN: Ferritin: 109 ng/mL (ref 11–307)

## 2018-11-27 LAB — IRON AND TIBC
IRON: 46 ug/dL (ref 41–142)
Saturation Ratios: 11 % — ABNORMAL LOW (ref 21–57)
TIBC: 405 ug/dL (ref 236–444)
UIBC: 359 ug/dL (ref 120–384)

## 2018-11-27 LAB — RETICULOCYTES
Immature Retic Fract: 10.2 % (ref 2.3–15.9)
RBC.: 3.11 MIL/uL — ABNORMAL LOW (ref 3.87–5.11)
RETIC COUNT ABSOLUTE: 71.8 10*3/uL (ref 19.0–186.0)
Retic Ct Pct: 2.3 % (ref 0.4–3.1)

## 2018-11-27 NOTE — Progress Notes (Signed)
Hematology and Oncology Follow Up Visit  ELIANY MCCARTER [id number] 02-15-1946 72 y.o. 11/27/2018   Principle Diagnosis:  Iron deficiency anemia  Current Therapy:   IV iron as indicated- last received inAugust2019   Interim History: Ms. Dura is here today with her care giver Jimmey Ralph for follow-up. Unfortunately she tripped and fell in late October and ended up with a right oblique displaced distal humeral fracture. She states that they did not due surgery and she is currently in a brace.  She is feeling fatigued and has noted some SOB and palpitations with exertion. She was able to see her cardiologist and was started on Zebeta and changed from lasix to torsemide.  No fever, chills, n/v, cough, rash, dizziness, SOB, chest pain, palpitations, abdominal pain or changes in bowel or bladder habits.  She takes a stool softener as needed for constipation.  She has thin skin and does bruise easily but not in excess. No episodes of bleeding, no petechiae.  No numbness or tingling in her extremities. The swelling in her lower extremities is stable. Pedal pulses are 2+.  No lymphadenopathy noted on exam.  She has maintained a good appetite and is staying well hydrated. Her weight is stable.   ECOG Performance Status: 1 - Symptomatic but completely ambulatory  Medications:  Allergies as of 11/27/2018      Reactions   Levaquin [levofloxacin] Itching   Amoxicillin-pot Clavulanate Other (See Comments)   Ceftriaxone Rash   Morphine And Related Rash   Quinine Other (See Comments)   Rocephin [ceftriaxone Sodium In Dextrose] Rash, Other (See Comments)   Quinine Derivatives    Decreased platelet counts      Medication List        Accurate as of 11/27/18  9:46 AM. Always use your most recent med list.          ADVAIR DISKUS 250-50 MCG/DOSE Aepb Generic drug:  Fluticasone-Salmeterol Inhale 1 puff into the lungs every 12 (twelve) hours.   albuterol (2.5 MG/3ML) 0.083% nebulizer  solution Commonly known as:  PROVENTIL   bisoprolol 5 MG tablet Commonly known as:  ZEBETA Take 1 tablet (5 mg total) by mouth daily.   cyclobenzaprine 10 MG tablet Commonly known as:  FLEXERIL Take 10 mg by mouth 3 (three) times daily as needed. For muscle spasms   diltiazem 120 MG 24 hr capsule Commonly known as:  TIAZAC Take 120 mg by mouth at bedtime.   diltiazem 120 MG 24 hr capsule Commonly known as:  CARDIZEM CD Take 120 mg by mouth daily.   diphenhydramine-acetaminophen 25-500 MG Tabs tablet Commonly known as:  TYLENOL PM Take 1 tablet by mouth at bedtime as needed. For sleep   docusate sodium 100 MG capsule Commonly known as:  COLACE Take 100 mg by mouth at bedtime.   DULoxetine 60 MG capsule Commonly known as:  CYMBALTA Take 60 mg by mouth daily.   ergocalciferol 1.25 MG (50000 UT) capsule Commonly known as:  VITAMIN D2 Take 50,000 Units by mouth once a week. On Saturdays   estradiol 1 MG tablet Commonly known as:  ESTRACE Take 1 mg by mouth at bedtime.   fenofibrate 160 MG tablet Take 160 mg by mouth at bedtime.   glipiZIDE 10 MG 24 hr tablet Commonly known as:  GLUCOTROL XL Take 10 mg by mouth daily.   ibuprofen 200 MG tablet Commonly known as:  ADVIL,MOTRIN Take 400 mg by mouth every 6 (six) hours as needed. For pain   Icosapent Ethyl  1 g Caps Take 2 capsules (2 g total) by mouth 2 (two) times daily.   losartan 100 MG tablet Commonly known as:  COZAAR   metFORMIN 500 MG (MOD) 24 hr tablet Commonly known as:  GLUMETZA Take 500 mg by mouth 2 (two) times daily with a meal.   omeprazole 20 MG capsule Commonly known as:  PRILOSEC Take 20 mg by mouth 2 (two) times daily.   oxyCODONE-acetaminophen 5-325 MG tablet Commonly known as:  PERCOCET/ROXICET Take 1-2 tablets by mouth every 8 (eight) hours as needed for severe pain.   sitaGLIPtin 100 MG tablet Commonly known as:  JANUVIA Take 100 mg by mouth daily.   SPIRIVA HANDIHALER 18 MCG  inhalation capsule Generic drug:  tiotropium USE 1 PUFF DAILY   torsemide 20 MG tablet Commonly known as:  DEMADEX Take 1 tablet (20 mg total) by mouth daily.   triamterene-hydrochlorothiazide 37.5-25 MG tablet Commonly known as:  MAXZIDE-25 Take 1 tablet by mouth every morning.       Allergies:  Allergies  Allergen Reactions  . Levaquin [Levofloxacin] Itching  . Amoxicillin-Pot Clavulanate Other (See Comments)  . Ceftriaxone Rash  . Morphine And Related Rash  . Quinine Other (See Comments)  . Rocephin [Ceftriaxone Sodium In Dextrose] Rash and Other (See Comments)  . Quinine Derivatives     Decreased platelet counts    Past Medical History, Surgical history, Social history, and Family History were reviewed and updated.  Review of Systems: All other 10 point review of systems is negative.   Physical Exam:  vitals were not taken for this visit.   Wt Readings from Last 3 Encounters:  11/19/18 204 lb 9.6 oz (92.8 kg)  10/16/18 204 lb (92.5 kg)  09/25/18 208 lb (94.3 kg)    Ocular: Sclerae unicteric, pupils equal, round and reactive to light Ear-nose-throat: Oropharynx clear, dentition fair Lymphatic: No cervical, supraclavicular or axillary adenopathy Lungs no rales or rhonchi, good excursion bilaterally Heart regular rate and rhythm, no murmur appreciated Abd soft, nontender, positive bowel sounds, no liver or spleen tip palpated on exam, no fluid wave  MSK no focal spinal tenderness, no joint edema Neuro: non-focal, well-oriented, appropriate affect Breasts: Deferred   Lab Results  Component Value Date   WBC 10.2 (H) 09/25/2018   HGB 9.2 (L) 09/25/2018   HCT 29.1 (L) 09/25/2018   MCV 99.3 09/25/2018   PLT 562 (H) 09/25/2018   Lab Results  Component Value Date   FERRITIN 81 09/25/2018   IRON 16 (L) 09/25/2018   TIBC 348 09/25/2018   UIBC 332 09/25/2018   IRONPCTSAT 5 (L) 09/25/2018   Lab Results  Component Value Date   RETICCTPCT 2.7 09/25/2018    RBC 2.93 (L) 09/25/2018   No results found for: KPAFRELGTCHN, LAMBDASER, KAPLAMBRATIO No results found for: IGGSERUM, IGA, IGMSERUM No results found for: Marda Stalker, SPEI   Chemistry      Component Value Date/Time   NA 140 10/06/2017 1444   K 3.8 10/06/2017 1444   CL 99 10/06/2017 1444   CO2 29 10/06/2017 1444   BUN 16 10/06/2017 1444   CREATININE 0.7 10/06/2017 1444      Component Value Date/Time   CALCIUM 9.7 10/06/2017 1444   ALKPHOS 56 10/06/2017 1444   AST 21 10/06/2017 1444   ALT 20 10/06/2017 1444   BILITOT 0.40 10/06/2017 1444       Impression and Plan: Ms. Vahey is a very pleasant 72 yo caucasian  female with iron deficiency anemia secondary to malabsorption. She is doing well and recuperating from her fall and broken right arm.  She has noted some fatigue. We will see what her iron studies show and bring her back in for infusion if needed.  We will go ahead and plan to see her back in another 6 weeks for follow-up.  She will contact our office with any questions or concerns. We can certainly see her sooner if need be.   Emeline GinsSarah Vonte Rossin, NP 12/10/20199:46 AM

## 2018-11-28 ENCOUNTER — Telehealth: Payer: Self-pay | Admitting: Hematology & Oncology

## 2018-11-28 LAB — ERYTHROPOIETIN: Erythropoietin: 12.8 m[IU]/mL (ref 2.6–18.5)

## 2018-11-28 NOTE — Telephone Encounter (Signed)
Appointment scheduled patient notified per 12/11 sch msg

## 2018-11-30 ENCOUNTER — Inpatient Hospital Stay: Payer: Medicare Other

## 2018-11-30 VITALS — BP 134/53 | HR 76 | Temp 92.0°F | Resp 16

## 2018-11-30 DIAGNOSIS — D508 Other iron deficiency anemias: Secondary | ICD-10-CM

## 2018-11-30 LAB — BASIC METABOLIC PANEL
BUN/Creatinine Ratio: 33 — ABNORMAL HIGH (ref 12–28)
BUN: 39 mg/dL — ABNORMAL HIGH (ref 8–27)
CO2: 27 mmol/L (ref 20–29)
Calcium: 10.3 mg/dL (ref 8.7–10.3)
Chloride: 89 mmol/L — ABNORMAL LOW (ref 96–106)
Creatinine, Ser: 1.19 mg/dL — ABNORMAL HIGH (ref 0.57–1.00)
GFR, EST AFRICAN AMERICAN: 53 mL/min/{1.73_m2} — AB (ref >59)
GFR, EST NON AFRICAN AMERICAN: 46 mL/min/{1.73_m2} — AB (ref >59)
Glucose: 137 mg/dL — ABNORMAL HIGH (ref 65–99)
POTASSIUM: 4.3 mmol/L (ref 3.5–5.2)
SODIUM: 139 mmol/L (ref 134–144)

## 2018-11-30 LAB — CBC
Hematocrit: 32.3 % — ABNORMAL LOW (ref 34.0–46.6)
Hemoglobin: 10.4 g/dL — ABNORMAL LOW (ref 11.1–15.9)
MCH: 30.4 pg (ref 26.6–33.0)
MCHC: 32.2 g/dL (ref 31.5–35.7)
MCV: 94 fL (ref 79–97)
PLATELETS: 662 10*3/uL — AB (ref 150–450)
RBC: 3.42 x10E6/uL — ABNORMAL LOW (ref 3.77–5.28)
RDW: 13.8 % (ref 12.3–15.4)
WBC: 9.3 10*3/uL (ref 3.4–10.8)

## 2018-11-30 MED ORDER — SODIUM CHLORIDE 0.9 % IV SOLN
INTRAVENOUS | Status: DC
  Administered 2018-11-30: 11:00:00 via INTRAVENOUS
  Filled 2018-11-30: qty 250

## 2018-11-30 MED ORDER — SODIUM CHLORIDE 0.9 % IV SOLN
510.0000 mg | Freq: Once | INTRAVENOUS | Status: AC
  Administered 2018-11-30: 510 mg via INTRAVENOUS
  Filled 2018-11-30: qty 17

## 2018-11-30 NOTE — Patient Instructions (Signed)

## 2018-12-03 ENCOUNTER — Other Ambulatory Visit: Payer: Self-pay

## 2018-12-03 ENCOUNTER — Ambulatory Visit (HOSPITAL_COMMUNITY): Payer: Medicare Other | Attending: Cardiology

## 2018-12-03 DIAGNOSIS — R079 Chest pain, unspecified: Secondary | ICD-10-CM | POA: Diagnosis not present

## 2018-12-03 DIAGNOSIS — R0602 Shortness of breath: Secondary | ICD-10-CM

## 2018-12-11 ENCOUNTER — Other Ambulatory Visit: Payer: Self-pay | Admitting: *Deleted

## 2018-12-11 DIAGNOSIS — R7989 Other specified abnormal findings of blood chemistry: Secondary | ICD-10-CM

## 2018-12-11 DIAGNOSIS — Z79899 Other long term (current) drug therapy: Secondary | ICD-10-CM

## 2018-12-11 MED ORDER — TORSEMIDE 20 MG PO TABS: 10.0000 mg | ORAL_TABLET | Freq: Every day | ORAL | 3 refills | Status: AC

## 2018-12-13 ENCOUNTER — Telehealth (HOSPITAL_COMMUNITY): Payer: Self-pay

## 2018-12-13 NOTE — Telephone Encounter (Signed)
Encounter complete. 

## 2018-12-18 ENCOUNTER — Ambulatory Visit (HOSPITAL_COMMUNITY): Payer: Medicare Other

## 2018-12-18 ENCOUNTER — Ambulatory Visit (HOSPITAL_COMMUNITY)
Admission: RE | Admit: 2018-12-18 | Payer: Medicare Other | Source: Ambulatory Visit | Attending: Cardiovascular Disease | Admitting: Cardiovascular Disease

## 2019-01-01 ENCOUNTER — Telehealth (HOSPITAL_COMMUNITY): Payer: Self-pay

## 2019-01-01 NOTE — Telephone Encounter (Signed)
Encounter complete. 

## 2019-01-03 ENCOUNTER — Ambulatory Visit: Payer: Medicare Other | Admitting: Cardiovascular Disease

## 2019-01-03 ENCOUNTER — Encounter: Payer: Self-pay | Admitting: Cardiovascular Disease

## 2019-01-03 ENCOUNTER — Ambulatory Visit (HOSPITAL_BASED_OUTPATIENT_CLINIC_OR_DEPARTMENT_OTHER)
Admission: RE | Admit: 2019-01-03 | Discharge: 2019-01-03 | Disposition: A | Discharge status: Home / Self Care | Payer: Medicare Other | Source: Ambulatory Visit | Attending: Cardiovascular Disease | Admitting: Cardiovascular Disease

## 2019-01-03 ENCOUNTER — Ambulatory Visit (HOSPITAL_COMMUNITY)
Admission: RE | Admit: 2019-01-03 | Discharge: 2019-01-03 | Disposition: A | Discharge status: Home / Self Care | Payer: Medicare Other | Source: Ambulatory Visit | Attending: Cardiology | Admitting: Cardiology

## 2019-01-03 VITALS — BP 132/58 | HR 62 | Ht 61.0 in | Wt 196.0 lb

## 2019-01-03 DIAGNOSIS — R079 Chest pain, unspecified: Secondary | ICD-10-CM

## 2019-01-03 DIAGNOSIS — Z01818 Encounter for other preprocedural examination: Secondary | ICD-10-CM

## 2019-01-03 DIAGNOSIS — Z6838 Body mass index (BMI) 38.0-38.9, adult: Secondary | ICD-10-CM

## 2019-01-03 DIAGNOSIS — R0602 Shortness of breath: Secondary | ICD-10-CM | POA: Diagnosis not present

## 2019-01-03 DIAGNOSIS — R600 Localized edema: Secondary | ICD-10-CM

## 2019-01-03 DIAGNOSIS — E118 Type 2 diabetes mellitus with unspecified complications: Secondary | ICD-10-CM

## 2019-01-03 DIAGNOSIS — I10 Essential (primary) hypertension: Secondary | ICD-10-CM | POA: Diagnosis not present

## 2019-01-03 DIAGNOSIS — E782 Mixed hyperlipidemia: Secondary | ICD-10-CM

## 2019-01-03 LAB — MYOCARDIAL PERFUSION IMAGING
LV dias vol: 110 mL (ref 46–106)
LV sys vol: 42 mL
NUC STRESS TID: 1.17
Peak HR: 90 {beats}/min
Rest HR: 86 {beats}/min
SDS: 3
SRS: 3
SSS: 6

## 2019-01-03 MED ORDER — TECHNETIUM TC 99M TETROFOSMIN IV KIT
10.7000 | PACK | Freq: Once | INTRAVENOUS | Status: AC | PRN
  Administered 2019-01-03: 10.7 via INTRAVENOUS
  Filled 2019-01-03: qty 11

## 2019-01-03 MED ORDER — REGADENOSON 0.4 MG/5ML IV SOLN
0.4000 mg | Freq: Once | INTRAVENOUS | Status: AC
  Administered 2019-01-03: 0.4 mg via INTRAVENOUS

## 2019-01-03 MED ORDER — AMINOPHYLLINE 25 MG/ML IV SOLN
75.0000 mg | Freq: Once | INTRAVENOUS | Status: AC
  Administered 2019-01-03: 75 mg via INTRAVENOUS

## 2019-01-03 MED ORDER — TECHNETIUM TC 99M TETROFOSMIN IV KIT
30.2000 | PACK | Freq: Once | INTRAVENOUS | Status: AC | PRN
  Administered 2019-01-03: 30.2 via INTRAVENOUS
  Filled 2019-01-03: qty 31

## 2019-01-03 NOTE — Patient Instructions (Signed)
Medication Instructions:  The current medical regimen is effective;  continue present plan and medications.  If you need a refill on your cardiac medications before your next appointment, please call your pharmacy.    Follow-Up: At [facility], you and your health needs are our priority.  As part of our continuing mission to provide you with exceptional heart care, we have created designated Provider Care Teams.  These Care Teams include your primary Cardiologist (physician) and Advanced Practice Providers (APPs -  Physician Assistants and Nurse Practitioners) who all work together to provide you with the care you need, when you need it. You will need a follow up appointment in 6 months.  Please call our office 2 months in advance to schedule this appointment.  You may see Dr.Kelly or one of the following Advanced Practice Providers on your designated Care Team: Azalee Course, [place] . Micah Flesher, PA-C  Any Other Special Instructions Will Be Listed Below (If Applicable). Will send Clearance over. Cleared for surgery

## 2019-01-03 NOTE — Progress Notes (Signed)
Cardiology Office Note    Date:  01/03/2019   ID:  Angelica [name], DOB Nov 15, 1946, MRN [id number]  PCP:  Dione Housekeeper, MD  Cardiologist:  Shelva Majestic, MD   F/U cardiology evaluation referred by Dr. Edrick Oh for evaluation of shortness of breath, bilateral lower extremity edema, and episodic chest discomfort.  History of Present Illness:  Angelica [name] is a 73 y.o. female is the widow of my former patient [name] who died in 2017-01-28.  Angelica [name] was referred by Dr. Edrick Oh  for evaluation of exertional shortness of breath and leg swelling.  Saw her for initial evaluation on November 19, 2018.  She presents for follow-up evaluation.  Angelica [name] has a history of hypertension, iron deficiency anemia requiring occasional iron infusions, obesity, mixed hyperlipidemia, type 2 diabetes mellitus, and COPD/asthma.  She had experienced a fall earlier this year and developed a closed fracture of the distal end of her right humerus treated by Dr. Amedeo Plenty.  For the past 6 months, she has noticed lower extremity edema which has seemed to increase.  She admits to significant shortness of breath with activity.  She also has experienced 2 weeks ago a spell of significant nonexertional left arm and right chest discomfort.  She has been having to sleep in a recliner since she had broken her arm.  She had recently seen Dr. Dione Housekeeper and with her progressive shortness of breath he was concerned about cardiac etiology and she is referred for cardiology evaluation.  She denies any PND orthopnea.  She denies syncope.  When she had broken her right arm she had tripped leading to the break.  She has been diabetic for 10 years.  She does snore but she believes her sleep is restorative.  She has a history of depression for which she has been on Cymbalta.  When I saw her for her initial evaluation, I recommended that she undergo an echo Doppler study which was done on December 03, 2018 and revealed normal  systolic function with an EF of 60 to 65% with grade 1 diastolic dysfunction and mild LA dilation.  A nuclear perfusion study was done on January 03, 2019 was low risk and showed mild wall defect in the apical inferior wall.  Suspect this may be more artifact due to her body habitus rather than true ischemia.  Nuclear stress EF was 62%.  She underwent lower extremity venous reflux studies January 03, 2019 which did not reveal any evidence for deep vein thrombosis and there was no evidence for obstruction or evidence for reflux in the deep or superficial venous system.    She continues to be followed by Dr. Amedeo Plenty following her right oblique displaced distal humerus fracture.  She will require pinning which tentatively will be scheduled for the beginning of 2023-01-28.  He denies any recent chest pain.  Her breathing has improved.  She denies significant increase in leg swelling.  She presents for preoperative cardiology assessment.   Past Medical History:  Diagnosis Date  . Arthritis   . Asthma   . COPD (chronic obstructive pulmonary disease) ([place])   . Diabetes mellitus   . Hypertension     Past Surgical History:  Procedure Laterality Date  . ABDOMINAL HYSTERECTOMY    . BACK SURGERY    . CESAREAN SECTION    . CHOLECYSTECTOMY    . FOOT SURGERY    . TONSILLECTOMY    . TOTAL KNEE ARTHROPLASTY    . TUBAL LIGATION  Current Medications: Outpatient Medications Prior to Visit  Medication Sig Dispense Refill  . albuterol (PROVENTIL) (2.5 MG/3ML) 0.083% nebulizer solution     . bisoprolol (ZEBETA) 5 MG tablet Take 1 tablet (5 mg total) by mouth daily. 90 tablet 3  . cyclobenzaprine (FLEXERIL) 10 MG tablet Take 10 mg by mouth 3 (three) times daily as needed. For muscle spasms    . diltiazem (CARDIZEM CD) 120 MG 24 hr capsule Take 120 mg by mouth daily.    Marland Kitchen diltiazem (TIAZAC) 120 MG 24 hr capsule Take 120 mg by mouth at bedtime.    . diphenhydramine-acetaminophen (TYLENOL PM) 25-500 MG TABS  Take 1 tablet by mouth at bedtime as needed. For sleep    . docusate sodium (COLACE) 100 MG capsule Take 100 mg by mouth at bedtime.    . DULoxetine (CYMBALTA) 60 MG capsule Take 60 mg by mouth daily.    . ergocalciferol (VITAMIN D2) 50000 UNITS capsule Take 50,000 Units by mouth once a week. On Saturdays    . estradiol (ESTRACE) 1 MG tablet Take 1 mg by mouth at bedtime.    . fenofibrate 160 MG tablet Take 160 mg by mouth at bedtime.    . Fluticasone-Salmeterol (ADVAIR DISKUS) 250-50 MCG/DOSE AEPB Inhale 1 puff into the lungs every 12 (twelve) hours.    Marland Kitchen glipiZIDE (GLUCOTROL XL) 10 MG 24 hr tablet Take 10 mg by mouth daily.    Marland Kitchen ibuprofen (ADVIL,MOTRIN) 200 MG tablet Take 400 mg by mouth every 6 (six) hours as needed. For pain    . Icosapent Ethyl (VASCEPA) 1 g CAPS Take 2 capsules (2 g total) by mouth 2 (two) times daily. 360 capsule 3  . losartan (COZAAR) 100 MG tablet     . metFORMIN (GLUMETZA) 500 MG (MOD) 24 hr tablet Take 500 mg by mouth 2 (two) times daily with a meal.    . omeprazole (PRILOSEC) 20 MG capsule Take 20 mg by mouth 2 (two) times daily.    . sitaGLIPtin (JANUVIA) 100 MG tablet Take 100 mg by mouth daily.    Marland Kitchen tiotropium (SPIRIVA HANDIHALER) 18 MCG inhalation capsule USE 1 PUFF DAILY    . torsemide (DEMADEX) 20 MG tablet Take 0.5 tablets (10 mg total) by mouth daily. 90 tablet 3  . triamterene-hydrochlorothiazide (MAXZIDE-25) 37.5-25 MG per tablet Take 1 tablet by mouth every morning.     No facility-administered medications prior to visit.      Allergies:   Levaquin [levofloxacin]; Amoxicillin-pot clavulanate; Ceftriaxone; Morphine and related; Quinine; Rocephin [ceftriaxone sodium in dextrose]; and Quinine derivatives   Social History   Socioeconomic History  . Marital status: Married    Spouse name: Not on file  . Number of children: Not on file  . Years of education: Not on file  . Highest education level: Not on file  Occupational History  . Not on file    Social Needs  . Financial resource strain: Not on file  . Food insecurity:    Worry: Not on file    Inability: Not on file  . Transportation needs:    Medical: Not on file    Non-medical: Not on file  Tobacco Use  . Smoking status: Former Smoker    Packs/day: 1.00    Years: 30.00    Pack years: 30.00    Types: Cigarettes  . Smokeless tobacco: Never Used  Substance and Sexual Activity  . Alcohol use: No  . Drug use: No  . Sexual activity: Never  Lifestyle  . Physical activity:    Days per week: Not on file    Minutes per session: Not on file  . Stress: Not on file  Relationships  . Social connections:    Talks on phone: Not on file    Gets together: Not on file    Attends religious service: Not on file    Active member of club or organization: Not on file    Attends meetings of clubs or organizations: Not on file    Relationship status: Not on file  Other Topics Concern  . Not on file  Social History Narrative  . Not on file     Additional social history is notable that she is widowed for 2 years.  She has 1 child and 2 grandchildren.  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  She has smoked occasional E cigarettes for 5 years.  She does not drink alcohol.  She does not exercise.  Family History:  The patient's family history includes Lung cancer in her father; Lupus in her mother; Pneumonia in her mother.  Her mother died at age 56 with heart problems.  Her father died at age 29 with heart problems.  A maternal grandmother had a stroke.  She has 2 living sisters.  ROS General: Negative; No fevers, chills, or night sweats; obesity HEENT: Negative; No changes in vision or hearing, sinus congestion, difficulty swallowing Pulmonary: COPD/asthma, shortness of breath with activity Cardiovascular: See HPI GI: Negative; No nausea, vomiting, diarrhea, or abdominal pain GU: Negative; No dysuria, hematuria, or difficulty  voiding Musculoskeletal: Right humerus fracture Hematologic/Oncology: Negative; no easy bruising, bleeding Endocrine: Type 2 diabetes mellitus Neuro: Negative; no changes in balance, headaches Skin: Negative; No rashes or skin lesions Psychiatric: Positive for depression Sleep: Snoring; no daytime sleepiness, hypersomnolence, bruxism, restless legs, hypnogognic hallucinations, no cataplexy Other comprehensive 14 point system review is negative.   PHYSICAL EXAM:   VS:  BP (!) 132/58 (BP Location: Left Arm, Patient Position: Sitting, Cuff Size: Normal)   Pulse 62   Ht '5\' 1"'  (1.549 m)   Wt 196 lb (88.9 kg)   BMI 37.03 kg/m     Repeat blood pressure by me was 130/64  Wt Readings from Last 3 Encounters:  01/03/19 196 lb (88.9 kg)  01/03/19 198 lb (89.8 kg)  11/27/18 198 lb 12.8 oz (90.2 kg)     General: Alert, oriented, no distress.  Obese with BMI of 37 Skin: normal turgor, no rashes, warm and dry HEENT: Normocephalic, atraumatic. Pupils equal round and reactive to light; sclera anicteric; extraocular muscles intact;  Nose without nasal septal hypertrophy Mouth/Parynx benign; Mallinpatti scale 3 Neck: Thick neck; no JVD, no carotid bruits; normal carotid upstroke Lungs: clear to ausculatation and percussion; no wheezing or rales Chest wall: without tenderness to palpitation Heart: PMI not displaced, RRR, s1 s2 normal, 1/6 systolic murmur, no diastolic murmur, no rubs, gallops, thrills, or heaves Abdomen: Moderate central adiposity soft, nontender; no hepatosplenomehaly, BS+; abdominal aorta nontender and not dilated by palpation. Back: no CVA tenderness Pulses 2+ Musculoskeletal: full range of motion, normal strength, no joint deformities Extremities: Right arm in hard slint; previous pitting edema improved, no clubbing cyanosis, Homan's sign negative  Neurologic: grossly nonfocal; Cranial nerves grossly wnl Psychologic: Normal mood and affect   Studies/Labs Reviewed:    EKG:  EKG  Is not ordered today.    11/19/2018 ECG (independently read by me): Sinus tachycardia at 116 bpm.  Right bundle branch block with repolarization changes, left anterior hemiblock.  QTc interval 514 Angelica.  Recent Labs: BMP Latest Ref Rng & Units 11/29/2018 10/06/2017 12/19/2014  Glucose 65 - 99 mg/dL 137(H) 228(H) 169(H)  BUN 8 - 27 mg/dL 39(H) 16 22  Creatinine 0.57 - 1.00 mg/dL 1.19(H) 0.7 0.97  BUN/Creat Ratio 12 - 28 33(H) - -  Sodium 134 - 144 mmol/L 139 140 131(L)  Potassium 3.5 - 5.2 mmol/L 4.3 3.8 4.5  Chloride 96 - 106 mmol/L 89(L) 99 95(L)  CO2 20 - 29 mmol/L '27 29 23  ' Calcium 8.7 - 10.3 mg/dL 10.3 9.7 9.1     Hepatic Function Latest Ref Rng & Units 10/06/2017 03/19/2012 03/18/2012  Total Protein 6.4 - 8.1 g/dL 7.5 6.9 6.7  Albumin 3.3 - 5.5 g/dL 3.5 3.2(L) 3.2(L)  AST 11 - 38 U/L '21 12 13  ' ALT 10 - 47 U/L '20 15 16  ' Alk Phosphatase 26 - 84 U/L 56 52 55  Total Bilirubin 0.20 - 1.60 mg/dl 0.40 0.4 0.4    CBC Latest Ref Rng & Units 11/29/2018 11/27/2018 09/25/2018  WBC 3.4 - 10.8 x10E3/uL 9.3 7.6 10.2(H)  Hemoglobin 11.1 - 15.9 g/dL 10.4(L) 9.5(L) 9.2(L)  Hematocrit 34.0 - 46.6 % 32.3(L) 31.2(L) 29.1(L)  Platelets 150 - 450 x10E3/uL 662(H) 541(H) 562(H)   Lab Results  Component Value Date   MCV 94 11/29/2018   MCV 100.3 (H) 11/27/2018   MCV 99.3 09/25/2018   Lab Results  Component Value Date   TSH 1.279 03/02/2012   Lab Results  Component Value Date   HGBA1C 6.4 (H) 03/02/2012     BNP No results found for: BNP  ProBNP    Component Value Date/Time   PROBNP 230.6 (H) 03/20/2012 0523     Lipid Panel  No results found for: CHOL, TRIG, HDL, CHOLHDL, VLDL, LDLCALC, LDLDIRECT   RADIOLOGY: Vas [place] Lower Extremity Venous Reflux  Result Date: 01/03/2019  Lower Venous Reflux Study Indications: Edema, and SOB.  Risk Factors: None identified. Performing Technologist: Chesley Noon RVT  Examination Guidelines: A complete evaluation includes B-mode  imaging, spectral Doppler, color Doppler, and power Doppler as needed of all accessible portions of each vessel. Bilateral testing is considered an integral part of a complete examination. Limited examinations for reoccurring indications may be performed as noted. The reflux portion of the exam is performed with the patient in reverse Trendelenburg.  Right Venous Findings: +---------+---------------+---------+-----------+----------+-------+          CompressibilityPhasicitySpontaneityPropertiesSummary +---------+---------------+---------+-----------+----------+-------+ CFV      Full           Yes      Yes                          +---------+---------------+---------+-----------+----------+-------+ SFJ      Full           Yes      Yes                          +---------+---------------+---------+-----------+----------+-------+ FV Prox  Full           Yes      Yes                          +---------+---------------+---------+-----------+----------+-------+ FV Mid   Full           Yes      Yes                          +---------+---------------+---------+-----------+----------+-------+  FV DistalFull           Yes      Yes                          +---------+---------------+---------+-----------+----------+-------+ POP      Full           Yes      Yes                          +---------+---------------+---------+-----------+----------+-------+ GSV      Full           Yes      Yes                          +---------+---------------+---------+-----------+----------+-------+ SSV      Full           Yes      Yes                          +---------+---------------+---------+-----------+----------+-------+  Right Technical Findings: Posterior tibial and peroneal veins not visualized well, they appear patent by color Doppler.  Left Venous Findings: +---------+---------------+---------+-----------+----------+-------+           CompressibilityPhasicitySpontaneityPropertiesSummary +---------+---------------+---------+-----------+----------+-------+ CFV      Full           Yes      Yes                          +---------+---------------+---------+-----------+----------+-------+ SFJ      Full           Yes      Yes                          +---------+---------------+---------+-----------+----------+-------+ FV Prox  Full           Yes      Yes                          +---------+---------------+---------+-----------+----------+-------+ FV Mid   Full           Yes      Yes                          +---------+---------------+---------+-----------+----------+-------+ FV Distal               Yes      Yes                          +---------+---------------+---------+-----------+----------+-------+ PTV      Full                                                 +---------+---------------+---------+-----------+----------+-------+ Patient unable to tolerate compression of the left distal femoral vein.  Left Technical Findings: Not visualized segments include peroneal veins.  Venous Reflux Times Normal value < 0.5 sec +--------------+----------+---------+               Right (Angelica)Left (Angelica) +--------------+----------+---------+ GSV prox thigh422.00              +--------------+----------+---------+ GSV prox calf 278.00              +--------------+----------+---------+  SSV prox      172.00              +--------------+----------+---------+ Vein Diameters: +------------------------------+----------+---------+                               Right (cm)Left (cm) +------------------------------+----------+---------+ GSV at Saphenofemoral junction0.46      0.54      +------------------------------+----------+---------+ GSV at prox thigh             0.40      0.40      +------------------------------+----------+---------+ GSV at knee                   0.36      0.39       +------------------------------+----------+---------+ SSV origin                                        +------------------------------+----------+---------+ SSV prox                      0.39      0.32      +------------------------------+----------+---------+   Summary: Right: No evidence of deep vein thrombosis in the lower extremity. No indirect evidence of obstruction proximal to the inguinal ligament. No significant reflux was noted in the deep or superficial venous systems. No cystic structure found in the popliteal fossa. Left: No evidence of deep vein thrombosis in the lower extremity. No indirect evidence of obstruction proximal to the inguinal ligament. No evidence of reflux was noted in the deep or superficial venous systems. No cystic structure found in the popliteal  fossa.  *See table(s) above for measurements and observations.    Preliminary      Additional studies/ records that were reviewed today include:  I reviewed the records from Dr. Dione Housekeeper.  Recent laboratory from September 28, 2018: Cholesterol 149, triglycerides 334, HDL 40, VLDL 67, LDL 42. Hemoglobin A1c 6.5 BUN 25 creatinine 0.76 Normal LFTs Hemoglobin 9.3 hematocrit 28.6.    ASSESSMENT:    1. Preoperative clearance   2. Essential hypertension   3. Bilateral leg edema   4. SOB (shortness of breath)   5. Mixed hyperlipidemia   6. Class 2 severe obesity due to excess calories with serious comorbidity and body mass index (BMI) of 38.0 to 38.9 in adult ([place])   7. Type 2 diabetes mellitus with complication, without long-term current use of insulin [facility])     PLAN:  Angelica [name] is a 73 year old female who has a history of moderate obesity with a BMI of 38.66, hypertension, mixed hyperlipidemia, type 2 diabetes mellitus, COPD/asthma, GERD, and depression.  For the past 6 months she had noticed progressive increase in lower extremity edemawhen I initially saw her she had 3+ pitting edema to her knees.   Her blood pressure was elevated  despite taking diltiazem 120 mg, losartan 100 mg and she has a prescription for Maxide and has previously taken Lasix 20 mg but occasionally 40 mg.  With her significant lower extremity edema, I am recommending she discontinue her Lasix and she hated torsemide 40 mg for 2 days and then 20 mg daily.  She is tachycardic with heart rate at 116 bpm.  With her COPD/asthma I am starting her on bisoprolol 5 mg daily for cardioselective beta-blockade.  I reviewed her echo Doppler  study with her in detail which showed normal systolic function with an EF of 60 to 65% with grade 1 diastolic dysfunction and mild LA dilation.  She was not found to have venous insufficiency on lower extremity venous Doppler assessment.  Her nuclear stress test is low risk and I suspect mild inferoapical thinning is contributed by soft tissue attenuation rather than ischemia.  She is feeling well with the medication adjustments at her last visit.  Her resting pulse today is 62.  Her weight is 196 pounds which is an 8 pound reduction from her initial evaluation.  Blood pressure is is improved now on her medical regimen consisting of diltiazem SR 120 mg, bisoprolol 5 mg daily, losartan 100 mg as well as torsemide and Maxide.  Her previous pitting edema has significantly improved.  She tells me she is now in need to undergo probable orthopedic surgery.  I have given her clearance to undergo this procedure.  She will continue her current medical regimen.  She will be undergoing repeat laboratory by Dr. Edrick Oh next month.  In the past she has had a mixed hyperlipidemic pattern with elevation of triglycerides.  She currently is on fenofibrate as well as the Vascepa 2 capsules twice a day which should be helpful.  She is diabetic on glipizide and metformin in addition to Januvia.  I will see her in 6 months for follow-up evaluation or sooner if problems arise.  Medication Adjustments/Labs and Tests Ordered: Current  medicines are reviewed at length with the patient today.  Concerns regarding medicines are outlined above.  Medication changes, Labs and Tests ordered today are listed in the Patient Instructions below. Patient Instructions  Medication Instructions:  The current medical regimen is effective;  continue present plan and medications.  If you need a refill on your cardiac medications before your next appointment, please call your pharmacy.    Follow-Up: At [facility], you and your health needs are our priority.  As part of our continuing mission to provide you with exceptional heart care, we have created designated Provider Care Teams.  These Care Teams include your primary Cardiologist (physician) and Advanced Practice Providers (APPs -  Physician Assistants and Nurse Practitioners) who all work together to provide you with the care you need, when you need it. You will need a follow up appointment in 6 months.  Please call our office 2 months in advance to schedule this appointment.  You may see Dr.Kelly or one of the following Advanced Practice Providers on your designated Care Team: Almyra Deforest, [place] . Fabian Sharp, PA-C  Any Other Special Instructions Will Be Listed Below (If Applicable). Will send Clearance over. Cleared for surgery       Signed, Shelva Majestic, MD  01/03/2019 7:10 PM    [facility] [address] [place], [place], [place]  [address] Phone: [phone]

## 2019-01-08 ENCOUNTER — Inpatient Hospital Stay: Payer: Medicare Other

## 2019-01-08 ENCOUNTER — Inpatient Hospital Stay: Payer: Medicare Other | Attending: Family | Admitting: Family

## 2019-01-08 ENCOUNTER — Other Ambulatory Visit: Payer: Self-pay

## 2019-01-08 VITALS — BP 156/86 | HR 84 | Temp 98.1°F | Resp 20 | Wt 190.2 lb

## 2019-01-08 DIAGNOSIS — D508 Other iron deficiency anemias: Secondary | ICD-10-CM

## 2019-01-08 DIAGNOSIS — K909 Intestinal malabsorption, unspecified: Secondary | ICD-10-CM | POA: Diagnosis not present

## 2019-01-08 DIAGNOSIS — M21372 Foot drop, left foot: Secondary | ICD-10-CM | POA: Diagnosis not present

## 2019-01-08 DIAGNOSIS — Z79899 Other long term (current) drug therapy: Secondary | ICD-10-CM | POA: Diagnosis not present

## 2019-01-08 DIAGNOSIS — D631 Anemia in chronic kidney disease: Secondary | ICD-10-CM

## 2019-01-08 DIAGNOSIS — D509 Iron deficiency anemia, unspecified: Secondary | ICD-10-CM | POA: Diagnosis not present

## 2019-01-08 LAB — CBC WITH DIFFERENTIAL (CANCER CENTER ONLY)
Abs Immature Granulocytes: 0.03 10*3/uL (ref 0.00–0.07)
Basophils Absolute: 0 10*3/uL (ref 0.0–0.1)
Basophils Relative: 0 %
Eosinophils Absolute: 0.2 10*3/uL (ref 0.0–0.5)
Eosinophils Relative: 3 %
HCT: 30.6 % — ABNORMAL LOW (ref 36.0–46.0)
Hemoglobin: 9.7 g/dL — ABNORMAL LOW (ref 12.0–15.0)
Immature Granulocytes: 0 %
Lymphocytes Relative: 24 %
Lymphs Abs: 2.2 10*3/uL (ref 0.7–4.0)
MCH: 31.1 pg (ref 26.0–34.0)
MCHC: 31.7 g/dL (ref 30.0–36.0)
MCV: 98.1 fL (ref 80.0–100.0)
Monocytes Absolute: 0.6 10*3/uL (ref 0.1–1.0)
Monocytes Relative: 7 %
NEUTROS PCT: 66 %
Neutro Abs: 6.2 10*3/uL (ref 1.7–7.7)
Platelet Count: 576 10*3/uL — ABNORMAL HIGH (ref 150–400)
RBC: 3.12 MIL/uL — ABNORMAL LOW (ref 3.87–5.11)
RDW: 13.8 % (ref 11.5–15.5)
WBC Count: 9.3 10*3/uL (ref 4.0–10.5)
nRBC: 0 % (ref 0.0–0.2)

## 2019-01-08 LAB — IRON AND TIBC
Iron: 53 ug/dL (ref 41–142)
Saturation Ratios: 15 % — ABNORMAL LOW (ref 21–57)
TIBC: 365 ug/dL (ref 236–444)
UIBC: 312 ug/dL (ref 120–384)

## 2019-01-08 LAB — RETICULOCYTES
Immature Retic Fract: 24 % — ABNORMAL HIGH (ref 2.3–15.9)
RBC.: 3.12 MIL/uL — ABNORMAL LOW (ref 3.87–5.11)
Retic Count, Absolute: 76.4 10*3/uL (ref 19.0–186.0)
Retic Ct Pct: 2.5 % (ref 0.4–3.1)

## 2019-01-08 LAB — FERRITIN: Ferritin: 134 ng/mL (ref 11–307)

## 2019-01-08 NOTE — Progress Notes (Signed)
Hematology and Oncology Follow Up Visit  Angelica [name] [id number] 11/03/1946 73 y.o. 01/08/2019   Principle Diagnosis:  Iron deficiency anemia  Current Therapy:   IV iron as indicated   Interim History:  [name] is here today with her caregiver for follow-up. She is symptomatic with fatigue, SOB with over exertion and occasional lightheadedness.  Hgb today is 9.7, MCV 98.  She is waiting for her cardiologist to give clearance for her to have surgery on her right arm. She is hoping to have this in early February.  She is having frequent fall but thankfully has not had any new injuries. She states that she feels that she has "foot drop in the left foot" causing her to stumble and fall. She sees a spinal specialist and states that she will follow up with their office today.  No fever, chills, n/v, cough, rash, chest pain, palpitations or changes in bowel or bladder habits.  She has had some abdominal pain. No constipation or diarrhea. Exam today was negative.  The swelling in her lower extremities is stables. She takes Torsemide daily to help reduce fluid retention.  No lymphadenopathy noted on exam.  She has maintained a good appetite and is hydrating well. Her weight is stable.   ECOG Performance Status: 1 - Symptomatic but completely ambulatory  Medications:  Allergies as of 01/08/2019      Reactions   Levaquin [levofloxacin] Itching   Amoxicillin-pot Clavulanate Other (See Comments)   Ceftriaxone Rash   Morphine And Related Rash   Quinine Other (See Comments)   Rocephin [ceftriaxone Sodium In Dextrose] Rash, Other (See Comments)   Quinine Derivatives    Decreased platelet counts      Medication List       Accurate as of January 08, 2019  9:46 AM. Always use your most recent med list.        ADVAIR DISKUS 250-50 MCG/DOSE Aepb Generic drug:  Fluticasone-Salmeterol Inhale 1 puff into the lungs every 12 (twelve) hours.   albuterol (2.5 MG/3ML) 0.083% nebulizer  solution Commonly known as:  PROVENTIL   bisoprolol 5 MG tablet Commonly known as:  ZEBETA Take 1 tablet (5 mg total) by mouth daily.   cyclobenzaprine 10 MG tablet Commonly known as:  FLEXERIL Take 10 mg by mouth 3 (three) times daily as needed. For muscle spasms   diltiazem 120 MG 24 hr capsule Commonly known as:  TIAZAC Take 120 mg by mouth at bedtime.   diltiazem 120 MG 24 hr capsule Commonly known as:  CARDIZEM CD Take 120 mg by mouth daily.   diphenhydramine-acetaminophen 25-500 MG Tabs tablet Commonly known as:  TYLENOL PM Take 1 tablet by mouth at bedtime as needed. For sleep   docusate sodium 100 MG capsule Commonly known as:  COLACE Take 100 mg by mouth at bedtime.   DULoxetine 60 MG capsule Commonly known as:  CYMBALTA Take 60 mg by mouth daily.   ergocalciferol 1.25 MG (50000 UT) capsule Commonly known as:  VITAMIN D2 Take 50,000 Units by mouth once a week. On Saturdays   estradiol 1 MG tablet Commonly known as:  ESTRACE Take 1 mg by mouth at bedtime.   fenofibrate 160 MG tablet Take 160 mg by mouth at bedtime.   glipiZIDE 10 MG 24 hr tablet Commonly known as:  GLUCOTROL XL Take 10 mg by mouth daily.   ibuprofen 200 MG tablet Commonly known as:  ADVIL,MOTRIN Take 400 mg by mouth every 6 (six) hours as needed. For  pain   Icosapent Ethyl 1 g Caps Commonly known as:  VASCEPA Take 2 capsules (2 g total) by mouth 2 (two) times daily.   losartan 100 MG tablet Commonly known as:  COZAAR   metFORMIN 500 MG (MOD) 24 hr tablet Commonly known as:  GLUMETZA Take 500 mg by mouth 2 (two) times daily with a meal.   omeprazole 20 MG capsule Commonly known as:  PRILOSEC Take 20 mg by mouth 2 (two) times daily.   sitaGLIPtin 100 MG tablet Commonly known as:  JANUVIA Take 100 mg by mouth daily.   SPIRIVA HANDIHALER 18 MCG inhalation capsule Generic drug:  tiotropium USE 1 PUFF DAILY   torsemide 20 MG tablet Commonly known as:  DEMADEX Take 0.5  tablets (10 mg total) by mouth daily.   triamterene-hydrochlorothiazide 37.5-25 MG tablet Commonly known as:  MAXZIDE-25 Take 1 tablet by mouth every morning.       Allergies:  Allergies  Allergen Reactions  . Levaquin [Levofloxacin] Itching  . Amoxicillin-Pot Clavulanate Other (See Comments)  . Ceftriaxone Rash  . Morphine And Related Rash  . Quinine Other (See Comments)  . Rocephin [Ceftriaxone Sodium In Dextrose] Rash and Other (See Comments)  . Quinine Derivatives     Decreased platelet counts    Past Medical History, Surgical history, Social history, and Family History were reviewed and updated.  Review of Systems: All other 10 point review of systems is negative.   Physical Exam:  vitals were not taken for this visit.   Wt Readings from Last 3 Encounters:  01/03/19 196 lb (88.9 kg)  01/03/19 198 lb (89.8 kg)  11/27/18 198 lb 12.8 oz (90.2 kg)    Ocular: Sclerae unicteric, pupils equal, round and reactive to light Ear-nose-throat: Oropharynx clear, dentition fair Lymphatic: No cervical, supraclavicular or axillary adenopathy Lungs no rales or rhonchi, good excursion bilaterally Heart regular rate and rhythm, no murmur appreciated Abd soft, nontender, positive bowel sounds, no liver or spleen tip palpated on exam, no fluid wave  MSK no focal spinal tenderness, no joint edema Neuro: non-focal, well-oriented, appropriate affect Breasts: Deferred   Lab Results  Component Value Date   WBC 9.3 01/08/2019   HGB 9.7 (L) 01/08/2019   HCT 30.6 (L) 01/08/2019   MCV 98.1 01/08/2019   PLT 576 (H) 01/08/2019   Lab Results  Component Value Date   FERRITIN 109 11/27/2018   IRON 46 11/27/2018   TIBC 405 11/27/2018   UIBC 359 11/27/2018   IRONPCTSAT 11 (L) 11/27/2018   Lab Results  Component Value Date   RETICCTPCT 2.5 01/08/2019   RBC 3.12 (L) 01/08/2019   RBC 3.12 (L) 01/08/2019   No results found for: KPAFRELGTCHN, LAMBDASER, KAPLAMBRATIO No results found  for: IGGSERUM, IGA, IGMSERUM No results found for: Marda Stalker, SPEI   Chemistry      Component Value Date/Time   NA 139 11/29/2018 1519   NA 140 10/06/2017 1444   K 4.3 11/29/2018 1519   K 3.8 10/06/2017 1444   CL 89 (L) 11/29/2018 1519   CL 99 10/06/2017 1444   CO2 27 11/29/2018 1519   CO2 29 10/06/2017 1444   BUN 39 (H) 11/29/2018 1519   BUN 16 10/06/2017 1444   CREATININE 1.19 (H) 11/29/2018 1519   CREATININE 0.7 10/06/2017 1444      Component Value Date/Time   CALCIUM 10.3 11/29/2018 1519   CALCIUM 9.7 10/06/2017 1444   ALKPHOS 56 10/06/2017 1444  AST 21 10/06/2017 1444   ALT 20 10/06/2017 1444   BILITOT 0.40 10/06/2017 1444       Impression and Plan: [name] is a very pleasant [age] yo caucasian female with iron deficiency anemia secondary to malabsorption. She is symptomatic at this time with fatigue, SOB with exertion and lightheadedness.  Hgb is 8.8 and MCV 98. We will see what her iron studies show and bring her back in for infusion later this week.  We will plan to see her back in another month.  She will contact our office with any questions or concerns. We can certainly see her sooner if need be.   Emeline GinsSarah Azara Gemme, NP 1/21/20209:46 AM

## 2019-01-09 ENCOUNTER — Telehealth: Payer: Self-pay | Admitting: Family

## 2019-01-09 LAB — ERYTHROPOIETIN: Erythropoietin: 10.7 m[IU]/mL (ref 2.6–18.5)

## 2019-01-09 NOTE — Telephone Encounter (Signed)
Spoke with patient regarding appointment for iron infusion per 1/21 sch msg

## 2019-01-14 ENCOUNTER — Other Ambulatory Visit: Payer: Self-pay

## 2019-01-14 ENCOUNTER — Inpatient Hospital Stay: Payer: Medicare Other

## 2019-01-14 VITALS — BP 156/59 | HR 81 | Temp 98.2°F

## 2019-01-14 DIAGNOSIS — D509 Iron deficiency anemia, unspecified: Secondary | ICD-10-CM | POA: Diagnosis not present

## 2019-01-14 DIAGNOSIS — D508 Other iron deficiency anemias: Secondary | ICD-10-CM

## 2019-01-14 MED ORDER — SODIUM CHLORIDE 0.9 % IV SOLN
Freq: Once | INTRAVENOUS | Status: AC
  Administered 2019-01-14: 13:00:00 via INTRAVENOUS
  Filled 2019-01-14: qty 250

## 2019-01-14 MED ORDER — SODIUM CHLORIDE 0.9 % IV SOLN
510.0000 mg | Freq: Once | INTRAVENOUS | Status: AC
  Administered 2019-01-14: 510 mg via INTRAVENOUS
  Filled 2019-01-14: qty 17

## 2019-01-14 NOTE — Patient Instructions (Signed)

## 2019-01-18 NOTE — Pre-Procedure Instructions (Signed)
Angelica [name]  01/18/2019      [facility] [place] - [address] [place] [id number] Phone: [phone] Fax: [phone]    Your procedure is scheduled on February 7  [facility] at 1230 P.M.  Call this number if you have problems the morning of surgery:  [phone]   Remember:  Do not eat or drink after midnight.      Take these medicines the morning of surgery with A SIP OF WATER  albuterol (PROVENTIL HFA;VENTOLIN HFA)  albuterol (PROVENTIL) (2.5 MG/3ML) bisoprolol (ZEBETA)  cyclobenzaprine (FLEXERIL)  DULoxetine (CYMBALTA) Fluticasone-Salmeterol (ADVAIR DISKUS) omeprazole (PRILOSEC)  tiotropium (SPIRIVA HANDIHALER)  7 days prior to surgery STOP taking any Aspirin (unless otherwise instructed by your surgeon), Aleve, Naproxen, Ibuprofen, Motrin, Advil, Goody's, BC's, all herbal medications, fish oil, and all vitamins.  Bring all inhalers with you the day of surgery   WHAT DO I DO ABOUT MY DIABETES MEDICATION?   Marland Kitchen. Do not take oral diabetes medicines (pills) the morning of surgery. metFORMIN (GLUCOPHAGE), sitaGLIPtin (JANUVIA), glipiZIDE (GLUCOTROL XL)  . THE NIGHT BEFORE SURGERY, DO NOT take  glipiZIDE (GLUCOTROL XL)     How to Manage Your Diabetes Before and After Surgery  Why is it important to control my blood sugar before and after surgery? . Improving blood sugar levels before and after surgery helps healing and can limit problems. . A way of improving blood sugar control is eating a healthy diet by: o  Eating less sugar and carbohydrates o  Increasing activity/exercise o  Talking with your doctor about reaching your blood sugar goals . High blood sugars (greater than 180 mg/dL) can raise your risk of infections and slow your recovery, so you will need to focus on controlling your diabetes during the weeks before surgery. . Make sure that the doctor who takes care of your diabetes  knows about your planned surgery including the date and location.  How do I manage my blood sugar before surgery? . Check your blood sugar at least 4 times a day, starting 2 days before surgery, to make sure that the level is not too high or low. o Check your blood sugar the morning of your surgery when you wake up and every 2 hours until you get to the Short Stay unit. . If your blood sugar is less than 70 mg/dL, you will need to treat for low blood sugar: o Do not take insulin. o Treat a low blood sugar (less than 70 mg/dL) with  cup of clear juice (cranberry or apple), 4 glucose tablets, OR glucose gel. o Recheck blood sugar in 15 minutes after treatment (to make sure it is greater than 70 mg/dL). If your blood sugar is not greater than 70 mg/dL on recheck, call [phone] for further instructions. . Report your blood sugar to the short stay nurse when you get to Short Stay.  . If you are admitted to the hospital after surgery: o Your blood sugar will be checked by the staff and you will probably be given insulin after surgery (instead of oral diabetes medicines) to make sure you have good blood sugar levels. o The goal for blood sugar control after surgery is 80-180 mg/dL.    Do not wear jewelry, make-up or nail polish.  Do not wear lotions, powders, or perfumes, or deodorant.  Do not shave 48 hours prior to surgery.   Do not bring valuables  to the hospital.  [facility] is not responsible for any belongings or valuables.  Contacts, dentures or bridgework may not be worn into surgery.  Leave your suitcase in the car.  After surgery it may be brought to your room.  For patients admitted to the hospital, discharge time will be determined by your treatment team.  Patients discharged the day of surgery will not be allowed to drive home.    Special instructions:   [facility]- Preparing For Surgery  Before surgery, you can play an important role. Because skin is not sterile, your  skin needs to be as free of germs as possible. You can reduce the number of germs on your skin by washing with CHG (chlorahexidine gluconate) Soap before surgery.  CHG is an antiseptic cleaner which kills germs and bonds with the skin to continue killing germs even after washing.    Oral Hygiene is also important to reduce your risk of infection.  Remember - BRUSH YOUR TEETH THE MORNING OF SURGERY WITH YOUR REGULAR TOOTHPASTE  Please do not use if you have an allergy to CHG or antibacterial soaps. If your skin becomes reddened/irritated stop using the CHG.  Do not shave (including legs and underarms) for at least 48 hours prior to first CHG shower. It is OK to shave your face.  Please follow these instructions carefully.   1. Shower the NIGHT BEFORE SURGERY and the MORNING OF SURGERY with CHG.   2. If you chose to wash your hair, wash your hair first as usual with your normal shampoo.  3. After you shampoo, rinse your hair and body thoroughly to remove the shampoo.  4. Use CHG as you would any other liquid soap. You can apply CHG directly to the skin and wash gently with a scrungie or a clean washcloth.   5. Apply the CHG Soap to your body ONLY FROM THE NECK DOWN.  Do not use on open wounds or open sores. Avoid contact with your eyes, ears, mouth and genitals (private parts). Wash Face and genitals (private parts)  with your normal soap.  6. Wash thoroughly, paying special attention to the area where your surgery will be performed.  7. Thoroughly rinse your body with warm water from the neck down.  8. DO NOT shower/wash with your normal soap after using and rinsing off the CHG Soap.  9. Pat yourself dry with a CLEAN TOWEL.  10. Wear CLEAN PAJAMAS to bed the night before surgery, wear comfortable clothes the morning of surgery  11. Place CLEAN SHEETS on your bed the night of your first shower and DO NOT SLEEP WITH PETS.    Day of Surgery:  Do not apply any deodorants/lotions.   Please wear clean clothes to the hospital/surgery center.   Remember to brush your teeth WITH YOUR REGULAR TOOTHPASTE.    Please read over the following fact sheets that you were given.

## 2019-01-21 ENCOUNTER — Other Ambulatory Visit: Payer: Self-pay

## 2019-01-21 ENCOUNTER — Encounter (HOSPITAL_COMMUNITY)
Admission: RE | Admit: 2019-01-21 | Discharge: 2019-01-21 | Disposition: A | Discharge status: Home / Self Care | Payer: Medicare Other | Source: Ambulatory Visit | Attending: Orthopedic Surgery | Admitting: Orthopedic Surgery

## 2019-01-21 ENCOUNTER — Encounter (HOSPITAL_COMMUNITY): Payer: Self-pay

## 2019-01-21 DIAGNOSIS — Z01812 Encounter for preprocedural laboratory examination: Secondary | ICD-10-CM | POA: Insufficient documentation

## 2019-01-21 HISTORY — DX: Atherosclerotic heart disease of native coronary artery without angina pectoris: I25.10

## 2019-01-21 LAB — BASIC METABOLIC PANEL
Anion gap: 14 (ref 5–15)
BUN: 21 mg/dL (ref 8–23)
CO2: 29 mmol/L (ref 22–32)
CREATININE: 0.88 mg/dL (ref 0.44–1.00)
Calcium: 9.5 mg/dL (ref 8.9–10.3)
Chloride: 97 mmol/L — ABNORMAL LOW (ref 98–111)
GFR calc Af Amer: >60 mL/min (ref >60)
GFR calc non Af Amer: >60 mL/min (ref >60)
Glucose, Bld: 114 mg/dL — ABNORMAL HIGH (ref 70–99)
Potassium: 3.7 mmol/L (ref 3.5–5.1)
Sodium: 140 mmol/L (ref 135–145)

## 2019-01-21 LAB — CBC
HCT: 31.9 % — ABNORMAL LOW (ref 36.0–46.0)
Hemoglobin: 9.9 g/dL — ABNORMAL LOW (ref 12.0–15.0)
MCH: 30.7 pg (ref 26.0–34.0)
MCHC: 31 g/dL (ref 30.0–36.0)
MCV: 99.1 fL (ref 80.0–100.0)
PLATELETS: 578 10*3/uL — AB (ref 150–400)
RBC: 3.22 MIL/uL — ABNORMAL LOW (ref 3.87–5.11)
RDW: 14.2 % (ref 11.5–15.5)
WBC: 11.5 10*3/uL — ABNORMAL HIGH (ref 4.0–10.5)
nRBC: 0 % (ref 0.0–0.2)

## 2019-01-21 LAB — HEMOGLOBIN A1C
Hgb A1c MFr Bld: 6.1 % — ABNORMAL HIGH (ref 4.8–5.6)
MEAN PLASMA GLUCOSE: 128.37 mg/dL

## 2019-01-21 NOTE — Progress Notes (Signed)
PCP - Dr. Joette Catching Cardiologist - Dr. Nicki Guadalajara  Chest x-ray - N/A EKG - 01/08/19 Stress Test - 01/03/19 ECHO - 12/03/18 Cardiac Cath - 05/03/11  Sleep Study - has had sleep study in the past. No OSA CPAP - does not use  Fasting Blood Sugar - 90-110 Checks Blood Sugar 2 times a day  Aspirin Instructions: Patient instructed to hold all Aspirin, NSAID's, herbal medications, fish oil and vitamins 7 days prior to surgery.   Anesthesia review: cardiac history  Patient denies shortness of breath, fever, cough and chest pain at PAT appointment   Patient verbalized understanding of instructions that were given to them at the PAT appointment. Patient was also instructed that they will need to review over the PAT instructions again at home before surgery.

## 2019-01-22 ENCOUNTER — Encounter (HOSPITAL_COMMUNITY): Payer: Self-pay

## 2019-01-22 NOTE — Progress Notes (Signed)
Anesthesia Chart Review:  Case:  [id number] Date/Time:  02/08/2019 1416   Procedure:  Right elbow open reduction internal fixation with olecranon osteotomy and ulna nerve decompression (Right ) - 120mins   Anesthesia type:  General   Pre-op diagnosis:  Nonunion right distal humerus fracture   Location:  [facility] ROOM 04 / [facility]   Surgeon:  Dominica SeverinGramig, William, MD      DISCUSSION: Patient is a 73 year old female scheduled for the above procedure.  History includes former smoker, DM2, HTN, COPD, asthma, CAD (mild 2V CAD 2002). BMI is consistent with obesity.   Cardiologist Dr. Tresa EndoKelly has cleared her to proceed with surgery following recent evaluation and testing. (See his 01/03/19 office note for additional details.)  She has chronic anemia. HGB stable at 9.9. Will defer decision to surgeon and/or assigned anesthesiologist if T&S desired on the day of surgery.   Based on currently available information I would anticipate that she can proceed as planned if no acute changes.   VS: BP (!) 165/65   Pulse 84   Temp 36.8 C (Oral)   Resp 19   Ht 5\' 1"  (1.549 m)   Wt 86.6 kg   SpO2 95%   BMI 36.09 kg/m   PROVIDERS: Joette CatchingNyland, Leonard, MD is PCP - Nicki GuadalajaraKelly, Thomas, MD is cardiologist. Seen as new patient for evaluation of SOB, edema, and chest discomfort on 11/19/18. Medications adjusted and stress and echo ordered. Weight was down, edema significantly improved and and stress test felt overall low risk at her 01/03/18 follow-up visit. Six month follow-up planned. - [facility] for iron deficiency anemia (secondary to malabsorption). Last visit 01/08/15 with Emeline Ginsincinnati, Sarah, NP. Plan for iron infusion 01/14/19.   LABS: Preoperative labs noted. H/H 9.9/31.9, but consistent with previous results. She is being followed by hematology and had Ferumoxytol injection 01/14/19. (all labs ordered are listed, but only abnormal results are displayed)  Labs Reviewed  HEMOGLOBIN A1C - Abnormal; Notable for the  following components:      Result Value   Hgb A1c MFr Bld 6.1 (*)    All other components within normal limits  BASIC METABOLIC PANEL - Abnormal; Notable for the following components:   Chloride 97 (*)    Glucose, Bld 114 (*)    All other components within normal limits  CBC - Abnormal; Notable for the following components:   WBC 11.5 (*)    RBC 3.22 (*)    Hemoglobin 9.9 (*)    HCT 31.9 (*)    Platelets 578 (*)    All other components within normal limits   CBC Latest Ref Rng & Units 01/21/2019 01/08/2019 11/29/2018  WBC 4.0 - 10.5 K/uL 11.5(H) 9.3 9.3  Hemoglobin 12.0 - 15.0 g/dL 0.4(V9.9(L) 4.0(J9.7(L) 10.4(L)  Hematocrit 36.0 - 46.0 % 31.9(L) 30.6(L) 32.3(L)  Platelets 150 - 400 K/uL 578(H) 576(H) 662(H)    EKG: 11/19/18 ([facility]): ST at 116, right BBB, LAFB, bifascicular block. (HR 84 bpm at PAT.)   CV: Nuclear stress test 01/03/19:  The left ventricular ejection fraction is normal (55-65%).  Nuclear stress EF: 62%.  There was no ST segment deviation noted during stress.  Defect 1: There is a small defect of mild severity present in the apical inferior location.  This is a low risk study.  Findings consistent with ischemia. Mildly abnormal, low risk stress nuclear study with mild ischemia in the distal inferior wall; EF 62 with normal wall motion.  (Per 01/03/19 office  note by Dr. Tresa EndoKelly, "Her nuclear stress test is low risk and I suspect mild inferoapical thinning is contributed by soft tissue attenuation rather than ischemia.")  BLE venous US/Reflux study 01/03/19: Summary: Right: No evidence of deep vein thrombosis in the lower extremity. No indirect evidence of obstruction proximal to the inguinal ligament. No significant reflux was noted in the deep or superficial venous systems. No cystic structure found in the  popliteal fossa. Left: No evidence of deep vein thrombosis in the lower extremity. No indirect evidence of obstruction proximal to the inguinal ligament. No  evidence of reflux was noted in the deep or superficial venous systems. No cystic structure found in the popliteal fossa.   Echo 12/03/18: Study Conclusions - Left ventricle: The cavity size was mildly dilated. Systolic   function was normal. The estimated ejection fraction was in the   range of 60% to 65%. Wall motion was normal; there were no   regional wall motion abnormalities. Doppler parameters are   consistent with abnormal left ventricular relaxation (grade 1   diastolic dysfunction). Doppler parameters are consistent with   elevated ventricular end-diastolic filling pressure. - Aortic root: The aortic root was normal in size. - Left atrium: The atrium was mildly dilated. - Right ventricle: The cavity size was normal. Wall thickness was   normal. Systolic function was normal. - Tricuspid valve: There was trivial regurgitation. - Pulmonary arteries: Systolic pressure was within the normal   range. - Inferior vena cava: The vessel was normal in size. - Pericardium, extracardiac: There was no pericardial effusion.  Cardiac cath 08/01/01: ANGIOGRAPHIC DATA: 1. Left main coronary artery:  Normal. 2. Left anterior descending:  Left anterior descending has a high diagonal vessel.  It extends to the apex.  There is 30-40% narrowing in the mid LAD.  There is some tortuosity of the vessel at the distal two thirds. 3. Left circumflex:  The left circumflex is a large obtuse marginal.  It is essentially normal. 4. Right coronary artery:  The right coronary artery is a moderately large dominant vessel.  There is 30% narrowing in the midportion of the vessel.  There is excellent distal flow.  There is some scattered irregularities more proximal to this narrowing but distal vessels are all satisfactory. LEFT VENTRICULOGRAPHY:  Overall cardiac size and silhouette were normal.  Left ventricular function is normal. OVERALL IMPRESSION: 1. Essentially normal left ventricular function. 2. Mild  two-vessel coronary atherosclerosis. DISCUSSION:  It is felt that the patients current problem is not related to ischemic heart disease.  She has a multitude of cardiovascular risk factors which need to be modified for her to do well from a cardiovascular standpoint.   Past Medical History:  Diagnosis Date  . Arthritis   . Asthma   . COPD (chronic obstructive pulmonary disease) (HCC)   . Coronary artery disease   . Diabetes mellitus   . Hypertension     Past Surgical History:  Procedure Laterality Date  . ABDOMINAL HYSTERECTOMY    . BACK SURGERY    . CARDIAC CATHETERIZATION  2012  . CESAREAN SECTION    . CHOLECYSTECTOMY    . FOOT SURGERY    . TONSILLECTOMY    . TOTAL KNEE ARTHROPLASTY    . TUBAL LIGATION      MEDICATIONS: . albuterol (PROVENTIL HFA;VENTOLIN HFA) 108 (90 Base) MCG/ACT inhaler  . albuterol (PROVENTIL) (2.5 MG/3ML) 0.083% nebulizer solution  . bisoprolol (ZEBETA) 5 MG tablet  . cyclobenzaprine (FLEXERIL) 10 MG tablet  .  diltiazem (TIAZAC) 120 MG 24 hr capsule  . diphenhydrAMINE (BENADRYL) 25 MG tablet  . diphenhydramine-acetaminophen (TYLENOL PM) 25-500 MG TABS  . docusate sodium (COLACE) 100 MG capsule  . DULoxetine (CYMBALTA) 60 MG capsule  . ergocalciferol (VITAMIN D2) 50000 UNITS capsule  . estradiol (ESTRACE) 1 MG tablet  . fenofibrate 160 MG tablet  . Fluticasone-Salmeterol (ADVAIR DISKUS) 250-50 MCG/DOSE AEPB  . glipiZIDE (GLUCOTROL XL) 10 MG 24 hr tablet  . ibuprofen (ADVIL,MOTRIN) 200 MG tablet  . Icosapent Ethyl (VASCEPA) 1 g CAPS  . losartan (COZAAR) 100 MG tablet  . metFORMIN (GLUCOPHAGE) 1000 MG tablet  . omeprazole (PRILOSEC) 20 MG capsule  . oxymetazoline (AFRIN) 0.05 % nasal spray  . sitaGLIPtin (JANUVIA) 100 MG tablet  . tiotropium (SPIRIVA HANDIHALER) 18 MCG inhalation capsule  . torsemide (DEMADEX) 20 MG tablet  . triamterene-hydrochlorothiazide (MAXZIDE-25) 37.5-25 MG per tablet   No current facility-administered medications for  this encounter.     Shonna Chock, PA-C Surgical Short Stay/Anesthesiology [facility] Phone [phone] [facility] Phone [phone] 01/22/2019 4:39 PM

## 2019-01-22 NOTE — Anesthesia Preprocedure Evaluation (Addendum)
Anesthesia Evaluation  Patient identified by MRN, date of birth, ID band Patient awake    Reviewed: Allergy & Precautions, NPO status , Patient's Chart, lab work & pertinent test results  History of Anesthesia Complications Negative for: history of anesthetic complications  Airway Mallampati: II  TM Distance: >3 FB Neck ROM: Full    Dental no notable dental hx. (+) Dental Advisory Given   Pulmonary COPD, former smoker,    Pulmonary exam normal        Cardiovascular hypertension, + CAD  Normal cardiovascular exam  Study Highlights     The left ventricular ejection fraction is normal (55-65%).  Nuclear stress EF: 62%.  There was no ST segment deviation noted during stress.  Defect 1: There is a small defect of mild severity present in the apical inferior location.  This is a low risk study.  Findings consistent with ischemia.   Mildly abnormal, low risk stress nuclear study with mild ischemia in the distal inferior wall; EF 62 with normal wall motion.   Narrative   The left ventricular ejection fraction is normal (55-65%).  Nuclear stress EF: 62%. Show more   There was no ST segment deviation noted during stress.  Defect 1: There is a small defect of mild severity present in the apical  inferior location.  This is a low risk study.  Findings consistent with ischemia.  Mildly abnormal, low risk stress nuclear study with mild ischemia in the  distal inferior wall; EF 62 with normal wall motion.       Neuro/Psych negative neurological ROS  negative psych ROS   GI/Hepatic negative GI ROS, Neg liver ROS,   Endo/Other  diabetesMorbid obesity  Renal/GU negative Renal ROS     Musculoskeletal   Abdominal   Peds  Hematology   Anesthesia Other Findings   Reproductive/Obstetrics                           Anesthesia Physical Anesthesia Plan  ASA: III  Anesthesia Plan:  General   Post-op Pain Management:  Regional for Post-op pain   Induction: Intravenous  PONV Risk Score and Plan: 3 and Ondansetron, Dexamethasone and Diphenhydramine  Airway Management Planned: Oral ETT  Additional Equipment:   Intra-op Plan:   Post-operative Plan: Extubation in OR  Informed Consent: I have reviewed the patients History and Physical, chart, labs and discussed the procedure including the risks, benefits and alternatives for the proposed anesthesia with the patient or authorized representative who has indicated his/her understanding and acceptance.     Dental advisory given  Plan Discussed with: CRNA, Anesthesiologist and Surgeon  Anesthesia Plan Comments: (PAT note written 01/22/2019 by Shonna Chock, PA-C. HGB 9.9 (stable). If anesthesiologist feels T&S warranted then this will need to be ordered on the day of surgery. )       Anesthesia Quick Evaluation

## 2019-01-25 ENCOUNTER — Encounter (HOSPITAL_COMMUNITY): Admission: RE | Disposition: E | Payer: Self-pay | Source: Home / Self Care | Attending: Orthopedic Surgery

## 2019-01-25 ENCOUNTER — Inpatient Hospital Stay (HOSPITAL_COMMUNITY): Payer: Medicare Other | Admitting: Vascular Surgery

## 2019-01-25 ENCOUNTER — Encounter (HOSPITAL_COMMUNITY): Payer: Self-pay | Admitting: *Deleted

## 2019-01-25 ENCOUNTER — Inpatient Hospital Stay (HOSPITAL_COMMUNITY): Payer: Medicare Other | Admitting: Anesthesiology

## 2019-01-25 ENCOUNTER — Other Ambulatory Visit: Payer: Self-pay

## 2019-01-25 ENCOUNTER — Inpatient Hospital Stay (HOSPITAL_COMMUNITY)
Admission: RE | Admit: 2019-01-25 | Discharge: 2019-02-17 | DRG: 492 | Disposition: E | Payer: Medicare Other | Attending: Pulmonary Disease | Admitting: Pulmonary Disease

## 2019-01-25 DIAGNOSIS — I10 Essential (primary) hypertension: Secondary | ICD-10-CM

## 2019-01-25 DIAGNOSIS — E872 Acidosis: Secondary | ICD-10-CM | POA: Diagnosis not present

## 2019-01-25 DIAGNOSIS — Z66 Do not resuscitate: Secondary | ICD-10-CM | POA: Diagnosis not present

## 2019-01-25 DIAGNOSIS — I639 Cerebral infarction, unspecified: Secondary | ICD-10-CM

## 2019-01-25 DIAGNOSIS — J9811 Atelectasis: Secondary | ICD-10-CM | POA: Diagnosis not present

## 2019-01-25 DIAGNOSIS — I672 Cerebral atherosclerosis: Secondary | ICD-10-CM | POA: Diagnosis present

## 2019-01-25 DIAGNOSIS — Z87891 Personal history of nicotine dependence: Secondary | ICD-10-CM

## 2019-01-25 DIAGNOSIS — E870 Hyperosmolality and hypernatremia: Secondary | ICD-10-CM | POA: Diagnosis not present

## 2019-01-25 DIAGNOSIS — G460 Middle cerebral artery syndrome: Secondary | ICD-10-CM | POA: Diagnosis present

## 2019-01-25 DIAGNOSIS — G8194 Hemiplegia, unspecified affecting left nondominant side: Secondary | ICD-10-CM | POA: Diagnosis not present

## 2019-01-25 DIAGNOSIS — G9349 Other encephalopathy: Secondary | ICD-10-CM | POA: Diagnosis not present

## 2019-01-25 DIAGNOSIS — I952 Hypotension due to drugs: Secondary | ICD-10-CM | POA: Diagnosis not present

## 2019-01-25 DIAGNOSIS — S42411A Displaced simple supracondylar fracture without intercondylar fracture of right humerus, initial encounter for closed fracture: Principal | ICD-10-CM | POA: Diagnosis present

## 2019-01-25 DIAGNOSIS — Z96659 Presence of unspecified artificial knee joint: Secondary | ICD-10-CM | POA: Diagnosis present

## 2019-01-25 DIAGNOSIS — Z515 Encounter for palliative care: Secondary | ICD-10-CM | POA: Diagnosis not present

## 2019-01-25 DIAGNOSIS — I2510 Atherosclerotic heart disease of native coronary artery without angina pectoris: Secondary | ICD-10-CM | POA: Diagnosis present

## 2019-01-25 DIAGNOSIS — E860 Dehydration: Secondary | ICD-10-CM | POA: Diagnosis present

## 2019-01-25 DIAGNOSIS — J9690 Respiratory failure, unspecified, unspecified whether with hypoxia or hypercapnia: Secondary | ICD-10-CM

## 2019-01-25 DIAGNOSIS — Z881 Allergy status to other antibiotic agents status: Secondary | ICD-10-CM

## 2019-01-25 DIAGNOSIS — Z9289 Personal history of other medical treatment: Secondary | ICD-10-CM

## 2019-01-25 DIAGNOSIS — Z7189 Other specified counseling: Secondary | ICD-10-CM

## 2019-01-25 DIAGNOSIS — E119 Type 2 diabetes mellitus without complications: Secondary | ICD-10-CM

## 2019-01-25 DIAGNOSIS — R296 Repeated falls: Secondary | ICD-10-CM | POA: Diagnosis present

## 2019-01-25 DIAGNOSIS — Z4659 Encounter for fitting and adjustment of other gastrointestinal appliance and device: Secondary | ICD-10-CM

## 2019-01-25 DIAGNOSIS — G936 Cerebral edema: Secondary | ICD-10-CM | POA: Diagnosis not present

## 2019-01-25 DIAGNOSIS — R0600 Dyspnea, unspecified: Secondary | ICD-10-CM

## 2019-01-25 DIAGNOSIS — R29810 Facial weakness: Secondary | ICD-10-CM | POA: Diagnosis present

## 2019-01-25 DIAGNOSIS — G8191 Hemiplegia, unspecified affecting right dominant side: Secondary | ICD-10-CM | POA: Diagnosis not present

## 2019-01-25 DIAGNOSIS — S42401K Unspecified fracture of lower end of right humerus, subsequent encounter for fracture with nonunion: Secondary | ICD-10-CM | POA: Diagnosis not present

## 2019-01-25 DIAGNOSIS — E785 Hyperlipidemia, unspecified: Secondary | ICD-10-CM | POA: Diagnosis present

## 2019-01-25 DIAGNOSIS — E875 Hyperkalemia: Secondary | ICD-10-CM | POA: Diagnosis present

## 2019-01-25 DIAGNOSIS — R471 Dysarthria and anarthria: Secondary | ICD-10-CM | POA: Diagnosis not present

## 2019-01-25 DIAGNOSIS — I6601 Occlusion and stenosis of right middle cerebral artery: Secondary | ICD-10-CM | POA: Diagnosis present

## 2019-01-25 DIAGNOSIS — I129 Hypertensive chronic kidney disease with stage 1 through stage 4 chronic kidney disease, or unspecified chronic kidney disease: Secondary | ICD-10-CM | POA: Diagnosis present

## 2019-01-25 DIAGNOSIS — Z7951 Long term (current) use of inhaled steroids: Secondary | ICD-10-CM

## 2019-01-25 DIAGNOSIS — N183 Chronic kidney disease, stage 3 (moderate): Secondary | ICD-10-CM | POA: Diagnosis present

## 2019-01-25 DIAGNOSIS — Z801 Family history of malignant neoplasm of trachea, bronchus and lung: Secondary | ICD-10-CM

## 2019-01-25 DIAGNOSIS — J690 Pneumonitis due to inhalation of food and vomit: Secondary | ICD-10-CM | POA: Diagnosis not present

## 2019-01-25 DIAGNOSIS — E1122 Type 2 diabetes mellitus with diabetic chronic kidney disease: Secondary | ICD-10-CM | POA: Diagnosis present

## 2019-01-25 DIAGNOSIS — Z6837 Body mass index (BMI) 37.0-37.9, adult: Secondary | ICD-10-CM

## 2019-01-25 DIAGNOSIS — W19XXXA Unspecified fall, initial encounter: Secondary | ICD-10-CM | POA: Diagnosis present

## 2019-01-25 DIAGNOSIS — J449 Chronic obstructive pulmonary disease, unspecified: Secondary | ICD-10-CM | POA: Diagnosis present

## 2019-01-25 DIAGNOSIS — N179 Acute kidney failure, unspecified: Secondary | ICD-10-CM | POA: Diagnosis present

## 2019-01-25 DIAGNOSIS — E669 Obesity, unspecified: Secondary | ICD-10-CM | POA: Diagnosis present

## 2019-01-25 DIAGNOSIS — I700 Atherosclerosis of aorta: Secondary | ICD-10-CM | POA: Diagnosis present

## 2019-01-25 DIAGNOSIS — Z791 Long term (current) use of non-steroidal anti-inflammatories (NSAID): Secondary | ICD-10-CM

## 2019-01-25 DIAGNOSIS — Z885 Allergy status to narcotic agent status: Secondary | ICD-10-CM

## 2019-01-25 DIAGNOSIS — R29715 NIHSS score 15: Secondary | ICD-10-CM | POA: Diagnosis not present

## 2019-01-25 DIAGNOSIS — Z452 Encounter for adjustment and management of vascular access device: Secondary | ICD-10-CM

## 2019-01-25 DIAGNOSIS — Z9911 Dependence on respirator [ventilator] status: Secondary | ICD-10-CM

## 2019-01-25 DIAGNOSIS — Z79899 Other long term (current) drug therapy: Secondary | ICD-10-CM

## 2019-01-25 DIAGNOSIS — I6523 Occlusion and stenosis of bilateral carotid arteries: Secondary | ICD-10-CM | POA: Diagnosis present

## 2019-01-25 DIAGNOSIS — J95821 Acute postprocedural respiratory failure: Secondary | ICD-10-CM | POA: Diagnosis not present

## 2019-01-25 DIAGNOSIS — D649 Anemia, unspecified: Secondary | ICD-10-CM | POA: Diagnosis present

## 2019-01-25 DIAGNOSIS — Z832 Family history of diseases of the blood and blood-forming organs and certain disorders involving the immune mechanism: Secondary | ICD-10-CM

## 2019-01-25 DIAGNOSIS — R1310 Dysphagia, unspecified: Secondary | ICD-10-CM | POA: Diagnosis not present

## 2019-01-25 DIAGNOSIS — D509 Iron deficiency anemia, unspecified: Secondary | ICD-10-CM | POA: Diagnosis present

## 2019-01-25 DIAGNOSIS — T4275XA Adverse effect of unspecified antiepileptic and sedative-hypnotic drugs, initial encounter: Secondary | ICD-10-CM | POA: Diagnosis not present

## 2019-01-25 DIAGNOSIS — Z7984 Long term (current) use of oral hypoglycemic drugs: Secondary | ICD-10-CM

## 2019-01-25 DIAGNOSIS — Z90710 Acquired absence of both cervix and uterus: Secondary | ICD-10-CM

## 2019-01-25 DIAGNOSIS — I63411 Cerebral infarction due to embolism of right middle cerebral artery: Secondary | ICD-10-CM | POA: Diagnosis not present

## 2019-01-25 DIAGNOSIS — D72829 Elevated white blood cell count, unspecified: Secondary | ICD-10-CM | POA: Diagnosis present

## 2019-01-25 DIAGNOSIS — J9600 Acute respiratory failure, unspecified whether with hypoxia or hypercapnia: Secondary | ICD-10-CM

## 2019-01-25 HISTORY — PX: ORIF ELBOW FRACTURE: SHX5031

## 2019-01-25 LAB — CBC
HEMATOCRIT: 31.9 % — AB (ref 36.0–46.0)
Hemoglobin: 9.9 g/dL — ABNORMAL LOW (ref 12.0–15.0)
MCH: 30 pg (ref 26.0–34.0)
MCHC: 31 g/dL (ref 30.0–36.0)
MCV: 96.7 fL (ref 80.0–100.0)
Platelets: 529 10*3/uL — ABNORMAL HIGH (ref 150–400)
RBC: 3.3 MIL/uL — ABNORMAL LOW (ref 3.87–5.11)
RDW: 13.9 % (ref 11.5–15.5)
WBC: 19 10*3/uL — ABNORMAL HIGH (ref 4.0–10.5)
nRBC: 0 % (ref 0.0–0.2)

## 2019-01-25 LAB — COMPREHENSIVE METABOLIC PANEL
ALT: 15 U/L (ref 0–44)
AST: 16 U/L (ref 15–41)
Albumin: 3.1 g/dL — ABNORMAL LOW (ref 3.5–5.0)
Alkaline Phosphatase: 37 U/L — ABNORMAL LOW (ref 38–126)
Anion gap: 14 (ref 5–15)
BUN: 28 mg/dL — ABNORMAL HIGH (ref 8–23)
CO2: 27 mmol/L (ref 22–32)
Calcium: 9.9 mg/dL (ref 8.9–10.3)
Chloride: 96 mmol/L — ABNORMAL LOW (ref 98–111)
Creatinine, Ser: 1.02 mg/dL — ABNORMAL HIGH (ref 0.44–1.00)
GFR calc Af Amer: >60 mL/min (ref >60)
GFR calc non Af Amer: 55 mL/min — ABNORMAL LOW (ref >60)
Glucose, Bld: 240 mg/dL — ABNORMAL HIGH (ref 70–99)
Potassium: 5.2 mmol/L — ABNORMAL HIGH (ref 3.5–5.1)
Sodium: 137 mmol/L (ref 135–145)
Total Bilirubin: 0.7 mg/dL (ref 0.3–1.2)
Total Protein: 7 g/dL (ref 6.5–8.1)

## 2019-01-25 LAB — GLUCOSE, CAPILLARY
Glucose-Capillary: 111 mg/dL — ABNORMAL HIGH (ref 70–99)
Glucose-Capillary: 102 mg/dL — ABNORMAL HIGH (ref 70–99)
Glucose-Capillary: 173 mg/dL — ABNORMAL HIGH (ref 70–99)

## 2019-01-25 LAB — HEMOGLOBIN A1C
Hgb A1c MFr Bld: 6.4 % — ABNORMAL HIGH (ref 4.8–5.6)
MEAN PLASMA GLUCOSE: 136.98 mg/dL

## 2019-01-25 SURGERY — OPEN REDUCTION INTERNAL FIXATION (ORIF) ELBOW/OLECRANON FRACTURE
Anesthesia: General | Site: Elbow | Laterality: Right

## 2019-01-25 MED ORDER — DOCUSATE SODIUM 100 MG PO CAPS
100.0000 mg | ORAL_CAPSULE | Freq: Two times a day (BID) | ORAL | Status: DC
  Administered 2019-01-25 – 2019-01-28 (×7): 100 mg via ORAL
  Filled 2019-01-25 (×8): qty 1

## 2019-01-25 MED ORDER — LIDOCAINE 2% (20 MG/ML) 5 ML SYRINGE
INTRAMUSCULAR | Status: DC | PRN
  Administered 2019-01-25: 40 mg via INTRAVENOUS

## 2019-01-25 MED ORDER — TIOTROPIUM BROMIDE MONOHYDRATE 18 MCG IN CAPS: 18.0000 ug | ORAL_CAPSULE | Freq: Every day | RESPIRATORY_TRACT | Status: DC

## 2019-01-25 MED ORDER — ICOSAPENT ETHYL 1 G PO CAPS: 2.0000 g | ORAL_CAPSULE | Freq: Two times a day (BID) | ORAL | Status: DC

## 2019-01-25 MED ORDER — SUGAMMADEX SODIUM 200 MG/2ML IV SOLN
INTRAVENOUS | Status: DC | PRN
  Administered 2019-01-25: 200 mg via INTRAVENOUS

## 2019-01-25 MED ORDER — CYCLOBENZAPRINE HCL 10 MG PO TABS
10.0000 mg | ORAL_TABLET | Freq: Two times a day (BID) | ORAL | Status: DC | PRN
  Administered 2019-01-27: 10 mg via ORAL
  Filled 2019-01-25: qty 1

## 2019-01-25 MED ORDER — PHENYLEPHRINE 40 MCG/ML (10ML) SYRINGE FOR IV PUSH (FOR BLOOD PRESSURE SUPPORT)
PREFILLED_SYRINGE | INTRAVENOUS | Status: AC
  Filled 2019-01-25: qty 10

## 2019-01-25 MED ORDER — LACTATED RINGERS IV SOLN
INTRAVENOUS | Status: DC
  Administered 2019-01-25: 12:00:00 via INTRAVENOUS

## 2019-01-25 MED ORDER — PROMETHAZINE HCL 12.5 MG RE SUPP
12.5000 mg | Freq: Four times a day (QID) | RECTAL | Status: DC | PRN
  Filled 2019-01-25: qty 1

## 2019-01-25 MED ORDER — PROPOFOL 10 MG/ML IV BOLUS
INTRAVENOUS | Status: AC
  Filled 2019-01-25: qty 20

## 2019-01-25 MED ORDER — MIDAZOLAM HCL 2 MG/2ML IJ SOLN
INTRAMUSCULAR | Status: AC
  Administered 2019-01-25: 1 mg
  Filled 2019-01-25: qty 2

## 2019-01-25 MED ORDER — ROCURONIUM BROMIDE 50 MG/5ML IV SOSY
PREFILLED_SYRINGE | INTRAVENOUS | Status: AC
  Filled 2019-01-25: qty 5

## 2019-01-25 MED ORDER — UMECLIDINIUM BROMIDE 62.5 MCG/INH IN AEPB
1.0000 | INHALATION_SPRAY | Freq: Every day | RESPIRATORY_TRACT | Status: DC
  Administered 2019-01-26 – 2019-01-28 (×3): 1 via RESPIRATORY_TRACT
  Filled 2019-01-25: qty 7

## 2019-01-25 MED ORDER — PHENYLEPHRINE HCL 10 MG/ML IJ SOLN
INTRAMUSCULAR | Status: AC
  Filled 2019-01-25: qty 1

## 2019-01-25 MED ORDER — TRIAMTERENE-HCTZ 37.5-25 MG PO TABS: 1.0000 | ORAL_TABLET | Freq: Every day | ORAL | Status: DC

## 2019-01-25 MED ORDER — DIPHENHYDRAMINE HCL 25 MG PO CAPS: 25.0000 mg | ORAL_CAPSULE | Freq: Every day | ORAL | Status: DC | PRN

## 2019-01-25 MED ORDER — METHOCARBAMOL 500 MG PO TABS
ORAL_TABLET | ORAL | Status: AC
  Filled 2019-01-25: qty 1

## 2019-01-25 MED ORDER — ALPRAZOLAM 0.5 MG PO TABS: 0.5000 mg | ORAL_TABLET | Freq: Four times a day (QID) | ORAL | Status: DC | PRN

## 2019-01-25 MED ORDER — ONDANSETRON HCL 4 MG/2ML IJ SOLN: 4.0000 mg | Freq: Four times a day (QID) | INTRAMUSCULAR | Status: DC | PRN

## 2019-01-25 MED ORDER — ONDANSETRON HCL 4 MG/2ML IJ SOLN
INTRAMUSCULAR | Status: DC | PRN
  Administered 2019-01-25: 4 mg via INTRAVENOUS

## 2019-01-25 MED ORDER — PANTOPRAZOLE SODIUM 40 MG PO TBEC
40.0000 mg | DELAYED_RELEASE_TABLET | Freq: Every day | ORAL | Status: DC
  Administered 2019-01-26 – 2019-01-28 (×3): 40 mg via ORAL
  Filled 2019-01-25 (×3): qty 1

## 2019-01-25 MED ORDER — METHOCARBAMOL 500 MG PO TABS
500.0000 mg | ORAL_TABLET | Freq: Four times a day (QID) | ORAL | Status: DC | PRN
  Administered 2019-01-25: 500 mg via ORAL
  Filled 2019-01-25 (×2): qty 1

## 2019-01-25 MED ORDER — GLIPIZIDE ER 10 MG PO TB24
10.0000 mg | ORAL_TABLET | Freq: Every evening | ORAL | Status: DC | PRN
  Filled 2019-01-25: qty 1

## 2019-01-25 MED ORDER — FENTANYL CITRATE (PF) 100 MCG/2ML IJ SOLN
INTRAMUSCULAR | Status: AC
  Filled 2019-01-25: qty 2

## 2019-01-25 MED ORDER — TORSEMIDE 20 MG PO TABS: 10.0000 mg | ORAL_TABLET | Freq: Every day | ORAL | Status: DC

## 2019-01-25 MED ORDER — HYDROMORPHONE HCL 1 MG/ML IJ SOLN
INTRAMUSCULAR | Status: AC
  Administered 2019-01-25: 1 mg via INTRAVENOUS
  Filled 2019-01-25: qty 1

## 2019-01-25 MED ORDER — LOSARTAN POTASSIUM 50 MG PO TABS
100.0000 mg | ORAL_TABLET | Freq: Every day | ORAL | Status: DC
  Administered 2019-01-25: 100 mg via ORAL
  Filled 2019-01-25: qty 2

## 2019-01-25 MED ORDER — LINAGLIPTIN 5 MG PO TABS: 5.0000 mg | ORAL_TABLET | Freq: Every day | ORAL | Status: DC

## 2019-01-25 MED ORDER — DULOXETINE HCL 60 MG PO CPEP
60.0000 mg | ORAL_CAPSULE | Freq: Every day | ORAL | Status: DC
  Administered 2019-01-25 – 2019-02-05 (×10): 60 mg via ORAL
  Filled 2019-01-25 (×10): qty 1

## 2019-01-25 MED ORDER — ACETAMINOPHEN 500 MG PO TABS
ORAL_TABLET | ORAL | Status: AC
  Administered 2019-01-25: 1000 mg via ORAL
  Filled 2019-01-25: qty 2

## 2019-01-25 MED ORDER — LIDOCAINE 2% (20 MG/ML) 5 ML SYRINGE
INTRAMUSCULAR | Status: AC
  Filled 2019-01-25: qty 5

## 2019-01-25 MED ORDER — ONDANSETRON HCL 4 MG/2ML IJ SOLN
INTRAMUSCULAR | Status: AC
  Filled 2019-01-25: qty 2

## 2019-01-25 MED ORDER — ACETAMINOPHEN 500 MG PO TABS
1000.0000 mg | ORAL_TABLET | Freq: Four times a day (QID) | ORAL | Status: DC | PRN
  Administered 2019-01-25: 1000 mg via ORAL
  Filled 2019-01-25 (×2): qty 2

## 2019-01-25 MED ORDER — VITAMIN C 500 MG PO TABS
1000.0000 mg | ORAL_TABLET | Freq: Every day | ORAL | Status: DC
  Administered 2019-01-25 – 2019-02-03 (×8): 1000 mg via ORAL
  Filled 2019-01-25 (×8): qty 2

## 2019-01-25 MED ORDER — FENTANYL CITRATE (PF) 250 MCG/5ML IJ SOLN
INTRAMUSCULAR | Status: DC | PRN
  Administered 2019-01-25: 50 ug via INTRAVENOUS
  Administered 2019-01-25 (×2): 25 ug via INTRAVENOUS
  Administered 2019-01-25: 50 ug via INTRAVENOUS

## 2019-01-25 MED ORDER — BISOPROLOL FUMARATE 5 MG PO TABS
5.0000 mg | ORAL_TABLET | Freq: Every day | ORAL | Status: DC
  Administered 2019-01-26 – 2019-02-03 (×7): 5 mg via ORAL
  Filled 2019-01-25 (×7): qty 1

## 2019-01-25 MED ORDER — MOMETASONE FURO-FORMOTEROL FUM 200-5 MCG/ACT IN AERO
2.0000 | INHALATION_SPRAY | Freq: Two times a day (BID) | RESPIRATORY_TRACT | Status: DC
  Administered 2019-01-25 – 2019-01-28 (×7): 2 via RESPIRATORY_TRACT
  Filled 2019-01-25: qty 8.8

## 2019-01-25 MED ORDER — LACTATED RINGERS IV SOLN
INTRAVENOUS | Status: DC
  Administered 2019-01-26: 50 mL/h via INTRAVENOUS

## 2019-01-25 MED ORDER — METFORMIN HCL 500 MG PO TABS: 1000.0000 mg | ORAL_TABLET | Freq: Two times a day (BID) | ORAL | Status: DC

## 2019-01-25 MED ORDER — 0.9 % SODIUM CHLORIDE (POUR BTL) OPTIME
TOPICAL | Status: DC | PRN
  Administered 2019-01-25: 1000 mL

## 2019-01-25 MED ORDER — VANCOMYCIN HCL 10 G IV SOLR
1500.0000 mg | INTRAVENOUS | Status: DC
  Filled 2019-01-25: qty 1500

## 2019-01-25 MED ORDER — FENTANYL CITRATE (PF) 100 MCG/2ML IJ SOLN
INTRAMUSCULAR | Status: AC
  Administered 2019-01-25: 50 ug
  Filled 2019-01-25: qty 2

## 2019-01-25 MED ORDER — SODIUM CHLORIDE 0.9 % IV SOLN
INTRAVENOUS | Status: DC | PRN
  Administered 2019-01-25: 25 ug/min via INTRAVENOUS

## 2019-01-25 MED ORDER — ALBUTEROL SULFATE (2.5 MG/3ML) 0.083% IN NEBU
3.0000 mL | INHALATION_SOLUTION | Freq: Four times a day (QID) | RESPIRATORY_TRACT | Status: DC | PRN
  Administered 2019-02-01: 3 mL via RESPIRATORY_TRACT
  Filled 2019-01-25 (×2): qty 3

## 2019-01-25 MED ORDER — DEXAMETHASONE SODIUM PHOSPHATE 10 MG/ML IJ SOLN
INTRAMUSCULAR | Status: DC | PRN
  Administered 2019-01-25: 5 mg via INTRAVENOUS

## 2019-01-25 MED ORDER — OXYCODONE HCL 5 MG PO TABS
5.0000 mg | ORAL_TABLET | ORAL | Status: DC | PRN
  Administered 2019-01-25: 5 mg via ORAL
  Administered 2019-01-26 – 2019-01-27 (×2): 10 mg via ORAL
  Administered 2019-01-27 (×2): 5 mg via ORAL
  Filled 2019-01-25 (×2): qty 2
  Filled 2019-01-25 (×3): qty 1
  Filled 2019-01-25: qty 2

## 2019-01-25 MED ORDER — MEPIVACAINE HCL 1.5 % IJ SOLN
INTRAMUSCULAR | Status: DC | PRN
  Administered 2019-01-25: 30 mL via PERINEURAL

## 2019-01-25 MED ORDER — ROCURONIUM BROMIDE 10 MG/ML (PF) SYRINGE
PREFILLED_SYRINGE | INTRAVENOUS | Status: DC | PRN
  Administered 2019-01-25 (×2): 20 mg via INTRAVENOUS
  Administered 2019-01-25: 10 mg via INTRAVENOUS
  Administered 2019-01-25: 50 mg via INTRAVENOUS

## 2019-01-25 MED ORDER — VANCOMYCIN HCL IN DEXTROSE 1-5 GM/200ML-% IV SOLN
1000.0000 mg | INTRAVENOUS | Status: AC
  Administered 2019-01-25: 1000 mg via INTRAVENOUS
  Filled 2019-01-25: qty 200

## 2019-01-25 MED ORDER — INSULIN ASPART 100 UNIT/ML ~~LOC~~ SOLN
0.0000 [IU] | SUBCUTANEOUS | Status: DC
  Administered 2019-01-26 – 2019-01-27 (×4): 2 [IU] via SUBCUTANEOUS
  Administered 2019-01-27: 1 [IU] via SUBCUTANEOUS
  Administered 2019-01-27: 3 [IU] via SUBCUTANEOUS
  Administered 2019-01-27 (×3): 2 [IU] via SUBCUTANEOUS
  Administered 2019-01-28 (×4): 1 [IU] via SUBCUTANEOUS
  Administered 2019-01-28: 3 [IU] via SUBCUTANEOUS
  Administered 2019-01-28: 1 [IU] via SUBCUTANEOUS

## 2019-01-25 MED ORDER — IPRATROPIUM BROMIDE 0.02 % IN SOLN: 0.5000 mg | Freq: Four times a day (QID) | RESPIRATORY_TRACT | Status: DC

## 2019-01-25 MED ORDER — OMEGA-3-ACID ETHYL ESTERS 1 G PO CAPS
2.0000 g | ORAL_CAPSULE | Freq: Two times a day (BID) | ORAL | Status: DC
  Administered 2019-01-25 – 2019-01-28 (×7): 2 g via ORAL
  Filled 2019-01-25 (×8): qty 2

## 2019-01-25 MED ORDER — METHOCARBAMOL 1000 MG/10ML IJ SOLN
500.0000 mg | Freq: Four times a day (QID) | INTRAVENOUS | Status: DC | PRN
  Filled 2019-01-25: qty 5

## 2019-01-25 MED ORDER — FENTANYL CITRATE (PF) 250 MCG/5ML IJ SOLN
INTRAMUSCULAR | Status: AC
  Filled 2019-01-25: qty 5

## 2019-01-25 MED ORDER — DILTIAZEM HCL ER COATED BEADS 120 MG PO CP24
120.0000 mg | ORAL_CAPSULE | Freq: Every day | ORAL | Status: DC
  Administered 2019-01-25 – 2019-01-28 (×4): 120 mg via ORAL
  Filled 2019-01-25 (×5): qty 1

## 2019-01-25 MED ORDER — CYCLOBENZAPRINE HCL 10 MG PO TABS
10.0000 mg | ORAL_TABLET | Freq: Every day | ORAL | Status: DC
  Administered 2019-01-26 – 2019-01-28 (×3): 10 mg via ORAL
  Filled 2019-01-25 (×4): qty 1

## 2019-01-25 MED ORDER — ALBUTEROL SULFATE (2.5 MG/3ML) 0.083% IN NEBU
2.5000 mg | INHALATION_SOLUTION | RESPIRATORY_TRACT | Status: DC | PRN
  Administered 2019-01-28 (×2): 2.5 mg via RESPIRATORY_TRACT
  Filled 2019-01-25: qty 3

## 2019-01-25 MED ORDER — ONDANSETRON HCL 4 MG PO TABS: 4.0000 mg | ORAL_TABLET | Freq: Four times a day (QID) | ORAL | Status: DC | PRN

## 2019-01-25 MED ORDER — MIDAZOLAM HCL 2 MG/2ML IJ SOLN
INTRAMUSCULAR | Status: AC
  Filled 2019-01-25: qty 2

## 2019-01-25 MED ORDER — CHLORHEXIDINE GLUCONATE 4 % EX LIQD: 60.0000 mL | Freq: Once | CUTANEOUS | Status: DC

## 2019-01-25 MED ORDER — DEXAMETHASONE SODIUM PHOSPHATE 10 MG/ML IJ SOLN
INTRAMUSCULAR | Status: AC
  Filled 2019-01-25: qty 1

## 2019-01-25 MED ORDER — PROPOFOL 10 MG/ML IV BOLUS
INTRAVENOUS | Status: DC | PRN
  Administered 2019-01-25: 100 mg via INTRAVENOUS

## 2019-01-25 MED ORDER — HYDROMORPHONE HCL 1 MG/ML IJ SOLN
0.5000 mg | INTRAMUSCULAR | Status: DC | PRN
  Administered 2019-01-25 – 2019-01-26 (×5): 1 mg via INTRAVENOUS
  Filled 2019-01-25 (×4): qty 1

## 2019-01-25 SURGICAL SUPPLY — 78 items
BANDAGE ACE 3X5.8 VEL STRL LF (GAUZE/BANDAGES/DRESSINGS) ×3 IMPLANT
BANDAGE ACE 4X5 VEL STRL LF (GAUZE/BANDAGES/DRESSINGS) ×3 IMPLANT
BIT DRILL 2.5X2.75 QC CALB (BIT) ×2 IMPLANT
BIT DRILL 4.8X200 CANN (BIT) ×2 IMPLANT
BIT DRILL CALIBRATED 2.7 (BIT) ×1 IMPLANT
BIT DRILL CALIBRATED 2.7MM (BIT) ×1
BLADE SURG 15 STRL LF DISP TIS (BLADE) IMPLANT
BLADE SURG 15 STRL SS (BLADE) ×4
BNDG COHESIVE 4X5 TAN STRL (GAUZE/BANDAGES/DRESSINGS) ×3 IMPLANT
BNDG ESMARK 4X9 LF (GAUZE/BANDAGES/DRESSINGS) ×3 IMPLANT
BNDG GAUZE ELAST 4 BULKY (GAUZE/BANDAGES/DRESSINGS) ×5 IMPLANT
CABLE CERCLAGE W/NDL CRIMP (Cable) IMPLANT
CABLE CERLAGE W/NEEDLE CRIMP (Cable) ×3 IMPLANT
CORDS BIPOLAR (ELECTRODE) ×3 IMPLANT
COVER MAYO STAND STRL (DRAPES) ×3 IMPLANT
COVER SURGICAL LIGHT HANDLE (MISCELLANEOUS) ×3 IMPLANT
COVER WAND RF STERILE (DRAPES) ×3 IMPLANT
CUFF TOURNIQUET SINGLE 18IN (TOURNIQUET CUFF) ×3 IMPLANT
CUFF TOURNIQUET SINGLE 24IN (TOURNIQUET CUFF) IMPLANT
DRAPE INCISE IOBAN 66X45 STRL (DRAPES) ×3 IMPLANT
DRAPE OEC MINIVIEW 54X84 (DRAPES) IMPLANT
DRSG ADAPTIC 3X8 NADH LF (GAUZE/BANDAGES/DRESSINGS) ×4 IMPLANT
EVACUATOR 1/8 PVC DRAIN (DRAIN) ×2 IMPLANT
GAUZE SPONGE 4X4 12PLY STRL (GAUZE/BANDAGES/DRESSINGS) ×3 IMPLANT
GAUZE XEROFORM 1X8 LF (GAUZE/BANDAGES/DRESSINGS) ×3 IMPLANT
GAUZE XEROFORM 5X9 LF (GAUZE/BANDAGES/DRESSINGS) ×4 IMPLANT
GLOVE BIOGEL M 8.0 STRL (GLOVE) ×3 IMPLANT
GLOVE SS BIOGEL STRL SZ 8 (GLOVE) ×1 IMPLANT
GLOVE SUPERSENSE BIOGEL SZ 8 (GLOVE) ×2
GOWN STRL REUS W/ TWL LRG LVL3 (GOWN DISPOSABLE) ×2 IMPLANT
GOWN STRL REUS W/ TWL XL LVL3 (GOWN DISPOSABLE) ×3 IMPLANT
GOWN STRL REUS W/TWL LRG LVL3 (GOWN DISPOSABLE) ×4
GOWN STRL REUS W/TWL XL LVL3 (GOWN DISPOSABLE) ×6
KIT BASIN OR (CUSTOM PROCEDURE TRAY) ×3 IMPLANT
KIT TURNOVER KIT B (KITS) ×3 IMPLANT
LOOP VESSEL MAXI BLUE (MISCELLANEOUS) ×2 IMPLANT
MANIFOLD NEPTUNE II (INSTRUMENTS) ×3 IMPLANT
NDL HYPO 25GX1X1/2 BEV (NEEDLE) IMPLANT
NEEDLE HYPO 25GX1X1/2 BEV (NEEDLE) IMPLANT
NS IRRIG 1000ML POUR BTL (IV SOLUTION) ×3 IMPLANT
PACK ORTHO EXTREMITY (CUSTOM PROCEDURE TRAY) ×3 IMPLANT
PAD ARMBOARD 7.5X6 YLW CONV (MISCELLANEOUS) ×6 IMPLANT
PAD CAST 4YDX4 CTTN HI CHSV (CAST SUPPLIES) ×2 IMPLANT
PADDING CAST COTTON 4X4 STRL (CAST SUPPLIES) ×2
PIN GUIDE DRILL TIP 2.8X300 (DRILL) ×2 IMPLANT
PLATE LOCK RT LG 85X10.7X2.4X9 (Plate) IMPLANT
PLATE LOCK RT LRG (Plate) ×2 IMPLANT
PLATE LOCK RT SM (Plate) ×2 IMPLANT
PLATE LOCK RT SM 88X10.9X2.5X9 (Plate) IMPLANT
SCREW CANN 6.5X100X40MM THRD (Screw) ×2 IMPLANT
SCREW CORT T15 30X3.5XST LCK (Screw) IMPLANT
SCREW CORTICAL 3.5X30MM (Screw) ×2 IMPLANT
SCREW CORTICAL LOW PROF 3.5X20 (Screw) ×2 IMPLANT
SCREW LOCK 3.5X24 DIST TIB (Screw) ×2 IMPLANT
SCREW LOCK CORT STAR 3.5X14 (Screw) ×2 IMPLANT
SCREW LOCK CORT STAR 3.5X16 (Screw) ×8 IMPLANT
SCREW LOCK CORT STAR 3.5X18 (Screw) ×2 IMPLANT
SCREW LOCK CORT STAR 3.5X22 (Screw) ×2 IMPLANT
SCREW LOCK CORT STAR 3.5X24 (Screw) ×2 IMPLANT
SCREW LOCK CORT STAR 3.5X28 (Screw) ×2 IMPLANT
SCREW LOCK CORT STAR 3.5X40 (Screw) ×4 IMPLANT
SCREW LOW PROFILE 18MMX3.5MM (Screw) ×2 IMPLANT
SCRUB BETADINE 4OZ XXX (MISCELLANEOUS) ×3 IMPLANT
SLING ARM FOAM STRAP LRG (SOFTGOODS) ×2 IMPLANT
SOL PREP POV-IOD 4OZ 10% (MISCELLANEOUS) ×6 IMPLANT
SPECIMEN JAR SMALL (MISCELLANEOUS) ×3 IMPLANT
SPONGE LAP 18X18 RF (DISPOSABLE) ×2 IMPLANT
SUT PROLENE 3 0 PS 2 (SUTURE) ×8 IMPLANT
SUT VIC AB 3-0 FS2 27 (SUTURE) ×7 IMPLANT
SYR CONTROL 10ML LL (SYRINGE) IMPLANT
TOWEL OR 17X24 6PK STRL BLUE (TOWEL DISPOSABLE) ×3 IMPLANT
TOWEL OR 17X26 10 PK STRL BLUE (TOWEL DISPOSABLE) ×6 IMPLANT
TUBE CONNECTING 12'X1/4 (SUCTIONS)
TUBE CONNECTING 12X1/4 (SUCTIONS) IMPLANT
UNDERPAD 30X30 (UNDERPADS AND DIAPERS) ×3 IMPLANT
WASHER 3.5MM (Orthopedic Implant) ×4 IMPLANT
WASHER FLAT 6.5MM (Washer) ×2 IMPLANT
WATER STERILE IRR 1000ML POUR (IV SOLUTION) ×1 IMPLANT

## 2019-01-25 NOTE — Progress Notes (Signed)
Pharmacy Antibiotic Note  Angelica [name] is a 73 y.o. female admitted on 02-17-19 with nonunion supracondylar humerus fracture RUE.  Pharmacy has been consulted for vancomycin dosing for post-op prophylaxis.   Vancomycin 1000 mg IV given at 11:44. Renal function is normal.  Plan: Vancomycin 1500 mg IV q24h  Monitor renal function, clinical progress, and LOT  Height: 5\' 1"  (154.9 cm) Weight: 189 lb 9.5 oz (86 kg) IBW/kg (Calculated) : 47.8  Temp (24hrs), Avg:97.8 F (36.6 C), Min:97.5 F (36.4 C), Max:98.3 F (36.8 C)  Recent Labs  Lab 01/21/19 1324  WBC 11.5*  CREATININE 0.88    Estimated Creatinine Clearance: 57.6 mL/min (by C-G formula based on SCr of 0.88 mg/dL).    Allergies  Allergen Reactions  . Bee Venom Anaphylaxis  . Levaquin [Levofloxacin] Anaphylaxis and Itching  . Amoxicillin-Pot Clavulanate Other (See Comments)    Upset GI Did it involve swelling of the face/tongue/throat, SOB, or low BP? No Did it involve sudden or severe rash/hives, skin peeling, or any reaction on the inside of your mouth or nose? No Did you need to seek medical attention at a hospital or doctor's office? No When did it last happen?10-20 years If all above answers are "NO", may proceed with cephalosporin use.   Marland Kitchen Morphine And Related Rash  . Rocephin [Ceftriaxone Sodium In Dextrose] Rash and Other (See Comments)  . Latex Itching and Swelling  . Quinine Derivatives     Decreased platelet counts     Thank you for allowing pharmacy to be a part of this patient's care.  Loura Back, PharmD, BCPS Clinical Pharmacist Clinical phone for 02/17/19 until 10p is [phone] 17-Feb-2019 8:17 PM  **Pharmacist phone directory can now be found on [email or web address] listed under [facility]**

## 2019-01-25 NOTE — Anesthesia Postprocedure Evaluation (Signed)
Anesthesia Post Note  Patient: JENNEE NAPPA  Procedure(s) Performed: Right elbow open reduction internal fixation with olecranon osteotomy and ulnar nerve decompression (Right Elbow)     Patient location during evaluation: PACU Anesthesia Type: General Level of consciousness: awake Pain management: pain level controlled Vital Signs Assessment: post-procedure vital signs reviewed and stable Respiratory status: spontaneous breathing Cardiovascular status: stable Postop Assessment: no apparent nausea or vomiting Anesthetic complications: no    Last Vitals:  Vitals:   02/14/2019 1848 01/19/2019 1903  BP: (!) 147/57 (!) 155/57  Pulse: 69 74  Resp: 15 15  Temp:    SpO2: 93% 92%    Last Pain:  Vitals:   01/24/2019 1903  PainSc: Asleep                 Alinda Egolf

## 2019-01-25 NOTE — H&P (Signed)
Angelica [name] Name is an 73 y.o. female.   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 The patient denies neck, back, chest or  abdominal pain. The patient notes that they have no lower extremity problems. The patients primary complaint is noted. We are planning surgical care pathway for the upper extremity.  Past Medical History:  Diagnosis Date  . Arthritis   . Asthma   . COPD (chronic obstructive pulmonary disease) (HCC)   . Coronary artery disease   . Diabetes mellitus   . Hypertension     Past Surgical History:  Procedure Laterality Date  . ABDOMINAL HYSTERECTOMY    . BACK SURGERY    . CARDIAC CATHETERIZATION  2012   Mild 2V CAD 08/01/01 LHC  . CESAREAN SECTION    . CHOLECYSTECTOMY    . FOOT SURGERY    . TONSILLECTOMY    . TOTAL KNEE ARTHROPLASTY    . TUBAL LIGATION      Family History  Problem Relation Age of Onset  . Pneumonia Mother   . Lupus Mother   . Lung cancer Father    Social History:  reports that she has quit smoking. Her smoking use included cigarettes. She has a 30.00 pack-year smoking history. She has never used smokeless tobacco. She reports that she does not drink alcohol or use drugs.  Allergies:  Allergies  Allergen Reactions  . Bee Venom Anaphylaxis  . Levaquin [Levofloxacin] Anaphylaxis and Itching  . Amoxicillin-Pot Clavulanate Other (See Comments)    Upset GI Did it involve swelling of the face/tongue/throat, SOB, or low BP? No Did it involve sudden or severe rash/hives, skin peeling, or any reaction on the inside of your mouth or nose? No Did you need to seek medical attention at a hospital or doctor'[name] office? No When did it last happen?10-20 years If all above answers are "NO", may proceed with cephalosporin use.   Marland Kitchen. Morphine And Related Rash  . Rocephin [Ceftriaxone Sodium In Dextrose] Rash and Other (See  Comments)  . Latex Itching and Swelling  . Quinine Derivatives     Decreased platelet counts    Medications Prior to Admission  Medication Sig Dispense Refill  . albuterol (PROVENTIL HFA;VENTOLIN HFA) 108 (90 Base) MCG/ACT inhaler Inhale 2 puffs into the lungs every 6 (six) hours as needed for wheezing or shortness of breath.    Marland Kitchen. albuterol (PROVENTIL) (2.5 MG/3ML) 0.083% nebulizer solution Take 2.5 mg by nebulization every 4 (four) hours as needed for wheezing.     . bisoprolol (ZEBETA) 5 MG tablet Take 1 tablet (5 mg total) by mouth daily. 90 tablet 3  . cyclobenzaprine (FLEXERIL) 10 MG tablet Take 10 mg by mouth See admin instructions. Take 10 mg at bedtime, may take an additional 10 mg twice daily as needed for muscle spasms    . diltiazem (TIAZAC) 120 MG 24 hr capsule Take 120 mg by mouth at bedtime.    . diphenhydrAMINE (BENADRYL) 25 MG tablet Take 25 mg by mouth daily as needed for allergies.    . diphenhydramine-acetaminophen (TYLENOL PM) 25-500 MG TABS Take 2 tablets by mouth at bedtime as needed (sleep).     . docusate sodium (COLACE) 100 MG capsule Take 100-300 mg by mouth at bedtime.     . DULoxetine (CYMBALTA) 60 MG capsule Take 60 mg by mouth daily.    . ergocalciferol (VITAMIN D2) 50000 UNITS capsule Take 50,000  Units by mouth every Saturday.     . estradiol (ESTRACE) 1 MG tablet Take 1 mg by mouth at bedtime.    . fenofibrate 160 MG tablet Take 160 mg by mouth at bedtime.    . Fluticasone-Salmeterol (ADVAIR DISKUS) 250-50 MCG/DOSE AEPB Inhale 2 puffs into the lungs every 12 (twelve) hours.     Marland Kitchen glipiZIDE (GLUCOTROL XL) 10 MG 24 hr tablet Take 10 mg by mouth at bedtime as needed (if blood sugar is over 150).     Marland Kitchen ibuprofen (ADVIL,MOTRIN) 200 MG tablet Take 400-600 mg by mouth 3 (three) times daily as needed for moderate pain. For pain     . Icosapent Ethyl (VASCEPA) 1 g CAPS Take 2 capsules (2 g total) by mouth 2 (two) times daily. 360 capsule 3  . losartan (COZAAR) 100 MG  tablet Take 100 mg by mouth at bedtime.     . metFORMIN (GLUCOPHAGE) 1000 MG tablet Take 1,000 mg by mouth 2 (two) times daily with a meal.    . omeprazole (PRILOSEC) 20 MG capsule Take 20 mg by mouth 2 (two) times daily.    Marland Kitchen oxymetazoline (AFRIN) 0.05 % nasal spray Place 1 spray into both nostrils at bedtime as needed for congestion.    . sitaGLIPtin (JANUVIA) 100 MG tablet Take 100 mg by mouth daily.    Marland Kitchen tiotropium (SPIRIVA HANDIHALER) 18 MCG inhalation capsule Place 18 mcg into inhaler and inhale daily.     Marland Kitchen torsemide (DEMADEX) 20 MG tablet Take 0.5 tablets (10 mg total) by mouth daily. 90 tablet 3  . triamterene-hydrochlorothiazide (MAXZIDE-25) 37.5-25 MG per tablet Take 1 tablet by mouth every morning.      Results for orders placed or performed during the hospital encounter of 02/09/2019 (from the past 48 hour([name]))  Glucose, capillary     Status: Abnormal   Collection Time: 2019-02-09 11:41 AM  Result Value Ref Range   Glucose-Capillary 111 (H) 70 - 99 mg/dL  Glucose, capillary     Status: Abnormal   Collection Time: 02-09-2019  1:39 PM  Result Value Ref Range   Glucose-Capillary 102 (H) 70 - 99 mg/dL   No results found.  ROS  Height 5\' 1"  (1.549 m), weight 86 kg. Physical Exam  Nonunion supracondylar humerus fracture right elbow.  She is intact to sensation and motor function at present juncture.  She has a unstable elbow with significant abnormality and inability to use as usual and customary patients would.  Given the complete instability we will plan for surgical reconstruction.  She has been cleared by cardiology.  The patient is alert and oriented in no acute distress. The patient complains of pain in the affected upper extremity.  The patient is noted to have a normal HEENT exam. Lung fields show equal chest expansion and no shortness of breath. Abdomen exam is nontender without distention. Lower extremity examination does not show any fracture dislocation or blood clot  symptoms. Pelvis is stable and the neck and back are stable and nontender. Assessment/Plan We are planning surgery for your upper extremity. The risk and benefits of surgery to include risk of bleeding, infection, anesthesia,  damage to normal structures and failure of the surgery to accomplish its intended goals of relieving symptoms and restoring function have been discussed in detail. With this in mind we plan to proceed. I have specifically discussed with the patient the pre-and postoperative regime and the dos and don'ts and risk and benefits in great detail. Risk and benefits of  surgery also include risk of dystrophy(CRPS), chronic nerve pain, failure of the healing process to go onto completion and other inherent risks of surgery The relavent the pathophysiology of the disease/injury process, as well as the alternatives for treatment and postoperative course of action has been discussed in great detail with the patient who desires to proceed.  We will do everything in our power to help you (the patient) restore function to the upper extremity. It is a pleasure to see this patient today. We will plan for open reduction internal fixation and repair reconstruction is necessary right upper extremity.  Patient has a nonunion supracondylar humerus fracture and we will plan for surgical reconstruction.  Oletta Cohn III, MD 02/14/2019, 2:36 PM

## 2019-01-25 NOTE — Progress Notes (Signed)
Report  red'd from [place], RN at [phone]. Pt placed on PACU hold due to shift change. Floor orders to be used.

## 2019-01-25 NOTE — Transfer of Care (Signed)
Immediate Anesthesia Transfer of Care Note  Patient: Angelica [name]  Procedure(s) Performed: Right elbow open reduction internal fixation with olecranon osteotomy and ulnar nerve decompression (Right Elbow)  Patient Location: PACU  Anesthesia Type:GA combined with regional for post-op pain  Level of Consciousness: awake, alert , oriented and patient cooperative  Airway & Oxygen Therapy: Patient Spontanous Breathing and Patient connected to nasal cannula oxygen  Post-op Assessment: Report given to RN, Post -op Vital signs reviewed and stable and Patient moving all extremities  Post vital signs: Reviewed and stable  Last Vitals:  Vitals Value Taken Time  BP 124/53 02-17-2019  6:33 PM  Temp 36.8 C 02-17-2019  6:33 PM  Pulse 69 02-17-2019  6:39 PM  Resp 16 2019-02-17  6:39 PM  SpO2 93 % 02-17-2019  6:39 PM  Vitals shown include unvalidated device data.  Last Pain:  Vitals:   February 17, 2019 1833  PainSc: (P) 0-No pain         Complications: No apparent anesthesia complications

## 2019-01-25 NOTE — Anesthesia Procedure Notes (Signed)
Anesthesia Regional Block: Supraclavicular block   Pre-Anesthetic Checklist: ,, timeout performed, Correct Patient, Correct Site, Correct Laterality, Correct Procedure, Correct Position, site marked, Risks and benefits discussed,  Surgical consent,  Pre-op evaluation,  At surgeon's request and post-op pain management  Laterality: Right  Prep: chloraprep       Needles:  Injection technique: Single-shot  Needle Type: Echogenic Stimulator Needle     Needle Length: 5cm  Needle Gauge: 22     Additional Needles:   Narrative:  Start time: 02/09/2019 12:09 PM End time: 02/09/2019 12:19 PM Injection made incrementally with aspirations every 5 mL.  Performed by: Personally  Anesthesiologist: Heather Roberts, MD  Additional Notes: Functioning IV was confirmed and monitors applied.  A 15mm 22ga echogenic arrow stimulator was used. Sterile prep and drape,hand hygiene and sterile gloves were used.Ultrasound guidance: relevant anatomy identified, needle position confirmed, local anesthetic spread visualized around nerve(s)., vascular puncture avoided.  Image printed for medical record.  Negative aspiration and negative test dose prior to incremental administration of local anesthetic. The patient tolerated the procedure well.

## 2019-01-25 NOTE — Consult Note (Signed)
[facility] Medical Consultation  Angelica [name] ZOX:[id number] DOB: 03-Oct-1946 DOA: 01/20/2019 PCP: Joette Catching, MD   Requesting physician: Dr. Amanda Pea Date of consultation: 02/08/2019   Reason for consultation: Medical management  Impression/Recommendations  Active Problems:   Anemia -  check CBC postop transfuse   as needed if symptomatic or if hemoglobin below 7   HTN (hypertension) we will continue home medications except for Maxzide which we will hold while hospitalized and monitor of fluid status   DM type 2 (diabetes mellitus, type 2) (HCC) -  - Order Sensitive    SSI   -  check TSH and HgA1C  - Hold by mouth medications   Hyperkalemia- mild will hold cozaar for now and repeat in AM avoid potassium containing fluids    COPD (chronic obstructive pulmonary disease) (HCC) -chronic stable resume home medications and change to nebulizers while hospitalized   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 intake and there is evidence of euvolemia  [facility] followup again tomorrow. Please contact me if I can be of assistance in the meanwhile. Thank you for this consultation.  Chief Complaint: Admitted for elbow repair  HPI: 73 yo w DM2, CAD, COPD, Asthma, HTN, iron deficiency anemia. IN October 29th she had a fall and injured her right elbow resulting in humerus fracture. Attempted to heal using a cast but had a non-union. Undergone elbow repair.  Sp elbow reconstruction done by Dr. Amanda Pea today secondary to nonunion supracondylar humerus fracture On vanc per primary team  Patient has been followed by hematology for history of iron deficiency anemia recent hemoglobin down to 9.7 Last iron infusion was on 27 January  Has frequent falls Has history of fluid retention for which she takes torsemide daily History of diabetes  for which she is on glipizide.  Metformin and Januvia Patient has been seen by cardiology prior to elbow repair for cardiac clearance Last echo was December 2019 showing grade 1 diastolic dysfunction.   History of COPD for which she takes Advair and albuterol as needed as well as Spiriva inhaler  History of hypertension for which she is on bisoprolol, diltiazem   losartan and Maxzide   Review of Systems:,   Pertinent positives include:    Constitutional:  No weight loss, night sweats, Fevers, chills, weight loss fatigue  HEENT:  No headaches, Difficulty swallowing,Tooth/dental problems,Sore throat,  No sneezing, itching, ear ache, nasal congestion, post nasal drip,  Cardio-vascular:  No chest pain, Orthopnea, PND, anasarca, dizziness, palpitations.no Bilateral lower extremity swelling  GI:  No heartburn, indigestion, abdominal pain, nausea, vomiting, diarrhea, change in bowel habits, loss of appetite, melena, blood in stool, hematemesis Resp:  No shortness of breath at rest. No dyspnea on exertion,No excess mucus, no productive cough, No non-productive cough, No coughing up of blood.No change in color of mucus.No wheezing. Skin:  no rash or lesions. No jaundice GU:  no dysuria, change in color of urine, no urgency or frequency. No straining to urinate.  No flank pain.  Musculoskeletal:  No joint pain or no joint swelling. No decreased range of motion. No back pain.  Psych:  No change in mood or affect. No depression or anxiety. No memory loss.  Neuro: no localizing neurological complaints, no tingling, no weakness, no double vision, no gait abnormality, no slurred speech, no confusion   All systems  reviewed and apart from HOPI all are negative  Past Medical History:  Diagnosis Date  . Arthritis   . Asthma   . COPD (chronic obstructive pulmonary disease) (HCC)   . Coronary artery disease   . Diabetes mellitus   . Hypertension    Past Surgical History:  Procedure  Laterality Date  . ABDOMINAL HYSTERECTOMY    . BACK SURGERY    . CARDIAC CATHETERIZATION  2012   Mild 2V CAD 08/01/01 LHC  . CESAREAN SECTION    . CHOLECYSTECTOMY    . FOOT SURGERY    . TONSILLECTOMY    . TOTAL KNEE ARTHROPLASTY    . TUBAL LIGATION     Social History:  reports that she has quit smoking. Her smoking use included cigarettes. She has a 30.00 pack-year smoking history. She has never used smokeless tobacco. She reports that she does not drink alcohol or use drugs.  Allergies  Allergen Reactions  . Bee Venom Anaphylaxis  . Levaquin [Levofloxacin] Anaphylaxis and Itching  . Amoxicillin-Pot Clavulanate Other (See Comments)    Upset GI Did it involve swelling of the face/tongue/throat, SOB, or low BP? No Did it involve sudden or severe rash/hives, skin peeling, or any reaction on the inside of your mouth or nose? No Did you need to seek medical attention at a hospital or doctor's office? No When did it last happen?10-20 years If all above answers are "NO", may proceed with cephalosporin use.   Marland Kitchen. Morphine And Related Rash  . Rocephin [Ceftriaxone Sodium In Dextrose] Rash and Other (See Comments)  . Latex Itching and Swelling  . Quinine Derivatives     Decreased platelet counts   Family History  Problem Relation Age of Onset  . Pneumonia Mother   . Lupus Mother   . Lung cancer Father     Prior to Admission medications   Medication Sig Start Date End Date Taking? Authorizing Provider  albuterol (PROVENTIL HFA;VENTOLIN HFA) 108 (90 Base) MCG/ACT inhaler Inhale 2 puffs into the lungs every 6 (six) hours as needed for wheezing or shortness of breath.   Yes [provider]  albuterol (PROVENTIL) (2.5 MG/3ML) 0.083% nebulizer solution Take 2.5 mg by nebulization every 4 (four) hours as needed for wheezing.  09/15/17  Yes [provider]  bisoprolol (ZEBETA) 5 MG tablet Take 1 tablet (5 mg total) by mouth daily. 11/19/18  Yes Lennette BihariKelly, Thomas A, MD   cyclobenzaprine (FLEXERIL) 10 MG tablet Take 10 mg by mouth See admin instructions. Take 10 mg at bedtime, may take an additional 10 mg twice daily as needed for muscle spasms   Yes [provider]  diltiazem (TIAZAC) 120 MG 24 hr capsule Take 120 mg by mouth at bedtime.   Yes [provider]  diphenhydrAMINE (BENADRYL) 25 MG tablet Take 25 mg by mouth daily as needed for allergies.   Yes [provider]  diphenhydramine-acetaminophen (TYLENOL PM) 25-500 MG TABS Take 2 tablets by mouth at bedtime as needed (sleep).    Yes [provider]  docusate sodium (COLACE) 100 MG capsule Take 100-300 mg by mouth at bedtime.    Yes [provider]  DULoxetine (CYMBALTA) 60 MG capsule Take 60 mg by mouth daily.   Yes [provider]  ergocalciferol (VITAMIN D2) 50000 UNITS capsule Take 50,000 Units by mouth every Saturday.    Yes [provider]  estradiol (ESTRACE) 1 MG tablet Take 1 mg by mouth at bedtime.   Yes [provider]  fenofibrate 160 MG tablet Take 160 mg by mouth at bedtime.   Yes [provider]  Fluticasone-Salmeterol (ADVAIR DISKUS) 250-50 MCG/DOSE AEPB Inhale 2 puffs into the lungs every 12 (twelve) hours.    Yes [provider]  glipiZIDE (GLUCOTROL XL) 10 MG 24 hr tablet Take 10 mg by mouth at bedtime as needed (if blood sugar is over 150).  12/27/17  Yes [provider]  ibuprofen (ADVIL,MOTRIN) 200 MG tablet Take 400-600 mg by mouth 3 (three) times daily as needed for moderate pain. For pain    Yes [provider]  Icosapent Ethyl (VASCEPA) 1 g CAPS Take 2 capsules (2 g total) by mouth 2 (two) times daily. 11/19/18  Yes Lennette BihariKelly, Thomas A, MD  losartan (COZAAR) 100 MG tablet Take 100 mg by mouth at bedtime.  08/15/17  Yes [provider]  metFORMIN (GLUCOPHAGE) 1000 MG tablet Take 1,000 mg by mouth 2 (two) times daily with a meal.   Yes [provider]  omeprazole  (PRILOSEC) 20 MG capsule Take 20 mg by mouth 2 (two) times daily.   Yes [provider]  oxymetazoline (AFRIN) 0.05 % nasal spray Place 1 spray into both nostrils at bedtime as needed for congestion.   Yes [provider]  sitaGLIPtin (JANUVIA) 100 MG tablet Take 100 mg by mouth daily.   Yes [provider]  tiotropium (SPIRIVA HANDIHALER) 18 MCG inhalation capsule Place 18 mcg into inhaler and inhale daily.  08/17/17  Yes [provider]  torsemide (DEMADEX) 20 MG tablet Take 0.5 tablets (10 mg total) by mouth daily. 12/11/18 03/11/19 Yes Lennette BihariKelly, Thomas A, MD  triamterene-hydrochlorothiazide (MAXZIDE-25) 37.5-25 MG per tablet Take 1 tablet by mouth every morning.   Yes [provider]   Physical Exam: Blood pressure (!) 124/53, pulse 71, temperature 98.3 F (36.8 C), resp. rate 17, height 5\' 1"  (1.549 m), weight 86 kg, SpO2 92 %. Vitals:   01/31/2019 1833  BP: (!) 124/53  Pulse: 71  Resp: 17  Temp: 98.3 F (36.8 C)  SpO2: 92%     1. General:  in No Acute distress    Chronically ill -appearing  2. Psychological: Alert and  Oriented  3. Head/ENT:    Dry Mucous Membranes                           Head Non traumatic, neck supple                            Poor Dentition  4. SKIN:   decreased Skin turgor,  Skin clean Dry and intact no rash  5. Heart: Regular rate and rhythm no Murmur, no Rub or gallop  6. Lungs:   no wheezes or crackles    7. Abdomen: Soft,   non-tender, Non distended  obese  bowel sounds present  8. Lower extremities: no clubbing, cyanosis, or edema  9. Neurologically Grossly intact, moving all 4 extremities equally   10. MSK: Normal range of motion right elbow in a cast  Labs on Admission:  Basic Metabolic Panel: Recent Labs  Lab 01/21/19 1324  NA 140  K 3.7  CL 97*  CO2 29  GLUCOSE 114*  BUN 21  CREATININE 0.88  CALCIUM 9.5   Liver Function Tests: No results for input(s): AST, ALT, ALKPHOS, BILITOT,  PROT, ALBUMIN in the last 168 hours. No results for input(s): LIPASE, AMYLASE in  the last 168 hours. No results for input(s): AMMONIA in the last 168 hours. CBC: Recent Labs  Lab 01/21/19 1324  WBC 11.5*  HGB 9.9*  HCT 31.9*  MCV 99.1  PLT 578*   Cardiac Enzymes: No results for input(s): CKTOTAL, CKMB, CKMBINDEX, TROPONINI in the last 168 hours. BNP: Invalid input(s): POCBNP CBG: Recent Labs  Lab 02/09/2019 1141 02/15/2019 1339 01/23/2019 1835  GLUCAP 111* 102* 173*    Radiological Exams on Admission: No results found.     Time spent: 65 min  Therisa Doyne [facility] Pager [phone]  If 7PM-7AM, please contact night-coverage [email or web address] Password [facility] 01/26/2019, 12:29 AM

## 2019-01-25 NOTE — Anesthesia Postprocedure Evaluation (Signed)
Anesthesia Post Note  Patient: Angelica [name]  Procedure(s) Performed: Right elbow open reduction internal fixation with olecranon osteotomy and ulnar nerve decompression (Right Elbow)     Patient location during evaluation: PACU Anesthesia Type: General Level of consciousness: awake Pain management: pain level controlled Vital Signs Assessment: post-procedure vital signs reviewed and stable Respiratory status: spontaneous breathing Cardiovascular status: stable Postop Assessment: no apparent nausea or vomiting Anesthetic complications: no    Last Vitals:  Vitals:   02/07/2019 1833 02/14/2019 1848  BP: (!) 124/53 (!) 147/57  Pulse: 71 69  Resp: 17 15  Temp: 36.8 C   SpO2: 92% 93%    Last Pain:  Vitals:   01/20/2019 1833  PainSc: 0-No pain                 Arina Torry

## 2019-01-25 NOTE — Op Note (Signed)
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 with pain and deformity  Postop diagnosis the same  Procedure: #1 olecranon osteotomy right elbow #2 ulnar nerve decompression and anterior transposition right elbow #3 open reduction internal fixation non-union supracondylar humerus fracture with Biomet medial and lateral plates utilizing the Alps set #4 AP lateral and oblique x-rays performed examined and interpreted by myself right elbow and forearm  Surgeon  Lacorey Brusca MD  Anesthesia General with preoperative block  Estimated blood loss less than 100 cc  Drains 1  Indications for the procedure this patient is a 73 year old female with multiple medical problems who presents for evaluation and definitive surgical treatment.  I discussed all issues risk and benefits and surgical options.  With this in mind we will proceed accordingly.  She does give me some cause for concern given her medical problems and other issues as noted to the family.  Nevertheless I do feel that our approach and steps should include attempted fixation to try and give her back her quality of life.  She understands this and desires to proceed.  I discussed with her all issues risk and benefits including risk of infection bleeding anesthesia damage to normal structures and failure of surgery to accomplish its intended goals of relieving symptoms and restoring function.  With this in mind she desires to proceed.  This is an established nonunion.  Surgical procedure patient was seen by myself and anesthesia taken to the operative theater and underwent a smooth induction of general anesthesia she was placed in a sloppy lateral position.  Sequential compression devices placed and body parts well-padded beanbag was insufflated and an axillary roll was placed.  Following this I personally prepped the arm with Hibiclens scrub followed by 10-minute surgical Betadine  scrub followed by sterile field being secured.  Collier Flowers was placed tourniquet insufflated in the form of a sterile tourniquet and a posterior utilitarian midline incision was made dissection was carried down and the ulnar nerve underwent decompression about the arcade of Struthers medial intermuscular septum 2 heads of the FCU Osborne's ligament and the cubital tunnel.  It was mobilized and transferred into the anterior position.  A vessel loop was placed around it and it was protected at all times during the operation.  Following this I placed a guidewire for a cannulated screw from the Biomet set 6.5 in nature down the shaft of the olecranon/ulna bone.  The patient then had x-ray brought in I verify correct osteotomy position performed an osteotomy with an oscillating saw in the near cortex followed by removing the guidewire and performing the far osteotomy with a osteotome.  Triceps and the osteotomy were then reflected back with sharp and dull dissection.  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  This was removed cleaned up and I spent a great deal of time at this juncture preparing to to bony ends.  I was able to repair the bony ends to good fresh bone with excellent bleeding and punctate and punctate surfaces for surgical healing.  Following this I performed provisional reduction followed by application of Biomet medial and lateral plates.  I chose a parallel construct as I feel this is absolutely the most strong and sturdy option for her.  Screws were placed according to standard operating technique.  I used  AO technique for the insertion of the medial and lateral plates without difficulty and achieved excellent fixation with  coaptation of the bones under compression.  The patient tolerated this well.  Following this I irrigated copiously and then performed irrigation and bone grafting with the chips of bone that I use during the  takedown procedure.  Following this I then very carefully and cautiously prepared a cable through the ulnar shaft with drill bit followed by placement of a cannulated 6.5 millimeter screw 100 mm with washer and threaded the cable against the back end of the olecranon and then tensioned this with cable crimper and device for a figure-of-eight tension band construct.  This looked great.  She had excellent range of motion no complications 5 view radiographic series looked excellent and she had good stability I then closed the triceps fascia and then placed the ulnar nerve in an anterior position with a fascial sling without difficulty utilizing Vicryl suture.  I did not leave the nerve in its prior location but chose transposition.  The nerve looked well at the conclusion of the procedure.  Following this additional irrigation was applied followed by placement of a Hemovac drain followed by placement of a closure in the subcu with 4-0 Vicryl followed by 3-0 Prolene in the skin edge.  Patient had a long-arm splint applied and was taken to recovery room.  Going forward I will rehab her according to our standard postop protocol and asked that she have a bone stimulator placed.  I discussed with her all issues plans concerns and do's and don'ts.  Is a pleasure to see her today we will asked medicine to be on board with her treatment while she is in the hospital and continue to watch her very closely.  The family is aware of all concerns and plans.  Pleasure to see her today Amanda Pea MD

## 2019-01-25 NOTE — Anesthesia Procedure Notes (Signed)
Procedure Name: Intubation Date/Time: 01/28/2019 3:10 PM Performed by: Mayer Camel, CRNA Pre-anesthesia Checklist: Patient identified, Emergency Drugs available, Suction available and Patient being monitored Patient Re-evaluated:Patient Re-evaluated prior to induction Oxygen Delivery Method: Circle System Utilized Preoxygenation: Pre-oxygenation with 100% oxygen Induction Type: IV induction Ventilation: Mask ventilation without difficulty Laryngoscope Size: Miller and 2 Grade View: Grade I Tube type: Oral Tube size: 7.0 mm Number of attempts: 1 Airway Equipment and Method: Stylet and Oral airway Placement Confirmation: ETT inserted through vocal cords under direct vision,  positive ETCO2 and breath sounds checked- equal and bilateral Secured at: 21 cm Tube secured with: Tape Dental Injury: Teeth and Oropharynx as per pre-operative assessment

## 2019-01-26 DIAGNOSIS — Z515 Encounter for palliative care: Secondary | ICD-10-CM | POA: Diagnosis not present

## 2019-01-26 DIAGNOSIS — J449 Chronic obstructive pulmonary disease, unspecified: Secondary | ICD-10-CM | POA: Diagnosis not present

## 2019-01-26 DIAGNOSIS — N179 Acute kidney failure, unspecified: Secondary | ICD-10-CM | POA: Diagnosis present

## 2019-01-26 DIAGNOSIS — W19XXXA Unspecified fall, initial encounter: Secondary | ICD-10-CM | POA: Diagnosis present

## 2019-01-26 DIAGNOSIS — G936 Cerebral edema: Secondary | ICD-10-CM | POA: Diagnosis not present

## 2019-01-26 DIAGNOSIS — J9602 Acute respiratory failure with hypercapnia: Secondary | ICD-10-CM | POA: Diagnosis not present

## 2019-01-26 DIAGNOSIS — E875 Hyperkalemia: Secondary | ICD-10-CM | POA: Diagnosis present

## 2019-01-26 DIAGNOSIS — E870 Hyperosmolality and hypernatremia: Secondary | ICD-10-CM | POA: Diagnosis not present

## 2019-01-26 DIAGNOSIS — G8194 Hemiplegia, unspecified affecting left nondominant side: Secondary | ICD-10-CM | POA: Diagnosis not present

## 2019-01-26 DIAGNOSIS — D72829 Elevated white blood cell count, unspecified: Secondary | ICD-10-CM | POA: Diagnosis not present

## 2019-01-26 DIAGNOSIS — G8191 Hemiplegia, unspecified affecting right dominant side: Secondary | ICD-10-CM | POA: Diagnosis not present

## 2019-01-26 DIAGNOSIS — G460 Middle cerebral artery syndrome: Secondary | ICD-10-CM | POA: Diagnosis present

## 2019-01-26 DIAGNOSIS — Z66 Do not resuscitate: Secondary | ICD-10-CM | POA: Diagnosis not present

## 2019-01-26 DIAGNOSIS — Z9911 Dependence on respirator [ventilator] status: Secondary | ICD-10-CM | POA: Diagnosis not present

## 2019-01-26 DIAGNOSIS — I639 Cerebral infarction, unspecified: Secondary | ICD-10-CM | POA: Diagnosis not present

## 2019-01-26 DIAGNOSIS — E785 Hyperlipidemia, unspecified: Secondary | ICD-10-CM | POA: Diagnosis present

## 2019-01-26 DIAGNOSIS — J9600 Acute respiratory failure, unspecified whether with hypoxia or hypercapnia: Secondary | ICD-10-CM | POA: Diagnosis not present

## 2019-01-26 DIAGNOSIS — D509 Iron deficiency anemia, unspecified: Secondary | ICD-10-CM | POA: Diagnosis not present

## 2019-01-26 DIAGNOSIS — R296 Repeated falls: Secondary | ICD-10-CM | POA: Diagnosis present

## 2019-01-26 DIAGNOSIS — S42411A Displaced simple supracondylar fracture without intercondylar fracture of right humerus, initial encounter for closed fracture: Secondary | ICD-10-CM | POA: Diagnosis present

## 2019-01-26 DIAGNOSIS — I63411 Cerebral infarction due to embolism of right middle cerebral artery: Secondary | ICD-10-CM | POA: Diagnosis not present

## 2019-01-26 DIAGNOSIS — E872 Acidosis: Secondary | ICD-10-CM | POA: Diagnosis not present

## 2019-01-26 DIAGNOSIS — I129 Hypertensive chronic kidney disease with stage 1 through stage 4 chronic kidney disease, or unspecified chronic kidney disease: Secondary | ICD-10-CM | POA: Diagnosis present

## 2019-01-26 DIAGNOSIS — E860 Dehydration: Secondary | ICD-10-CM | POA: Diagnosis present

## 2019-01-26 DIAGNOSIS — J9621 Acute and chronic respiratory failure with hypoxia: Secondary | ICD-10-CM | POA: Diagnosis not present

## 2019-01-26 DIAGNOSIS — S42401A Unspecified fracture of lower end of right humerus, initial encounter for closed fracture: Secondary | ICD-10-CM | POA: Diagnosis present

## 2019-01-26 DIAGNOSIS — J690 Pneumonitis due to inhalation of food and vomit: Secondary | ICD-10-CM | POA: Diagnosis not present

## 2019-01-26 DIAGNOSIS — I10 Essential (primary) hypertension: Secondary | ICD-10-CM | POA: Diagnosis not present

## 2019-01-26 DIAGNOSIS — G9349 Other encephalopathy: Secondary | ICD-10-CM | POA: Diagnosis not present

## 2019-01-26 DIAGNOSIS — S42401K Unspecified fracture of lower end of right humerus, subsequent encounter for fracture with nonunion: Secondary | ICD-10-CM | POA: Diagnosis not present

## 2019-01-26 DIAGNOSIS — Z7189 Other specified counseling: Secondary | ICD-10-CM | POA: Diagnosis not present

## 2019-01-26 DIAGNOSIS — J9811 Atelectasis: Secondary | ICD-10-CM | POA: Diagnosis not present

## 2019-01-26 DIAGNOSIS — I2510 Atherosclerotic heart disease of native coronary artery without angina pectoris: Secondary | ICD-10-CM | POA: Diagnosis present

## 2019-01-26 DIAGNOSIS — N183 Chronic kidney disease, stage 3 (moderate): Secondary | ICD-10-CM | POA: Diagnosis present

## 2019-01-26 DIAGNOSIS — R29810 Facial weakness: Secondary | ICD-10-CM | POA: Diagnosis present

## 2019-01-26 DIAGNOSIS — E119 Type 2 diabetes mellitus without complications: Secondary | ICD-10-CM | POA: Diagnosis not present

## 2019-01-26 DIAGNOSIS — J95821 Acute postprocedural respiratory failure: Secondary | ICD-10-CM | POA: Diagnosis not present

## 2019-01-26 DIAGNOSIS — I6601 Occlusion and stenosis of right middle cerebral artery: Secondary | ICD-10-CM | POA: Diagnosis not present

## 2019-01-26 DIAGNOSIS — J9601 Acute respiratory failure with hypoxia: Secondary | ICD-10-CM | POA: Diagnosis not present

## 2019-01-26 LAB — CBC WITH DIFFERENTIAL/PLATELET
Abs Immature Granulocytes: 0.12 10*3/uL — ABNORMAL HIGH (ref 0.00–0.07)
BASOS PCT: 0 %
Basophils Absolute: 0 10*3/uL (ref 0.0–0.1)
Eosinophils Absolute: 0.1 10*3/uL (ref 0.0–0.5)
Eosinophils Relative: 0 %
HCT: 29.7 % — ABNORMAL LOW (ref 36.0–46.0)
Hemoglobin: 9.3 g/dL — ABNORMAL LOW (ref 12.0–15.0)
Immature Granulocytes: 1 %
Lymphocytes Relative: 5 %
Lymphs Abs: 0.9 10*3/uL (ref 0.7–4.0)
MCH: 30.6 pg (ref 26.0–34.0)
MCHC: 31.3 g/dL (ref 30.0–36.0)
MCV: 97.7 fL (ref 80.0–100.0)
Monocytes Absolute: 1 10*3/uL (ref 0.1–1.0)
Monocytes Relative: 6 %
NEUTROS ABS: 16.2 10*3/uL — AB (ref 1.7–7.7)
Neutrophils Relative %: 88 %
Platelets: 576 10*3/uL — ABNORMAL HIGH (ref 150–400)
RBC: 3.04 MIL/uL — ABNORMAL LOW (ref 3.87–5.11)
RDW: 14.1 % (ref 11.5–15.5)
WBC: 18.3 10*3/uL — ABNORMAL HIGH (ref 4.0–10.5)
nRBC: 0 % (ref 0.0–0.2)

## 2019-01-26 LAB — BASIC METABOLIC PANEL
Anion gap: 14 (ref 5–15)
BUN: 34 mg/dL — ABNORMAL HIGH (ref 8–23)
CO2: 27 mmol/L (ref 22–32)
Calcium: 9.7 mg/dL (ref 8.9–10.3)
Chloride: 94 mmol/L — ABNORMAL LOW (ref 98–111)
Creatinine, Ser: 1.68 mg/dL — ABNORMAL HIGH (ref 0.44–1.00)
GFR calc Af Amer: 35 mL/min — ABNORMAL LOW (ref >60)
GFR calc non Af Amer: 30 mL/min — ABNORMAL LOW (ref >60)
Glucose, Bld: 210 mg/dL — ABNORMAL HIGH (ref 70–99)
Potassium: 4.7 mmol/L (ref 3.5–5.1)
Sodium: 135 mmol/L (ref 135–145)

## 2019-01-26 LAB — GLUCOSE, CAPILLARY
Glucose-Capillary: 191 mg/dL — ABNORMAL HIGH (ref 70–99)
Glucose-Capillary: 183 mg/dL — ABNORMAL HIGH (ref 70–99)
Glucose-Capillary: 187 mg/dL — ABNORMAL HIGH (ref 70–99)
Glucose-Capillary: 172 mg/dL — ABNORMAL HIGH (ref 70–99)
Glucose-Capillary: 167 mg/dL — ABNORMAL HIGH (ref 70–99)

## 2019-01-26 MED ORDER — VANCOMYCIN HCL IN DEXTROSE 1-5 GM/200ML-% IV SOLN
1000.0000 mg | INTRAVENOUS | Status: DC
  Administered 2019-01-26: 1000 mg via INTRAVENOUS
  Filled 2019-01-26: qty 200

## 2019-01-26 MED ORDER — TORSEMIDE 20 MG PO TABS: 10.0000 mg | ORAL_TABLET | Freq: Every day | ORAL | Status: DC

## 2019-01-26 NOTE — Progress Notes (Signed)
Patient ID: Angelica [name], female   DOB: [birth year], 73 y.o.   MRN: [id number] Patient seen and examined status post ORIF supracondylar humerus fracture nonunion.  She looks quite well postop day 1.  She is on vancomycin.  I discussed with pharmacy had like to continue the vancomycin until afternoon on Sunday as we are going to plan for likely discharge Monday.  Drain is removed.  She is ambulatory and voiding well.  She has no signs of infection dystrophic reaction and remains neurovascularly intact about the arm.  We will get a continue her splint for the next 2 weeks then begin some gradual interval motion measures in my office.  We will have therapy continue to work with her.  I spoke with the family who plan to take her home and help her as she has been staying with them.  If therapy feels appropriate then perhaps we can consider some home health avenues for them.  I discussed with her precautions.  Lower extremity examination is benign there is no signs of DVT.  Left upper extremity is stable.  She has no shortness of breath and her abdomen is nontender.  We will continue close observatory care.  Appreciate consultants help  Good course so far  Daeshawn Redmann MD

## 2019-01-26 NOTE — Addendum Note (Signed)
Addendum  created 01/26/19 0825 by Bethena Midget, MD   Attestation recorded in Intraprocedure, Intraprocedure Attestations filed

## 2019-01-26 NOTE — Progress Notes (Signed)
Pharmacy Antibiotic Note  Angelica [name] is a 73 y.o. female admitted on 01/26/2019 with nonunion supracondylar humerus fracture RUE.  Pharmacy has been consulted for vancomycin dosing for post-op prophylaxis.   SCr has bumped up today to 1.68 << 1.02, CrCl~30 ml/min. Will empirically reduce the Vancomycin dose. Clarified intended LOT with ortho (Gramig) who wants Vancomycin to continue thru 2/9.  Plan: - Adjust Vancomycin to 1g IV every 24 hours - stop date 2/9 - Will continue to follow renal function, culture results, LOT, and antibiotic de-escalation plans   Height: 5\' 1"  (154.9 cm) Weight: 189 lb 9.5 oz (86 kg) IBW/kg (Calculated) : 47.8  Temp (24hrs), Avg:97.7 F (36.5 C), Min:97.2 F (36.2 C), Max:98.3 F (36.8 C)  Recent Labs  Lab 01/21/19 1324 01/24/2019 2255 01/26/19 0337 01/26/19 0852  WBC 11.5* 19.0* 18.3*  --   CREATININE 0.88 1.02*  --  1.68*    Estimated Creatinine Clearance: 29.7 mL/min (A) (by C-G formula based on SCr of 1.68 mg/dL (H)).    Allergies  Allergen Reactions  . Bee Venom Anaphylaxis  . Levaquin [Levofloxacin] Anaphylaxis and Itching  . Amoxicillin-Pot Clavulanate Other (See Comments)    Upset GI Did it involve swelling of the face/tongue/throat, SOB, or low BP? No Did it involve sudden or severe rash/hives, skin peeling, or any reaction on the inside of your mouth or nose? No Did you need to seek medical attention at a hospital or doctor's office? No When did it last happen?10-20 years If all above answers are "NO", may proceed with cephalosporin use.   Marland Kitchen Morphine And Related Rash  . Rocephin [Ceftriaxone Sodium In Dextrose] Rash and Other (See Comments)  . Latex Itching and Swelling  . Quinine Derivatives     Decreased platelet counts     Thank you for allowing pharmacy to be a part of this patient's care.  Georgina Pillion, PharmD, BCPS Clinical Pharmacist Clinical phone for 01/26/2019: [phone] 01/26/2019 1:03 PM   **Pharmacist  phone directory can now be found on [email or web address] ([email or web address]).  Listed under [facility].

## 2019-01-26 NOTE — Progress Notes (Addendum)
Physical Therapy Evaluation Patient Details Name: Angelica [name] MRN: [id number] DOB: February 14, 1946 Today's Date: 01/26/2019   History of Present Illness  73 y.o. female admitted on 02/02/2019 for known nonunion supracondylar R humerus fx (fx from a fall earlier fall 2019 -splinted and non surgical management failed) s/p ORIF on 02/13/2019.  PMH significant for HTN, DM, CAD, COPD, asthma, foot surgery, back surgery, and L TKA.  Clinical Impression  Pt was able to get up and walk down the hallway and into the bathroom with min hand held assist.  Pt is wobbly on her feet, but this is baseline as she has significant h/o falls.  Daughter was present and supportive and went through this with her mother's initial fall.  Pt would benefit from home therapy follow up for gait, mobility and balance training.   PT to follow acutely for deficits listed below.      Follow Up Recommendations Home health PT;Supervision for mobility/OOB    Equipment Recommendations  None recommended by PT    Recommendations for Other Services   NA    Precautions / Restrictions Precautions Precautions: Fall Precaution Comments: h/o falls Required Braces or Orthoses: Sling Restrictions Weight Bearing Restrictions: Yes RUE Weight Bearing: Non weight bearing(presumed NWB, no orders)      Mobility  Bed Mobility Overal bed mobility: Needs Assistance Bed Mobility: Supine to Sit     Supine to sit: Min assist;HOB elevated     General bed mobility comments: Min assist to come to EOB with HOB elevated and pt pulling on rail with her left hand.  Assist needed to support trunk to prevent posterior LOB during transition to sitting EOB.   Transfers Overall transfer level: Needs assistance Equipment used: 1 person hand held assist Transfers: Sit to/from Stand Sit to Stand: Min assist         General transfer comment: Min hand held assist to stand from bed and 3-in-1 in bathroom.  Pt needed assist more for steadying than  powering up.    Ambulation/Gait Ambulation/Gait assistance: Min assist Gait Distance (Feet): 100 Feet Assistive device: 1 person hand held assist Gait Pattern/deviations: Step-through pattern;Staggering left;Staggering right Gait velocity: decreased Gait velocity interpretation: 1.31 - 2.62 ft/sec, indicative of limited community ambulator General Gait Details: Pt with mildly staggering gait pattern, no signs of instability at her left leg more than her right leg, but kept a close eye on her.  Assist needed mostly for balance, cues to make sure she clears her right arm around obstacles like the door frame.          Balance Overall balance assessment: Needs assistance Sitting-balance support: Feet supported;No upper extremity supported Sitting balance-Leahy Scale: Good Sitting balance - Comments: supervision EOB   Standing balance support: Single extremity supported Standing balance-Leahy Scale: Poor Standing balance comment: needs external support for balance in standing.                              Pertinent Vitals/Pain Pain Assessment: Faces Faces Pain Scale: Hurts whole lot Pain Location: right elbow and wrist Pain Descriptors / Indicators: Aching Pain Intervention(s): Limited activity within patient's tolerance;Monitored during session;Repositioned    Home Living Family/patient expects to be discharged to:: Private residence Living Arrangements: Children(daughter and son in Social worker) Available Help at Discharge: Family Type of Home: House Home Access: Stairs to enter   Secretary/administrator of Steps: 1 Home Layout: One level Home Equipment: Environmental consultant - 4 wheels;Cane -  single point;Bedside commode;Shower seat;Other (comment)(lift chair) Additional Comments: has recently been sleeping in her lift chair    Prior Function Level of Independence: Needs assistance   Gait / Transfers Assistance Needed: generally can get about the house short distances  unassisted  ADL's / Homemaking Assistance Needed: daughter helps her get into the shower and assists while showering.         Hand Dominance   Dominant Hand: Left(but admitts to being a bit ambidextrious)    Extremity/Trunk Assessment   Upper Extremity Assessment Upper Extremity Assessment: Defer to OT evaluation    Lower Extremity Assessment Lower Extremity Assessment: LLE deficits/detail LLE Deficits / Details: left leg often gives way on her and is the side that was affected by her back issues as well as her TKA side.  She is mildly weaker when tested, but still at least 4/5.  Sensation is intact in bil LEs.  LLE Sensation: WNL    Cervical / Trunk Assessment Cervical / Trunk Assessment: Other exceptions Cervical / Trunk Exceptions: h/o lumbar surgery x 2  Communication   Communication: No difficulties  Cognition Arousal/Alertness: Lethargic;Suspect due to medications [facility] tasks assessed/performed Overall Cognitive Status: Within Functional Limits for tasks assessed                                           Exercises Other Exercises Other Exercises: encouraged finger wiggles and ankle pumps, 20 per hour.     Assessment/Plan    PT Assessment Patient needs continued PT services  PT Problem List Decreased strength;Decreased range of motion;Decreased activity tolerance;Decreased balance;Decreased mobility;Decreased knowledge of use of DME;Decreased knowledge of precautions;Pain;Obesity       PT Treatment Interventions DME instruction;Gait training;Stair training;Functional mobility training;Therapeutic activities;Therapeutic exercise;Balance training;Patient/family education;Modalities    PT Goals (Current goals can be found in the Care Plan section)  Acute Rehab PT Goals Patient Stated Goal: to get stronger and more balanced on her feet, decrease pain, to get back to living at her own home by Odis LusterEaster PT Goal Formulation:  With patient/family Time For Goal Achievement: 02/09/19 Potential to Achieve Goals: Good    Frequency Min 5X/week    AM-PAC PT "6 Clicks" Mobility  Outcome Measure Help needed turning from your back to your side while in a flat bed without using bedrails?: A Little Help needed moving from lying on your back to sitting on the side of a flat bed without using bedrails?: A Little Help needed moving to and from a bed to a chair (including a wheelchair)?: A Little Help needed standing up from a chair using your arms (e.g., wheelchair or bedside chair)?: A Little Help needed to walk in hospital room?: A Little Help needed climbing 3-5 steps with a railing? : A Little 6 Click Score: 18    End of Session Equipment Utilized During Treatment: Gait belt;Other (comment)(right arm sling) Activity Tolerance: Patient limited by pain;Patient limited by fatigue Patient left: in chair;with call bell/phone within reach;with family/visitor present   PT Visit Diagnosis: Muscle weakness (generalized) (M62.81);Difficulty in walking, not elsewhere classified (R26.2);Pain;History of falling (Z91.81) Pain - Right/Left: Right Pain - part of body: Arm    Time: [id number] PT Time Calculation (min) (ACUTE ONLY): 39 min   Charges:           Lurena Joinerebecca B. Aaliyah Cancro, PT, DPT  Acute Rehabilitation [phone] pager [phone]  office   PT Evaluation $PT Eval Moderate Complexity: 1 Mod PT Treatments $Gait Training: 8-22 mins $Therapeutic Activity: 8-22 mins       01/26/2019, 11:36 AM

## 2019-01-26 NOTE — Progress Notes (Signed)
PROGRESS NOTE    Angelica [name]  ZOX:[id number] DOB: 07/22/1946 DOA: 02/13/2019 PCP: Joette CatchingNyland, Leonard, MD   Brief Narrative:  Consulted for hyperkalemia. Patient with a history of diabetes, coronary artery disease, COPD, asthma, hypertension, iron deficiency anemia, presented for right elbow repair.  Recently undergone construction by Dr. Amanda PeaGramig secondary to nonunion supracondylar humerus fracture. Assessment & Plan   Hyperkalemia -Potassium is mildly elevated, 5.1.  Has resolved to 4.7 today. 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  COPD -Currently stable, continue home medications and nebulizers  Iron deficiency anemia -Hemoglobin currently stable, 9.3, continue to monitor CBC  Essential hypertension -Blood pressure stable, continue to monitor -Continue bisoprolol -Losartan held  Nonunion supracondylar humerus fracture, right upper extremity -Status post surgery, per primary, Dr. Amanda PeaGramig -Continue pain control -PT and OT recommending home health -Currently on vancomycin per orthopedics  Diabetes mellitus, type II -Home medications held continue insulin sliding scale with CBG monitoring  Leukocytosis -Suspect secondary to the above, continue to monitor  DVT Prophylaxis  SCDs  Code Status: Full  Family Communication: Daughter at bedside  Disposition Plan: Currently in observation. Disposition likely home with home health on 2/10 per primary  Consultants TR[facility]  Procedures  ORIF supracondylar humerus fracture nonunion.  Antibiotics   Anti-infectives (From admission, onward)   Start     Dose/Rate Route Frequency Ordered Stop   01/26/19 1200  vancomycin (VANCOCIN) IVPB 1000 mg/200 mL premix     1,000 mg 200 mL/hr over 60 Minutes Intravenous Every 24 hours 01/26/19 1016 01/27/19 2359   01/26/19 1000  vancomycin (VANCOCIN) 1,500 mg in  sodium chloride 0.9 % 500 mL IVPB  Status:  Discontinued     1,500 mg 250 mL/hr over 120 Minutes Intravenous Every 24 hours 01/20/2019 2021 01/26/19 1016   01/27/2019 1130  vancomycin (VANCOCIN) IVPB 1000 mg/200 mL premix     1,000 mg 200 mL/hr over 60 Minutes Intravenous On call to O.R. 01/31/2019 1118 02/03/2019 1244      Subjective:   Angelica [name] seen and examined today.  Patient has no complaints today.  Denies current chest pain, shortness breath, domino pain, nausea vomiting, diarrhea constipation, dizziness or headache.    Objective:   Vitals:   01/26/19 0506 01/26/19 0936 01/26/19 1133 01/26/19 1223  BP: 123/69   (!) 102/58  Pulse: 76   74  Resp:    16  Temp: 97.7 F (36.5 C)     TempSrc: Oral     SpO2: 95% 97% (!) 88% (!) 88%  Weight:      Height:        Intake/Output Summary (Last 24 hours) at 01/26/2019 1329 Last data filed at 01/26/2019 0900 Gross per 24 hour  Intake 1340.83 ml  Output 70 ml  Net 1270.83 ml   Filed Weights   01/23/2019 1139  Weight: 86 kg    Exam  General: Well developed, elderly, NAD  HEENT: NCAT, mucous membranes moist.   Neck: Supple  Cardiovascular: S1 S2 auscultated, RRR  Respiratory: Clear to auscultation bilaterally with equal chest rise  Abdomen: Soft, nontender, nondistended, + bowel sounds  Extremities: warm dry without cyanosis clubbing or edema  Neuro: AAOx3, nonfocal  Psych: Pleasant, appropriate mood and affect   Data Reviewed: I have personally reviewed following labs and imaging studies  CBC: Recent Labs  Lab 01/21/19 1324 01/31/2019 2255 01/26/19 0337  WBC 11.5*  19.0* 18.3*  NEUTROABS  --   --  16.2*  HGB 9.9* 9.9* 9.3*  HCT 31.9* 31.9* 29.7*  MCV 99.1 96.7 97.7  PLT 578* 529* 576*   Basic Metabolic Panel: Recent Labs  Lab 01/21/19 1324  2255 01/26/19 0852  NA 140 137 135  K 3.7 5.2* 4.7  CL 97* 96* 94*  CO2 29 27 27   GLUCOSE 114* 240* 210*  BUN 21 28* 34*  CREATININE 0.88 1.02* 1.68*    CALCIUM 9.5 9.9 9.7   GFR: Estimated Creatinine Clearance: 29.7 mL/min (A) (by C-G formula based on SCr of 1.68 mg/dL (H)). Liver Function Tests: Recent Labs  Lab 02/15/2019 2255  AST 16  ALT 15  ALKPHOS 37*  BILITOT 0.7  PROT 7.0  ALBUMIN 3.1*   No results for input(s): LIPASE, AMYLASE in the last 168 hours. No results for input(s): AMMONIA in the last 168 hours. Coagulation Profile: No results for input(s): INR, PROTIME in the last 168 hours. Cardiac Enzymes: No results for input(s): CKTOTAL, CKMB, CKMBINDEX, TROPONINI in the last 168 hours. BNP (last 3 results) No results for input(s): PROBNP in the last 8760 hours. HbA1C: Recent Labs    01/20/2019 2255  HGBA1C 6.4*   CBG: Recent Labs  Lab 02/13/2019 1339 02/15/2019 1835 01/26/19 0626 01/26/19 0827 01/26/19 1130  GLUCAP 102* 173* 191* 183* 187*   Lipid Profile: No results for input(s): CHOL, HDL, LDLCALC, TRIG, CHOLHDL, LDLDIRECT in the last 72 hours. Thyroid Function Tests: No results for input(s): TSH, T4TOTAL, FREET4, T3FREE, THYROIDAB in the last 72 hours. Anemia Panel: No results for input(s): VITAMINB12, FOLATE, FERRITIN, TIBC, IRON, RETICCTPCT in the last 72 hours. Urine analysis:    Component Value Date/Time   COLORURINE YELLOW 11/28/2011 0013   APPEARANCEUR CLEAR 11/28/2011 0013   LABSPEC 1.010 11/28/2011 0013   PHURINE 6.0 11/28/2011 0013   GLUCOSEU NEGATIVE 11/28/2011 0013   HGBUR TRACE (A) 11/28/2011 0013   BILIRUBINUR NEGATIVE 11/28/2011 0013   KETONESUR NEGATIVE 11/28/2011 0013   PROTEINUR TRACE (A) 11/28/2011 0013   UROBILINOGEN 0.2 11/28/2011 0013   NITRITE NEGATIVE 11/28/2011 0013   LEUKOCYTESUR NEGATIVE 11/28/2011 0013   Sepsis Labs: @LABRCNTIP (procalcitonin:4,lacticidven:4)  )No results found for this or any previous visit (from the past 240 hour(s)).    Radiology Studies: No results found.   Scheduled Meds: . bisoprolol  5 mg Oral Daily  . cyclobenzaprine  10 mg Oral QHS  .  diltiazem  120 mg Oral QHS  . docusate sodium  100 mg Oral BID  . DULoxetine  60 mg Oral Daily  . insulin aspart  0-9 Units Subcutaneous Q4H  . mometasone-formoterol  2 puff Inhalation BID  . omega-3 acid ethyl esters  2 g Oral BID  . pantoprazole  40 mg Oral Daily  . [START ON 01/27/2019] torsemide  10 mg Oral Daily  . umeclidinium bromide  1 puff Inhalation Daily  . vitamin C  1,000 mg Oral Daily   Continuous Infusions: . lactated ringers 50 mL/hr (01/26/19 0436)  . methocarbamol (ROBAXIN) IV    . vancomycin       LOS: 1 day   Time Spent in minutes   30 minutes  Celia Friedland D.O. on 01/26/2019 at 1:29 PM  Between 7am to 7pm - Please see pager noted on [email or web address]  After 7pm go to [email or web address]  And look for the night coverage person covering for me after hours  [facility] Office  [phone]

## 2019-01-26 NOTE — Progress Notes (Signed)
Occupational Therapy Evaluation Patient Details Name: Angelica [name] MRN: [id number] DOB: 04/30/1946 Today's Date: 01/26/2019    History of Present Illness 73 y.o. female admitted on 01/31/2019 for known nonunion supracondylar R humerus fx (fx from a fall earlier fall 2019 -splinted and non surgical management failed) s/p ORIF on 02/12/2019.  PMH significant for HTN, DM, CAD, COPD, asthma, foot surgery, back surgery, and L TKA.   Clinical Impression   PTA pt required assistance with most ADLs for safety and limited function. Pt requires ample support from daughter and currently lives with her. Pt currently requires Max A for UB dressing and bathing due pain and limitations. Pt and daughter educated on edema management, ROM of hand/wrist/shoulder with handout, and sling management. Pt will benefit from continute actue OT to improve safety and adherence to precautions to maximize safety and independence to return to home setting. DC recommendation home health OT. OT will continue follow acutely.     Follow Up Recommendations  Home health OT;Supervision/Assistance - 24 hour    Equipment Recommendations  None recommended by OT    Recommendations for Other Services       Precautions / Restrictions Precautions Precautions: Fall Precaution Comments: h/o falls Required Braces or Orthoses: Sling Restrictions Weight Bearing Restrictions: Yes RUE Weight Bearing: Non weight bearing(presumed NWB, no orders)      Mobility Bed Mobility Overal bed mobility: Needs Assistance Bed Mobility: Supine to Sit     Supine to sit: Min assist;HOB elevated     General bed mobility comments: Min assist to come to EOB with HOB elevated and pt pulling on rail with her left hand.  Assist needed to support trunk to prevent posterior LOB during transition to sitting EOB.   Transfers Overall transfer level: Needs assistance Equipment used: 1 person hand held assist Transfers: Sit to/from Stand Sit to Stand: Min  assist         General transfer comment: Min hand held assist to stand from chair to bed.  Pt needed assist more for steadying than powering up.      Balance Overall balance assessment: Needs assistance Sitting-balance support: Feet supported;No upper extremity supported Sitting balance-Leahy Scale: Good Sitting balance - Comments: supervision EOB   Standing balance support: Single extremity supported Standing balance-Leahy Scale: Poor Standing balance comment: needs external support for balance in standing.                            ADL either performed or assessed with clinical judgement   ADL Overall ADL's : Needs assistance/impaired Eating/Feeding: Sitting;Set up   Grooming: Minimal assistance   Upper Body Bathing: Maximal assistance       Upper Body Dressing : Maximal assistance       Toilet Transfer: Minimal assistance Toilet Transfer Details (indicate cue type and reason): 1 hand held assist, toilet transfer simulated from chair to bed.         Functional mobility during ADLs: Minimal assistance General ADL Comments: Pt educated of ROM of hand and shoulder, as well as positioned for edema managment while in bed and in chair.      Vision         Perception     Praxis      Pertinent Vitals/Pain Pain Assessment: 0-10 Pain Score: 9  Faces Pain Scale: Hurts whole lot Pain Location: right elbow and wrist Pain Descriptors / Indicators: Aching Pain Intervention(s): Premedicated before session(Pt really drowsy)  Hand Dominance Left(but admitts to being a bit ambidextrious)   Extremity/Trunk Assessment Upper Extremity Assessment Upper Extremity Assessment: RUE deficits/detail RUE: Unable to fully assess due to immobilization   Lower Extremity Assessment Lower Extremity Assessment: Defer to PT evaluation LLE Deficits / Details: left leg often gives way on her and is the side that was affected by her back issues as well as her TKA side.   She is mildly weaker when tested, but still at least 4/5.  Sensation is intact in bil LEs.  LLE Sensation: WNL   Cervical / Trunk Assessment Cervical / Trunk Assessment: Other exceptions Cervical / Trunk Exceptions: h/o lumbar surgery x 2   Communication Communication Communication: No difficulties   Cognition Arousal/Alertness: Lethargic;Suspect due to medications Behavior During Therapy: [facility] for tasks assessed/performed Overall Cognitive Status: Within Functional Limits for tasks assessed                                     General Comments  Daughter present during session educated on ROM, edema, sling managmenet.     Exercises Exercises: Other exercises Other Exercises Other Exercises: encouraged finger wiggles and ankle pumps, 20 per hour.   Other Exercises: encouraged ROM hand, fingers, and shoulders  to decrease stiffness and to continue movement.    Shoulder Instructions      Home Living Family/patient expects to be discharged to:: Private residence Living Arrangements: Children(daughter and son in law) Available Help at Discharge: Family Type of Home: House Home Access: Stairs to enter Secretary/administrator of Steps: 1   Home Layout: One level     Bathroom Shower/Tub: Producer, television/film/video: Standard     Home Equipment: Environmental consultant - 4 wheels;Cane - single point;Bedside commode;Shower seat;Other (comment)(lift chair)   Additional Comments: has recently been sleeping in her lift chair      Prior Functioning/Environment Level of Independence: Needs assistance  Gait / Transfers Assistance Needed: generally can get about the house short distances unassisted ADL's / Homemaking Assistance Needed: daughter helps her get into the shower and assists while showering.  Communication / Swallowing Assistance Needed: NA Comments: Before fall pt was I in all ADLs until recently requiring assistance with most ADLs for safety and limited function.         OT Problem List: Decreased range of motion;Decreased knowledge of precautions;Pain      OT Treatment/Interventions: Self-care/ADL training;Therapeutic exercise;DME and/or AE instruction;Patient/family education;Balance training    OT Goals(Current goals can be found in the care plan section) Acute Rehab OT Goals Patient Stated Goal: to get stronger and more balanced on her feet, decrease pain, to get back to living at her own home by Odis Luster OT Goal Formulation: With patient/family Time For Goal Achievement: 02/09/19 Potential to Achieve Goals: Good  OT Frequency: Min 2X/week   Barriers to D/C: Inaccessible home environment  Daughter reports floor lifted in kitchen possible hazard during ambulation.        Co-evaluation              AM-PAC OT "6 Clicks" Daily Activity     Outcome Measure Help from another person eating meals?: A Little Help from another person taking care of personal grooming?: A Little Help from another person toileting, which includes using toliet, bedpan, or urinal?: A Little Help from another person bathing (including washing, rinsing, drying)?: A Lot Help from another person to put on and taking off  regular upper body clothing?: A Lot Help from another person to put on and taking off regular lower body clothing?: A Little 6 Click Score: 16   End of Session Nurse Communication: Precautions  Activity Tolerance: Patient limited by lethargy(premedicated before session) Patient left: in bed;with call bell/phone within reach;with family/visitor present  OT Visit Diagnosis: History of falling (Z91.81)                Time: 1130-1157 OT Time Calculation (min): 27 min Charges:  OT General Charges $OT Visit: 1 Visit OT Evaluation $OT Eval Low Complexity: 1 Low OT Treatments $Self Care/Home Management : 8-22 mins  Marquette OldEvan Taylyn Brame, MSOT, OTR/L  Supplemental Rehabilitation Services  [phone]  Zigmund Danielvan M Elliett Guarisco 01/26/2019, 12:04 PM

## 2019-01-27 ENCOUNTER — Other Ambulatory Visit (HOSPITAL_COMMUNITY): Payer: Medicare Other

## 2019-01-27 ENCOUNTER — Inpatient Hospital Stay (HOSPITAL_COMMUNITY): Payer: Medicare Other

## 2019-01-27 LAB — BASIC METABOLIC PANEL
Anion gap: 16 — ABNORMAL HIGH (ref 5–15)
BUN: 44 mg/dL — AB (ref 8–23)
CO2: 28 mmol/L (ref 22–32)
CREATININE: 2.73 mg/dL — AB (ref 0.44–1.00)
Calcium: 9.1 mg/dL (ref 8.9–10.3)
Chloride: 90 mmol/L — ABNORMAL LOW (ref 98–111)
GFR calc Af Amer: 19 mL/min — ABNORMAL LOW (ref >60)
GFR calc non Af Amer: 17 mL/min — ABNORMAL LOW (ref >60)
Glucose, Bld: 231 mg/dL — ABNORMAL HIGH (ref 70–99)
Potassium: 3.9 mmol/L (ref 3.5–5.1)
Sodium: 134 mmol/L — ABNORMAL LOW (ref 135–145)

## 2019-01-27 LAB — GLUCOSE, CAPILLARY
Glucose-Capillary: 148 mg/dL — ABNORMAL HIGH (ref 70–99)
Glucose-Capillary: 159 mg/dL — ABNORMAL HIGH (ref 70–99)
Glucose-Capillary: 164 mg/dL — ABNORMAL HIGH (ref 70–99)
Glucose-Capillary: 178 mg/dL — ABNORMAL HIGH (ref 70–99)
Glucose-Capillary: 191 mg/dL — ABNORMAL HIGH (ref 70–99)
Glucose-Capillary: 224 mg/dL — ABNORMAL HIGH (ref 70–99)

## 2019-01-27 LAB — CBC
HCT: 28.1 % — ABNORMAL LOW (ref 36.0–46.0)
Hemoglobin: 8.6 g/dL — ABNORMAL LOW (ref 12.0–15.0)
MCH: 30.4 pg (ref 26.0–34.0)
MCHC: 30.6 g/dL (ref 30.0–36.0)
MCV: 99.3 fL (ref 80.0–100.0)
Platelets: 514 10*3/uL — ABNORMAL HIGH (ref 150–400)
RBC: 2.83 MIL/uL — ABNORMAL LOW (ref 3.87–5.11)
RDW: 14.3 % (ref 11.5–15.5)
WBC: 12.3 10*3/uL — ABNORMAL HIGH (ref 4.0–10.5)
nRBC: 0 % (ref 0.0–0.2)

## 2019-01-27 LAB — OSMOLALITY: OSMOLALITY: 292 mosm/kg (ref 275–295)

## 2019-01-27 MED ORDER — ALUM & MAG HYDROXIDE-SIMETH 200-200-20 MG/5ML PO SUSP
30.0000 mL | Freq: Four times a day (QID) | ORAL | Status: DC | PRN
  Administered 2019-01-27: 30 mL via ORAL
  Filled 2019-01-27: qty 30

## 2019-01-27 MED ORDER — SODIUM CHLORIDE 0.9 % IV SOLN
INTRAVENOUS | Status: DC
  Administered 2019-01-27: via INTRAVENOUS
  Administered 2019-01-27: 75 mL/h via INTRAVENOUS

## 2019-01-27 NOTE — Progress Notes (Addendum)
Patient ID: Angelica [name], female   DOB: 06-Sep-1946, 73 y.o.   MRN: [id number] Patient seen and evaluated at bedside.  Patient states she has mild soreness in her elbow as expected.  She is tolerating her diet.  I got her about the bed and walked her to the chair.  She demonstrates good volitional lower extremity control with assistance.  She is not dizzy.  She states she feels hot but has no other complaints.  Her kidney functions have risen in terms of her creatinine and BUN levels.  I discussed this with the hospitalist team.  At present time we will stop any further antibiotics and wants this.  Additional recommendations per medicine.  Left arm and lower extremity examination is stable.  Abdomen is stable.  She has no shortness of breath.  At present juncture I do feel she would be stable for discharge home tomorrow if her kidney functions normalized off the antibiotics.  Hopefully this is simply a increase due to the antibiotic administration postoperatively.  We will see how she does over the course of the next 24 hours.  I have discussed with she and her family all issues plans and concerns.  We discussed the kidney function elevation and the plans for home health if approved as well.  No signs of DVT or urinary tract infection (no burning or dysuria)-we will await UA which is been ordered.  We will also await ultrasound of her kidney.  Hopefully her labs will improve.  Vlad Mayberry MD

## 2019-01-27 NOTE — Care Management Note (Signed)
Case Management Note  Patient Details  Name: Angelica [name] MRN: [id number] Date of Birth: 01/16/1946  Subjective/Objective:         Pt to return home to daughter's house ([address], [place], [place] [address]   Daughter states that someone is with patient 24/7.  Daughter and her husband work during day and a Engineer, siteprivate sitter stays with patient while they are working.    They have a 3n1 and RW and do not need other DME.          Action/Plan: Daughter is presented with Medicare rated HH list and [facility]  [facility]  Expected Discharge Date:       01/28/2019           Expected Discharge Plan:  Home w Home Health Services  In-House Referral:  NA  Discharge planning Services  CM Consult  Post Acute Care Choice:  Home Health Choice offered to:  Adult Children  DME Arranged:  N/A DME Agency:  N/A  HH Arranged:  PT, OT HH Agency:  [facility]  Status of Service:  Completed, signed off  If discussed at Long Length of Stay Meetings, dates discussed:    Additional Comments:  Deveron Furlongshley  Lalla Laham, RN 01/27/2019, 4:10 PM

## 2019-01-27 NOTE — Progress Notes (Signed)
Patient requested for medication for heartburn. [facility] notified and received a new order for Mylanta suspension PRN. Order implement as received. Will continue to monitor.

## 2019-01-27 NOTE — Progress Notes (Signed)
PROGRESS NOTE    Angelica [name]  [id number] DOB: 05/02/1946 DOA: 02/04/2019 PCP: Joette CatchingNyland, Leonard, MD   Brief Narrative:  Consulted for hyperkalemia. Patient with a history of diabetes, coronary artery disease, COPD, asthma, hypertension, iron deficiency anemia, presented for right elbow repair.  Recently undergone construction by Dr. Amanda PeaGramig secondary to nonunion supracondylar humerus fracture. 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 Amanda PeaGramig -Continue pain control -PT and OT recommending home health -Currently on vancomycin per orthopedics  Diabetes mellitus, type II -Home medications held continue insulin sliding scale with CBG monitoring  Leukocytosis -Suspect secondary to the above, improving -continue to monitor  DVT Prophylaxis  SCDs  Code Status: Full  Family Communication: Daughter at bedside  Disposition Plan: Admitted. Continue to monitor BMP. Dispo per primary  Consultants TRH  Procedures  ORIF supracondylar humerus fracture nonunion.  Antibiotics   Anti-infectives (From admission, onward)   Start     Dose/Rate Route Frequency Ordered Stop   01/26/19 1200  vancomycin (VANCOCIN) IVPB 1000 mg/200 mL premix  Status:  Discontinued     1,000 mg 200 mL/hr over 60 Minutes Intravenous Every 24 hours 01/26/19 1016 01/27/19 0722   01/26/19 1000  vancomycin (VANCOCIN) 1,500 mg  in sodium chloride 0.9 % 500 mL IVPB  Status:  Discontinued     1,500 mg 250 mL/hr over 120 Minutes Intravenous Every 24 hours 01/23/2019 2021 01/26/19 1016   01/20/2019 1130  vancomycin (VANCOCIN) IVPB 1000 mg/200 mL premix     1,000 mg 200 mL/hr over 60 Minutes Intravenous On call to O.R. 01/28/2019 1118 02/12/2019 1244      Subjective:   Harless LittenVickie Plotz seen and examined today.  Patient with no complaints today. Denies chest pain, shortness of breath, abdominal pain, N/V/D/C.   Objective:   Vitals:   01/27/19 0429 01/27/19 0432 01/27/19 0803 01/27/19 1331  BP: (!) 103/43 (!) 103/43  (!) 101/55  Pulse: 69 68  73  Resp: 18  18 16   Temp: 97.6 F (36.4 C) 97.6 F (36.4 C)  (!) 97.4 F (36.3 C)  TempSrc: Oral Oral  Oral  SpO2: 94% 96%  94%  Weight:      Height:        Intake/Output Summary (Last 24 hours) at 01/27/2019 1404 Last data filed at 01/27/2019 1100 Gross per 24 hour  Intake 420 ml  Output 500 ml  Net -80 ml   Filed Weights    1139  Weight: 86 kg   Exam  General: Well developed, well nourished, NAD, appears stated age  HEENT: NCAT, mucous membranes moist.   Neck: Supple  Cardiovascular: S1 S2 auscultated, RRR  Respiratory: Clear to auscultation bilaterally with equal chest rise  Abdomen: Soft, obese, nontender, nondistended, + bowel sounds  Extremities: warm dry without cyanosis clubbing or edema  Neuro: AAOx3, nonfocal  Psych: Pleasant, Normal affect and demeanor     Data Reviewed: I have personally reviewed following labs and imaging studies  CBC: Recent Labs  Lab 01/21/19 1324 02/09/2019 2255 01/26/19 0337 01/27/19  0340  WBC 11.5* 19.0* 18.3* 12.3*  NEUTROABS  --   --  16.2*  --   HGB 9.9* 9.9* 9.3* 8.6*  HCT 31.9* 31.9* 29.7* 28.1*  MCV 99.1 96.7 97.7 99.3  PLT 578* 529* 576* 514*   Basic Metabolic Panel: Recent Labs  Lab 01/21/19 1324 10-02-19 2255 01/26/19 0852 01/27/19 0340  NA 140 137 135 134*  K 3.7 5.2* 4.7 3.9  CL 97*  96* 94* 90*  CO2 29 27 27 28   GLUCOSE 114* 240* 210* 231*  BUN 21 28* 34* 44*  CREATININE 0.88 1.02* 1.68* 2.73*  CALCIUM 9.5 9.9 9.7 9.1   GFR: Estimated Creatinine Clearance: 18.3 mL/min (A) (by C-G formula based on SCr of 2.73 mg/dL (H)). Liver Function Tests: Recent Labs  Lab 10-02-19 2255  AST 16  ALT 15  ALKPHOS 37*  BILITOT 0.7  PROT 7.0  ALBUMIN 3.1*   No results for input(s): LIPASE, AMYLASE in the last 168 hours. No results for input(s): AMMONIA in the last 168 hours. Coagulation Profile: No results for input(s): INR, PROTIME in the last 168 hours. Cardiac Enzymes: No results for input(s): CKTOTAL, CKMB, CKMBINDEX, TROPONINI in the last 168 hours. BNP (last 3 results) No results for input(s): PROBNP in the last 8760 hours. HbA1C: Recent Labs    10-02-19 2255  HGBA1C 6.4*   CBG: Recent Labs  Lab 01/26/19 2004 01/27/19 0015 01/27/19 0353 01/27/19 0756 01/27/19 1115  GLUCAP 167* 178* 224* 191* 148*   Lipid Profile: No results for input(s): CHOL, HDL, LDLCALC, TRIG, CHOLHDL, LDLDIRECT in the last 72 hours. Thyroid Function Tests: No results for input(s): TSH, T4TOTAL, FREET4, T3FREE, THYROIDAB in the last 72 hours. Anemia Panel: No results for input(s): VITAMINB12, FOLATE, FERRITIN, TIBC, IRON, RETICCTPCT in the last 72 hours. Urine analysis:    Component Value Date/Time   COLORURINE YELLOW 11/28/2011 0013   APPEARANCEUR CLEAR 11/28/2011 0013   LABSPEC 1.010 11/28/2011 0013   PHURINE 6.0 11/28/2011 0013   GLUCOSEU NEGATIVE 11/28/2011 0013   HGBUR TRACE (A) 11/28/2011 0013   BILIRUBINUR NEGATIVE 11/28/2011 0013   KETONESUR NEGATIVE 11/28/2011 0013   PROTEINUR TRACE (A) 11/28/2011 0013   UROBILINOGEN 0.2 11/28/2011 0013   NITRITE NEGATIVE 11/28/2011 0013   LEUKOCYTESUR NEGATIVE 11/28/2011 0013   Sepsis Labs: @LABRCNTIP (procalcitonin:4,lacticidven:4)  )No results found for this or any previous visit (from the past 240 hour(s)).    Radiology  Studies: No results found.   Scheduled Meds: . bisoprolol  5 mg Oral Daily  . cyclobenzaprine  10 mg Oral QHS  . diltiazem  120 mg Oral QHS  . docusate sodium  100 mg Oral BID  . DULoxetine  60 mg Oral Daily  . insulin aspart  0-9 Units Subcutaneous Q4H  . mometasone-formoterol  2 puff Inhalation BID  . omega-3 acid ethyl esters  2 g Oral BID  . pantoprazole  40 mg Oral Daily  . umeclidinium bromide  1 puff Inhalation Daily  . vitamin C  1,000 mg Oral Daily   Continuous Infusions: . sodium chloride 75 mL/hr (01/27/19 1027)  . methocarbamol (ROBAXIN) IV       LOS: 2 days   Time Spent in minutes   30 minutes  Maycie Luera D.O. on 01/27/2019 at 2:04 PM  Between 7am to 7pm - Please see pager noted on [email or web address]  After 7pm go to [email or web address]  And look for the night coverage person covering for me after hours  [facility] Office  [phone]

## 2019-01-27 NOTE — Progress Notes (Signed)
Physical Therapy Treatment Patient Details Name: Angelica [name] MRN: [id number] DOB: 13-Jul-1946 Today's Date: 01/27/2019    History of Present Illness 73 y.o. female admitted on 2019-02-15 for known nonunion supracondylar R humerus fx (fx from a fall earlier fall 2019 -splinted and non surgical management failed) s/p ORIF on 2019-02-15.  PMH significant for HTN, DM, CAD, COPD, asthma, foot surgery, back surgery, and L TKA.    PT Comments    Patient seen for mobility progression. Pt was very drowsy during session and pt's daughter reports that pain medication makes her very drowsy. Pt tolerated short distance gait requiring mod/max A due to increasing bilat LE weakness and required chair push back into room.  Daughter present throughout session. Pt sleeping end of session. Continue to progress as tolerated.    Follow Up Recommendations  Home health PT;Supervision/Assistance - 24 hour     Equipment Recommendations  None recommended by PT    Recommendations for Other Services       Precautions / Restrictions Precautions Precautions: Fall Precaution Comments: h/o falls Required Braces or Orthoses: Sling Restrictions Weight Bearing Restrictions: Yes RUE Weight Bearing: Non weight bearing    Mobility  Bed Mobility Overal bed mobility: Needs Assistance Bed Mobility: Supine to Sit     Supine to sit: Min assist;HOB elevated     General bed mobility comments: assist to elevate trunk into sitting  Transfers Overall transfer level: Needs assistance Equipment used: 1 person hand held assist Transfers: Sit to/from [employer] Sit to Stand: Min assist         General transfer comment: for balance  Ambulation/Gait Ambulation/Gait assistance: Mod assist;Max assist Gait Distance (Feet): 30 Feet Assistive device: 1 person hand held assist;IV Pole Gait Pattern/deviations: Step-through pattern;Decreased stride length;Wide base of support(lateral sway)     General Gait  Details: increasing bilat LE weakness while ambulating and pt ultimately requiring max A for balance and daughter brought chair up behind pt   Stairs             Wheelchair Mobility    Modified Rankin (Stroke Patients Only)       Balance Overall balance assessment: Needs assistance Sitting-balance support: Feet supported;No upper extremity supported Sitting balance-Leahy Scale: Good     Standing balance support: Single extremity supported Standing balance-Leahy Scale: Poor Standing balance comment: requires UE support                             Cognition Arousal/Alertness: Awake/alert Behavior During Therapy: WFL for tasks assessed/performed Overall Cognitive Status: Impaired/Different from baseline Area of Impairment: Attention;Safety/judgement;Problem solving                   Current Attention Level: Sustained     Safety/Judgement: Decreased awareness of safety   Problem Solving: Slow processing;Difficulty sequencing;Requires verbal cues;Requires tactile cues General Comments: pt was pretty drowsy throughout session and slow to respond; daughter present and reports that pt "can't handle pain medication well"      Exercises      General Comments        Pertinent Vitals/Pain Pain Assessment: Faces Faces Pain Scale: Hurts a little bit Pain Location: Rt UE with movement Pain Descriptors / Indicators: Guarding Pain Intervention(s): Monitored during session;Repositioned    Home Living                      Prior Function  PT Goals (current goals can now be found in the care plan section) Progress towards PT goals: Progressing toward goals    Frequency    Min 5X/week      PT Plan Current plan remains appropriate    Co-evaluation              AM-PAC PT "6 Clicks" Mobility   Outcome Measure  Help needed turning from your back to your side while in a flat bed without using bedrails?: A Little Help  needed moving from lying on your back to sitting on the side of a flat bed without using bedrails?: A Little Help needed moving to and from a bed to a chair (including a wheelchair)?: A Little Help needed standing up from a chair using your arms (e.g., wheelchair or bedside chair)?: A Little Help needed to walk in hospital room?: A Lot Help needed climbing 3-5 steps with a railing? : Total 6 Click Score: 15    End of Session Equipment Utilized During Treatment: Gait belt Activity Tolerance: Patient tolerated treatment well Patient left: in chair;with call bell/phone within reach;with family/visitor present Nurse Communication: Mobility status PT Visit Diagnosis: Muscle weakness (generalized) (M62.81);Difficulty in walking, not elsewhere classified (R26.2);Pain;History of falling (Z91.81) Pain - Right/Left: Right Pain - part of body: Arm     Time: 4098-1191 PT Time Calculation (min) (ACUTE ONLY): 31 min  Charges:  $Gait Training: 23-37 mins                     Erline Levine, PTA Acute Rehabilitation Services Pager: [phone] Office: [phone]     Carolynne Edouard 01/27/2019, 5:22 PM

## 2019-01-27 NOTE — Evaluation (Signed)
Occupational Therapy Evaluation Patient Details Name: Angelica [name] MRN: [id number] DOB: [birth year] Today's Date: 01/27/2019    History of Present Illness 73 y.o. female admitted on 02/02/2019 for known nonunion supracondylar R humerus fx (fx from a fall earlier fall 2019 -splinted and non surgical management failed) s/p ORIF on 01/24/2019.  PMH significant for HTN, DM, CAD, COPD, asthma, foot surgery, back surgery, and L TKA.   Clinical Impression   Pt requires min A for functional mobility, and mod - max A for ADLs.  Daughter very supportive and reports pt will have assist the majority of time at discharge.  Reviewed safe techniques for bathing and dressing, use of DME, as well as shoulder and hand exercises.  Will continue to follow.  Recommend HHOT.     Follow Up Recommendations  Home health OT;Supervision/Assistance - 24 hour    Equipment Recommendations  None recommended by OT    Recommendations for Other Services       Precautions / Restrictions Precautions Precautions: Fall Precaution Comments: h/o falls Required Braces or Orthoses: Sling Restrictions Weight Bearing Restrictions: Yes RUE Weight Bearing: Non weight bearing      Mobility Bed Mobility Overal bed mobility: Needs Assistance Bed Mobility: Sit to Supine       Sit to supine: Mod assist   General bed mobility comments: assist to lower head and assist to lift LEs onto bed   Transfers Overall transfer level: Needs assistance Equipment used: 1 person hand held assist Transfers: Sit to/from Stand;Stand Pivot Transfers Sit to Stand: Min assist Stand pivot transfers: Min assist       General transfer comment: assist for balance     Balance Overall balance assessment: Needs assistance Sitting-balance support: Feet supported;No upper extremity supported Sitting balance-Leahy Scale: Good     Standing balance support: Single extremity supported Standing balance-Leahy Scale: Poor Standing balance comment:  requires UE support                            ADL either performed or assessed with clinical judgement   ADL Overall ADL's : Needs assistance/impaired                         Toilet Transfer: Minimal assistance;Ambulation;Comfort height toilet Toilet Transfer Details (indicate cue type and reason): min A pushing IV pole  Toileting- Clothing Manipulation and Hygiene: Maximal assistance;Sit to/from stand Toileting - Clothing Manipulation Details (indicate cue type and reason): difficult for pt to attempt peri care due to IV in dorsum of Lt hand      Functional mobility during ADLs: Minimal assistance General ADL Comments: Pt fatigued this date.  Reviewed UB dressing techniques.   Also discussed how to safely shower - will use 3in1 as shower seat and instructed daughter to bag Rt UE.       Vision         Perception     Praxis      Pertinent Vitals/Pain Pain Assessment: Faces Faces Pain Scale: Hurts even more Pain Location: Rt UE and back  Pain Descriptors / Indicators: Restless;Grimacing Pain Intervention(s): Monitored during session;Limited activity within patient's tolerance;Repositioned;Patient requesting pain meds-RN notified     Hand Dominance     Extremity/Trunk Assessment Upper Extremity Assessment Upper Extremity Assessment: RUE deficits/detail RUE Deficits / Details: Long arm splint.  AAROM to Rt shoulder to ~85* shoulder scaption.  Fingers with arthritiic deformity.   RUE: Unable to fully  assess due to immobilization RUE Coordination: decreased fine motor   Lower Extremity Assessment Lower Extremity Assessment: Defer to PT evaluation       Communication     Cognition Arousal/Alertness: Awake/alert Behavior During Therapy: WFL for tasks assessed/performed Overall Cognitive Status: Impaired/Different from baseline Area of Impairment: Attention;Safety/judgement;Problem solving                   Current Attention Level:  Sustained     Safety/Judgement: Decreased awareness of safety   Problem Solving: Slow processing;Difficulty sequencing;Requires verbal cues;Requires tactile cues General Comments: Pt slow to respond.  she is requires min cues/assist for safety.  Min cues for problem solving and sequencing    General Comments  discussed need for sturdy whoes when pt up walking.   Daughter reports pt will have caregiver assist most of the time at discharge     Exercises Other Exercises Other Exercises: Performed AAROM Lt shoulder scaption x 5, and finger flex/ext x 5 - encouraged pt and daughter to perform 5 reps 3-4 x/day    Shoulder Instructions      Home Living                                          Prior Functioning/Environment                   OT Problem List:        OT Treatment/Interventions:      OT Goals(Current goals can be found in the care plan section)    OT Frequency:     Barriers to D/C:            Co-evaluation              AM-PAC OT "6 Clicks" Daily Activity     Outcome Measure Help from another person eating meals?: A Little Help from another person taking care of personal grooming?: A Little Help from another person toileting, which includes using toliet, bedpan, or urinal?: A Lot Help from another person bathing (including washing, rinsing, drying)?: A Lot Help from another person to put on and taking off regular upper body clothing?: A Lot Help from another person to put on and taking off regular lower body clothing?: A Lot 6 Click Score: 14   End of Session Equipment Utilized During Treatment: Other (comment)(Rt long arm splint ) Nurse Communication: Patient requests pain meds  Activity Tolerance: Patient limited by fatigue Patient left: in bed;with call bell/phone within reach;with family/visitor present  OT Visit Diagnosis: History of falling (Z91.81)                Time: 8366-2947 OT Time Calculation (min): 34  min Charges:  OT General Charges $OT Visit: 1 Visit OT Treatments $Self Care/Home Management : 23-37 mins  Jeani Hawking, OTR/L Acute Rehabilitation Services Pager [phone] Office [phone]   Jeani Hawking M 01/27/2019, 11:50 AM

## 2019-01-28 ENCOUNTER — Encounter (HOSPITAL_COMMUNITY): Payer: Self-pay | Admitting: Orthopedic Surgery

## 2019-01-28 ENCOUNTER — Inpatient Hospital Stay (HOSPITAL_COMMUNITY): Payer: Medicare Other

## 2019-01-28 DIAGNOSIS — D72829 Elevated white blood cell count, unspecified: Secondary | ICD-10-CM

## 2019-01-28 DIAGNOSIS — N179 Acute kidney failure, unspecified: Secondary | ICD-10-CM

## 2019-01-28 LAB — BASIC METABOLIC PANEL
ANION GAP: 15 (ref 5–15)
BUN: 57 mg/dL — ABNORMAL HIGH (ref 8–23)
CO2: 20 mmol/L — ABNORMAL LOW (ref 22–32)
Calcium: 8.4 mg/dL — ABNORMAL LOW (ref 8.9–10.3)
Chloride: 97 mmol/L — ABNORMAL LOW (ref 98–111)
Creatinine, Ser: 2.84 mg/dL — ABNORMAL HIGH (ref 0.44–1.00)
GFR calc Af Amer: 18 mL/min — ABNORMAL LOW (ref >60)
GFR calc non Af Amer: 16 mL/min — ABNORMAL LOW (ref >60)
Glucose, Bld: 147 mg/dL — ABNORMAL HIGH (ref 70–99)
Potassium: 4.8 mmol/L (ref 3.5–5.1)
Sodium: 132 mmol/L — ABNORMAL LOW (ref 135–145)

## 2019-01-28 LAB — CBC
HCT: 25.7 % — ABNORMAL LOW (ref 36.0–46.0)
Hemoglobin: 7.9 g/dL — ABNORMAL LOW (ref 12.0–15.0)
MCH: 29.8 pg (ref 26.0–34.0)
MCHC: 30.7 g/dL (ref 30.0–36.0)
MCV: 97 fL (ref 80.0–100.0)
NRBC: 0 % (ref 0.0–0.2)
Platelets: 439 10*3/uL — ABNORMAL HIGH (ref 150–400)
RBC: 2.65 MIL/uL — ABNORMAL LOW (ref 3.87–5.11)
RDW: 14.3 % (ref 11.5–15.5)
WBC: 16.5 10*3/uL — AB (ref 4.0–10.5)

## 2019-01-28 LAB — SODIUM, URINE, RANDOM: Sodium, Ur: <10 mmol/L

## 2019-01-28 LAB — GLUCOSE, CAPILLARY
GLUCOSE-CAPILLARY: 144 mg/dL — AB (ref 70–99)
GLUCOSE-CAPILLARY: 139 mg/dL — AB (ref 70–99)
GLUCOSE-CAPILLARY: 213 mg/dL — AB (ref 70–99)
Glucose-Capillary: 144 mg/dL — ABNORMAL HIGH (ref 70–99)
Glucose-Capillary: 141 mg/dL — ABNORMAL HIGH (ref 70–99)
Glucose-Capillary: 142 mg/dL — ABNORMAL HIGH (ref 70–99)

## 2019-01-28 LAB — URINALYSIS, ROUTINE W REFLEX MICROSCOPIC
Bilirubin Urine: NEGATIVE
Glucose, UA: NEGATIVE mg/dL
Hgb urine dipstick: NEGATIVE
Ketones, ur: NEGATIVE mg/dL
Leukocytes, UA: NEGATIVE
Nitrite: NEGATIVE
Protein, ur: NEGATIVE mg/dL
Specific Gravity, Urine: 1.014 (ref 1.005–1.030)
pH: 5 (ref 5.0–8.0)

## 2019-01-28 LAB — OSMOLALITY, URINE: Osmolality, Ur: 378 mOsm/kg (ref 300–900)

## 2019-01-28 LAB — CREATININE, URINE, RANDOM: Creatinine, Urine: 131.56 mg/dL

## 2019-01-28 MED ORDER — ACETAMINOPHEN 325 MG PO TABS: 650.0000 mg | ORAL_TABLET | Freq: Four times a day (QID) | ORAL | Status: DC | PRN

## 2019-01-28 MED ORDER — SODIUM CHLORIDE 0.9 % IV SOLN
INTRAVENOUS | Status: AC
  Administered 2019-01-28: 11:00:00 via INTRAVENOUS

## 2019-01-28 NOTE — Progress Notes (Signed)
OT Cancellation Note  Patient Details Name: KIMBERLYNN BLANDON MRN: [id number] DOB: October 19, 1946   Cancelled Treatment:    Reason Eval/Treat Not Completed: Patient at procedure or test/ unavailable(x rays)  Marquette Old, MSOT, OTR/L  Supplemental Rehabilitation Services  [phone] Zigmund Daniel 01/28/2019, 2:27 PM

## 2019-01-28 NOTE — Plan of Care (Signed)
  Problem: Health Behavior/Discharge Planning: Goal: Ability to manage health-related needs will improve Outcome: Progressing   Problem: Activity: Goal: Risk for activity intolerance will decrease Outcome: Progressing   Problem: Pain Managment: Goal: General experience of comfort will improve Outcome: Progressing   Problem: Safety: Goal: Ability to remain free from injury will improve Outcome: Progressing   

## 2019-01-28 NOTE — Progress Notes (Signed)
Patient has urinated 150 cc via purecih. Bladder scan yielded 317cc, Patient has been confused since dayshift yesterday. Daughter was asking if we could test her urine for UTI. Paged [facility] NP to notify and afterwards noted U/A is pending. Will collect the next time  patient urinates. No acute distress noted.

## 2019-01-28 NOTE — Progress Notes (Signed)
Physical Therapy Treatment Note  Clinical Impression: Continuing work on functional mobility and activity tolerance;  [name] was more confused today, and much less steady with ambulation; Multiple losses of balance requiring heavy mod assist to prevent falls in a short distance (in-room amb);   Noting Ortho and Hospitalist intervention, and I am hopeful that this confusion (and not-great labwork) is transient, and as she improves medically, she will improve functionally and be able to dc back to her home; Still if she is slow to progress, we may need to consider SNF for post-acute rehab. Discussed this with her daughter.     01/28/19 1300  PT Visit Information  Last PT Received On 01/28/19  Assistance Needed +2 (second person helpful for lines and chair push)  History of Present Illness 73 y.o. female admitted on 01/31/2019 for known nonunion supracondylar R humerus fx (fx from a fall earlier fall 2019 -splinted and non surgical management failed) s/p ORIF on 02/01/2019.  PMH significant for HTN, DM, CAD, COPD, asthma, foot surgery, back surgery, and L TKA.  Subjective Data  Patient Stated Goal to get stronger and more balanced on her feet, decrease pain, to get back to living at her own home by [name]  Precautions  Precautions Fall  Precaution Comments h/o falls  Required Braces or Orthoses Sling  Restrictions  RUE Weight Bearing NWB  Pain Assessment  Pain Assessment Faces  Faces Pain Scale 4  Pain Location Rt UE with movement  Pain Descriptors / Indicators Guarding  Pain Intervention(s) Monitored during session  Cognition  Arousal/Alertness Awake/alert  Behavior During Therapy Restless  Overall Cognitive Status Impaired/Different from baseline  Area of Impairment Attention;Safety/judgement;Problem solving  Current Attention Level Sustained  Safety/Judgement Decreased awareness of safety  Problem Solving Slow processing;Difficulty sequencing;Requires verbal cues;Requires tactile cues   Bed Mobility  Overal bed mobility Needs Assistance  Bed Mobility Supine to Sit  Supine to sit Mod assist  General bed mobility comments Mod assist to help LEs off of the bed getting up on L side, mod assist also to elevate trunk to sit and square off hips at EOB  Transfers  Overall transfer level Needs assistance  Equipment used 1 person hand held assist  Transfers Sit to/from Stand  Sit to Stand Min assist  General transfer comment for balance  Ambulation/Gait  Ambulation/Gait assistance Mod assist;Max assist  Gait Distance (Feet) 8 Feet  Assistive device 1 person hand held assist (second person for chair push/lines)  Gait Pattern/deviations Step-through pattern;Decreased stride length;Wide base of support (lateral sway)  General Gait Details Very unsteady gait with multiple losses of balance in such a short distance  Balance  Sitting balance-Leahy Scale Fair  Sitting balance - Comments supervision EOB  Standing balance support Single extremity supported  Standing balance-Leahy Scale Poor  Standing balance comment requires UE support   PT - End of Session  Equipment Utilized During Treatment Gait belt  Activity Tolerance Patient tolerated treatment well  Patient left in chair;with call bell/phone within reach;with family/visitor present  Nurse Communication Mobility status   PT - Assessment/Plan  PT Plan Other (comment);Discharge plan needs to be updated (Must consider SNF at her current functional level)  PT Visit Diagnosis Muscle weakness (generalized) (M62.81);Difficulty in walking, not elsewhere classified (R26.2);Pain;History of falling (Z91.81)  Pain - Right/Left Right  Pain - part of body Arm  PT Frequency (ACUTE ONLY) Min 5X/week  Follow Up Recommendations Home health PT;SNF;Other (comment) (If slow progress, must consider SNF)  PT equipment None  recommended by PT  AM-PAC PT "6 Clicks" Mobility Outcome Measure (Version 2)  Help needed turning from your back to your  side while in a flat bed without using bedrails? 3  Help needed moving from lying on your back to sitting on the side of a flat bed without using bedrails? 3  Help needed moving to and from a bed to a chair (including a wheelchair)? 2  Help needed standing up from a chair using your arms (e.g., wheelchair or bedside chair)? 2  Help needed to walk in hospital room? 2  Help needed climbing 3-5 steps with a railing?  1  6 Click Score 13  Consider Recommendation of Discharge To: CIR/SNF/LTACH  PT Goal Progression  Progress towards PT goals Not progressing toward goals - comment (more confused, worse balance today)  Acute Rehab PT Goals  PT Goal Formulation With patient/family  Time For Goal Achievement 02/09/19  Potential to Achieve Goals Good  PT Time Calculation  PT Start Time (ACUTE ONLY) 1058  PT Stop Time (ACUTE ONLY) 1129  PT Time Calculation (min) (ACUTE ONLY) 31 min  PT General Charges  $$ ACUTE PT VISIT 1 Visit  PT Treatments  $Gait Training 8-22 mins  $Therapeutic Activity 8-22 mins    Van ClinesHolly Anav Lammert, PT  Acute Rehabilitation Services Pager [phone] Office [phone]

## 2019-01-28 NOTE — Progress Notes (Signed)
PROGRESS NOTE    Angelica [name]  YNW:[id number] DOB: 01/13/1946 DOA: 01/19/2019 PCP: Joette CatchingNyland, Leonard, MD   Brief Narrative: Consulted for hyperkalemia. Patient with a history of diabetes, coronary artery disease, COPD, asthma, hypertension, iron deficiency anemia, presented for right elbow repair.  Recently undergone construction by Dr. 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 Amanda PeaGramig about blood transfusion  Essential hypertension -Blood pressure stable, continue to monitor -Continue bisoprolol -Losartan held  Nonunion supracondylar humerus fracture, right upper extremity -Status post surgery, per primary, Dr. Amanda PeaGramig -Hold narcotic  Diabetes mellitus, type II -Home medications held continue insulin sliding scale with CBG monitoring  Leukocytosis worsening she appears tachypneic though daughter says that she is abdominal breather will obtain chest x-ray.  UA done today looks unremarkable.  Encouraged patient's daughter to help her use incentive spirometry.  -  Estimated body mass index is 35.82 kg/m as calculated from the following:   Height as of this encounter: 5\' 1"  (1.549 m).   Weight as of this encounter: 86 kg.  DVT prophylaxis SCD Code Status: Full code Family  Communication discussed with daughter at bedside Disposition Plan:  Patient not ready for discharge today because of increasing confusion increasing WBCs  Consultants:  TRH Procedures: Right supracondylar humerus fracture nonunion Antimicrobials:  None Subjective: None resting in bed was able to wake her up and have a conversation daughter reports she is better still slow and confused  Objective: Vitals:   01/27/19 1906 01/27/19 2145 01/28/19 0244 01/28/19 0854  BP:  (!) 127/50 121/66   Pulse:  79 74   Resp:      Temp:  97.7 F (36.5 C) 98.7 F (37.1 C)   TempSrc:  Oral Oral   SpO2: 94% 92% 96% 95%  Weight:      Height:        Intake/Output Summary (Last 24 hours) at 01/28/2019 1151 Last data filed at 01/28/2019 0655 Gross per 24 hour  Intake 630 ml  Output 500 ml  Net 130 ml   Filed Weights   01/27/2019 1139  Weight: 86 kg    Examination: Urine appears concentrated General exam: Appears calm and comfortable oral mucosa dry Respiratory system: Clear to auscultation. Respiratory effort normal. Cardiovascular system: S1 & S2 heard, RRR. No JVD, murmurs, rubs, gallops or clicks. No pedal edema. Gastrointestinal system: Abdomen is nondistended, soft and nontender. No organomegaly or masses felt. Normal bowel sounds heard. Central nervous system: Awake oriented to the hospital moves all extremities Extremities: No edema Skin: No rashes, lesions or ulcers    Data Reviewed: I have personally reviewed following labs and imaging studies  CBC: Recent Labs  Lab 01/21/19 1324 02/13/2019 2255 01/26/19 0337 01/27/19 0340 01/28/19 0352  WBC 11.5* 19.0* 18.3* 12.3* 16.5*  NEUTROABS  --   --  16.2*  --   --  HGB 9.9* 9.9* 9.3* 8.6* 7.9*  HCT 31.9* 31.9* 29.7* 28.1* 25.7*  MCV 99.1 96.7 97.7 99.3 97.0  PLT 578* 529* 576* 514* 439*   Basic Metabolic Panel: Recent Labs  Lab 01/21/19 1324 02/14/2019 2255 01/26/19 0852 01/27/19 0340 01/28/19 0352  NA 140 137 135 134*  132*  K 3.7 5.2* 4.7 3.9 4.8  CL 97* 96* 94* 90* 97*  CO2 29 27 27 28  20*  GLUCOSE 114* 240* 210* 231* 147*  BUN 21 28* 34* 44* 57*  CREATININE 0.88 1.02* 1.68* 2.73* 2.84*  CALCIUM 9.5 9.9 9.7 9.1 8.4*   GFR: Estimated Creatinine Clearance: 17.6 mL/min (A) (by C-G formula based on SCr of 2.84 mg/dL (H)). Liver Function Tests: Recent Labs  Lab 01/20/2019 2255  AST 16  ALT 15  ALKPHOS 37*  BILITOT 0.7  PROT 7.0  ALBUMIN 3.1*   No results for input(s): LIPASE, AMYLASE in the last 168 hours. No results for input(s): AMMONIA in the last 168 hours. Coagulation Profile: No results for input(s): INR, PROTIME in the last 168 hours. Cardiac Enzymes: No results for input(s): CKTOTAL, CKMB, CKMBINDEX, TROPONINI in the last 168 hours. BNP (last 3 results) No results for input(s): PROBNP in the last 8760 hours. HbA1C: Recent Labs    01/23/2019 2255  HGBA1C 6.4*   CBG: Recent Labs  Lab 01/27/19 1649 01/27/19 1922 01/28/19 0101 01/28/19 0559 01/28/19 0801  GLUCAP 159* 164* 144* 144* 139*   Lipid Profile: No results for input(s): CHOL, HDL, LDLCALC, TRIG, CHOLHDL, LDLDIRECT in the last 72 hours. Thyroid Function Tests: No results for input(s): TSH, T4TOTAL, FREET4, T3FREE, THYROIDAB in the last 72 hours. Anemia Panel: No results for input(s): VITAMINB12, FOLATE, FERRITIN, TIBC, IRON, RETICCTPCT in the last 72 hours. Sepsis Labs: No results for input(s): PROCALCITON, LATICACIDVEN in the last 168 hours.  No results found for this or any previous visit (from the past 240 hour(s)).       Radiology Studies: [place] Renal  Result Date: 01/27/2019 CLINICAL DATA:  Acute renal failure EXAM: RENAL / URINARY TRACT ULTRASOUND COMPLETE COMPARISON:  None. FINDINGS: Right Kidney: Renal measurements: 11.8 x 5.5 x 6.4 cm = volume: 218.5 mL . Echogenicity within normal limits. No mass or hydronephrosis visualized. Left Kidney: Renal measurements: 12.5 x 7.3 x 6.1 cm = volume: 290.4 mL.  Echogenicity within normal limits. No mass or hydronephrosis visualized. Bladder: Appears normal for degree of bladder distention. IMPRESSION: No hydronephrosis. Electronically Signed   By: Annia Beltrew  Davis M.D.   On: 01/27/2019 16:44        Scheduled Meds: . bisoprolol  5 mg Oral Daily  . cyclobenzaprine  10 mg Oral QHS  . diltiazem  120 mg Oral QHS  . docusate sodium  100 mg Oral BID  . DULoxetine  60 mg Oral Daily  . insulin aspart  0-9 Units Subcutaneous Q4H  . mometasone-formoterol  2 puff Inhalation BID  . omega-3 acid ethyl esters  2 g Oral BID  . pantoprazole  40 mg Oral Daily  . umeclidinium bromide  1 puff Inhalation Daily  . vitamin C  1,000 mg Oral Daily   Continuous Infusions: . sodium chloride 125 mL/hr at 01/28/19 1035     LOS: 3 days     Alwyn RenElizabeth G Mathews, MD [facility]  If 7PM-7AM, please contact night-coverage [email or web address] Password [email or web address] 01/28/2019, 11:51 AM

## 2019-01-28 NOTE — Progress Notes (Signed)
Occupational Therapy Treatment Patient Details Name: Angelica [name] MRN: [id number] DOB: 1946-04-22 Today's Date: 01/28/2019    History of present illness 73 y.o. female admitted on 02/14/2019 for known nonunion supracondylar R humerus fx (fx from a fall earlier fall 2019 -splinted and non surgical management failed) s/p ORIF on 01/30/2019.  PMH significant for HTN, DM, CAD, COPD, asthma, foot surgery, back surgery, and L TKA.   OT comments  Pt is not progressing toward goals. Today' session pt observed to functionally decline with a decrease in SpO2. Pt is highly confused and demonstrates decreased safety awareness with impulsivity when completing functional transfers, by initiating ambulating with no assistance near. Pt is currently Mod A +2 in functional transfers due to unsteady gate and SOB. Due to unsafe conditions and families inability to assist at her condition, DC recommendation to SNF due to medical concerns and decreased function. Pt will benefit from skilled OT at rehab to regain the function needed to return to home environment safely to improve independence for ADL tasks. Please consider this revision, spoken with daughter and she agrees that SNF would be a better option and safer. OT will continue to follow acutely.   Follow Up Recommendations  Home health OT;Supervision/Assistance - 24 hour;SNF    Equipment Recommendations  None recommended by OT    Recommendations for Other Services      Precautions / Restrictions Precautions Precautions: Fall Precaution Comments: h/o falls Required Braces or Orthoses: Sling Restrictions Weight Bearing Restrictions: Yes RUE Weight Bearing: Non weight bearing       Mobility Bed Mobility Overal bed mobility: Needs Assistance Bed Mobility: Supine to Sit     Supine to sit: Mod assist     General bed mobility comments: Pt received transferin to [facility] with family. Therapist assisted for safety.   Transfers Overall transfer level: Needs  assistance Equipment used: 1 person hand held assist Transfers: Sit to/from Stand Sit to Stand: Mod assist;+2 physical assistance;+2 safety/equipment         General transfer comment: toilet transfer    Balance Overall balance assessment: Needs assistance Sitting-balance support: Feet supported;No upper extremity supported;Single extremity supported Sitting balance-Leahy Scale: Fair Sitting balance - Comments: supervision EOB   Standing balance support: Single extremity supported Standing balance-Leahy Scale: Poor Standing balance comment: requires UE support                            ADL either performed or assessed with clinical judgement   ADL Overall ADL's : Needs assistance/impaired Eating/Feeding: Sitting;Set up   Grooming: Minimal assistance   Upper Body Bathing: Maximal assistance       Upper Body Dressing : Maximal assistance       Toilet Transfer: Ambulation;Comfort height toilet;Moderate assistance;+2 for safety/equipment;+2 for physical assistance;Cueing for safety;Cueing for sequencing Toilet Transfer Details (indicate cue type and reason): Pt observed to have shuffled gait requiring more support from therapist.  Toileting- Clothing Manipulation and Hygiene: Maximal assistance;Sit to/from stand Toileting - Clothing Manipulation Details (indicate cue type and reason): While seated on bsc PT stood with 2 hand held support required Max A hygiene     Functional mobility during ADLs: Moderate assistance;+2 for physical assistance;+2 for safety/equipment General ADL Comments: pt fatigued and confused. Required Max VCs fir sequencing and safety for sit to stand transfers due to decreased safety aware. SpO2 levels checked. in sitting 92 then dropped to 90 during ambulation. Pt left seated in chair 2L o2 with  SpO2 of 97.      Vision       Perception     Praxis      Cognition Arousal/Alertness: Awake/alert Behavior During Therapy:  Restless Overall Cognitive Status: Impaired/Different from baseline Area of Impairment: Attention;Safety/judgement;Problem solving                   Current Attention Level: Sustained     Safety/Judgement: Decreased awareness of safety   Problem Solving: Slow processing;Difficulty sequencing;Requires verbal cues;Requires tactile cues General Comments: pt was pretty drowsy throughout session and slow to respond; daughter present and reports that pt "can't handle pain medication well".         Exercises Exercises: Other exercises   Shoulder Instructions       General Comments daughter present session. educated of exercises, edema control, and possible discharge change due to functional decline.     Pertinent Vitals/ Pain       Pain Assessment: Faces Faces Pain Scale: No hurt Pain Location: Rt UE with movement Pain Descriptors / Indicators: Guarding Pain Intervention(s): Monitored during session;Repositioned  Home Living                                          Prior Functioning/Environment              Frequency  Min 2X/week        Progress Toward Goals  OT Goals(current goals can now be found in the care plan section)  Progress towards OT goals: Not progressing toward goals - comment  Acute Rehab OT Goals Patient Stated Goal: to get stronger and more balanced on her feet, decrease pain, to get back to living at her own home by [place] OT Goal Formulation: With patient/family Time For Goal Achievement: 02/09/19 Potential to Achieve Goals: Good ADL Goals Pt Will Perform Upper Body Bathing: with min assist;sitting Pt Will Perform Upper Body Dressing: sitting;with min assist Pt/caregiver will Perform Home Exercise Program: Increased ROM;Right Upper extremity;With Supervision  Plan Discharge plan needs to be updated    Co-evaluation                 AM-PAC OT "6 Clicks" Daily Activity     Outcome Measure   Help from another  person eating meals?: A Little Help from another person taking care of personal grooming?: A Little Help from another person toileting, which includes using toliet, bedpan, or urinal?: A Lot Help from another person bathing (including washing, rinsing, drying)?: A Lot Help from another person to put on and taking off regular upper body clothing?: A Lot Help from another person to put on and taking off regular lower body clothing?: A Lot 6 Click Score: 14    End of Session Equipment Utilized During Treatment: Other (comment)(Rt long arm splint )  OT Visit Diagnosis: History of falling (Z91.81)   Activity Tolerance Patient limited by fatigue;Patient limited by lethargy   Patient Left with call bell/phone within reach;with family/visitor present;in chair   Nurse Communication Mobility status        Time: [id number] OT Time Calculation (min): 61 min  Charges: OT General Charges $OT Visit: 1 Visit OT Treatments $Self Care/Home Management : 53-67 mins  Marquette OldEvan Jodell Weitman, MSOT, OTR/L  Supplemental Rehabilitation Services  [phone]   Zigmund DanielEvan M Yanelle Sousa 01/28/2019, 4:05 PM

## 2019-01-28 NOTE — Progress Notes (Signed)
Patient alert and verbal. Able to follow commands. Improvement in mental status from this morning. Assisted to bedside commode and able to void. Family at bed side and also verbalize improvement in mental status. Denies pain. Will cont to monitor.

## 2019-01-28 NOTE — Progress Notes (Signed)
Patient ID: JENNIGER LIGGON, female   DOB: [birth year], 73 y.o.   MRN: [id number] Patient is stable today. She had a difficult night with some confusion. Right upper extremity has no change.  She is tolerating her diet and voiding.  Urinalysis and urine culture have not been performed yet.  I discussed this with nursing staff. Her renal ultrasound was within normal limits.  Unfortunately her labs are going in the wrong direction.  She is off her antibiotics and still maintains a significant increase in her BUN and creatinine.  At present time we will plan for discharge once medicine is comfortable with this.  Her white count is risen to 16,000.  I do not suspect any infection from her surgical intervention.  We discussed this.  We will see how she does through the morning and get the thoughts of our medicine service in regards to next steps. I am going to discontinue her pain medicine in hopes that this is part of the confusion issue as well. From my standpoint I do feel comfortable with transitioning to home when she is medically stable.  I would recommend she have appropriate home therapy.  Jaz Mallick MD cell phone [phone]

## 2019-01-29 ENCOUNTER — Inpatient Hospital Stay (HOSPITAL_COMMUNITY): Payer: Medicare Other

## 2019-01-29 ENCOUNTER — Inpatient Hospital Stay (HOSPITAL_COMMUNITY): Payer: Medicare Other | Admitting: Anesthesiology

## 2019-01-29 ENCOUNTER — Encounter (HOSPITAL_COMMUNITY): Payer: Self-pay | Admitting: Orthopedic Surgery

## 2019-01-29 ENCOUNTER — Encounter (HOSPITAL_COMMUNITY): Admission: RE | Disposition: E | Payer: Self-pay | Source: Home / Self Care | Attending: Orthopedic Surgery

## 2019-01-29 DIAGNOSIS — N179 Acute kidney failure, unspecified: Secondary | ICD-10-CM

## 2019-01-29 DIAGNOSIS — E119 Type 2 diabetes mellitus without complications: Secondary | ICD-10-CM

## 2019-01-29 DIAGNOSIS — J9601 Acute respiratory failure with hypoxia: Secondary | ICD-10-CM

## 2019-01-29 DIAGNOSIS — I6601 Occlusion and stenosis of right middle cerebral artery: Secondary | ICD-10-CM | POA: Diagnosis present

## 2019-01-29 DIAGNOSIS — J449 Chronic obstructive pulmonary disease, unspecified: Secondary | ICD-10-CM

## 2019-01-29 HISTORY — PX: IR CT HEAD LTD: IMG2386

## 2019-01-29 HISTORY — PX: IR PERCUTANEOUS ART THROMBECTOMY/INFUSION INTRACRANIAL INC DIAG ANGIO: IMG6087

## 2019-01-29 HISTORY — PX: RADIOLOGY WITH ANESTHESIA: SHX6223

## 2019-01-29 HISTORY — PX: IR ANGIO VERTEBRAL SEL SUBCLAVIAN INNOMINATE UNI R MOD SED: IMG5365

## 2019-01-29 LAB — CBC
HCT: 24.5 % — ABNORMAL LOW (ref 36.0–46.0)
HCT: 25.4 % — ABNORMAL LOW (ref 36.0–46.0)
Hemoglobin: 7.7 g/dL — ABNORMAL LOW (ref 12.0–15.0)
Hemoglobin: 7.9 g/dL — ABNORMAL LOW (ref 12.0–15.0)
MCH: 30.1 pg (ref 26.0–34.0)
MCH: 29.9 pg (ref 26.0–34.0)
MCHC: 31.4 g/dL (ref 30.0–36.0)
MCHC: 31.1 g/dL (ref 30.0–36.0)
MCV: 95.7 fL (ref 80.0–100.0)
MCV: 96.2 fL (ref 80.0–100.0)
Platelets: 410 10*3/uL — ABNORMAL HIGH (ref 150–400)
Platelets: 405 10*3/uL — ABNORMAL HIGH (ref 150–400)
RBC: 2.56 MIL/uL — ABNORMAL LOW (ref 3.87–5.11)
RBC: 2.64 MIL/uL — AB (ref 3.87–5.11)
RDW: 15.1 % (ref 11.5–15.5)
RDW: 15.4 % (ref 11.5–15.5)
WBC: 9.2 10*3/uL (ref 4.0–10.5)
WBC: 10 10*3/uL (ref 4.0–10.5)
nRBC: 0 % (ref 0.0–0.2)
nRBC: 0 % (ref 0.0–0.2)

## 2019-01-29 LAB — POCT I-STAT 7, (LYTES, BLD GAS, ICA,H+H)
Acid-base deficit: 3 mmol/L — ABNORMAL HIGH (ref 0.0–2.0)
Acid-base deficit: 2 mmol/L (ref 0.0–2.0)
Acid-base deficit: 2 mmol/L (ref 0.0–2.0)
Bicarbonate: 23.8 mmol/L (ref 20.0–28.0)
Bicarbonate: 24.5 mmol/L (ref 20.0–28.0)
Bicarbonate: 24.3 mmol/L (ref 20.0–28.0)
Calcium, Ion: 1.13 mmol/L — ABNORMAL LOW (ref 1.15–1.40)
Calcium, Ion: 1.14 mmol/L — ABNORMAL LOW (ref 1.15–1.40)
Calcium, Ion: 1.13 mmol/L — ABNORMAL LOW (ref 1.15–1.40)
HCT: 18 % — ABNORMAL LOW (ref 36.0–46.0)
HCT: 19 % — ABNORMAL LOW (ref 36.0–46.0)
HCT: 22 % — ABNORMAL LOW (ref 36.0–46.0)
HEMOGLOBIN: 7.5 g/dL — AB (ref 12.0–15.0)
Hemoglobin: 6.1 g/dL — CL (ref 12.0–15.0)
Hemoglobin: 6.5 g/dL — CL (ref 12.0–15.0)
O2 SAT: 98 %
O2 Saturation: 99 %
O2 Saturation: 99 %
Patient temperature: 97.5
Potassium: 4.1 mmol/L (ref 3.5–5.1)
Potassium: 4.2 mmol/L (ref 3.5–5.1)
Potassium: 4.2 mmol/L (ref 3.5–5.1)
Sodium: 135 mmol/L (ref 135–145)
Sodium: 135 mmol/L (ref 135–145)
Sodium: 135 mmol/L (ref 135–145)
TCO2: 25 mmol/L (ref 22–32)
TCO2: 26 mmol/L (ref 22–32)
TCO2: 26 mmol/L (ref 22–32)
pCO2 arterial: 50 mmHg — ABNORMAL HIGH (ref 32.0–48.0)
pCO2 arterial: 50.3 mmHg — ABNORMAL HIGH (ref 32.0–48.0)
pCO2 arterial: 48.7 mmHg — ABNORMAL HIGH (ref 32.0–48.0)
pH, Arterial: 7.285 — ABNORMAL LOW (ref 7.350–7.450)
pH, Arterial: 7.296 — ABNORMAL LOW (ref 7.350–7.450)
pH, Arterial: 7.303 — ABNORMAL LOW (ref 7.350–7.450)
pO2, Arterial: 181 mmHg — ABNORMAL HIGH (ref 83.0–108.0)
pO2, Arterial: 121 mmHg — ABNORMAL HIGH (ref 83.0–108.0)
pO2, Arterial: 163 mmHg — ABNORMAL HIGH (ref 83.0–108.0)

## 2019-01-29 LAB — BASIC METABOLIC PANEL
ANION GAP: 11 (ref 5–15)
Anion gap: 15 (ref 5–15)
BUN: 40 mg/dL — ABNORMAL HIGH (ref 8–23)
BUN: 42 mg/dL — ABNORMAL HIGH (ref 8–23)
CHLORIDE: 105 mmol/L (ref 98–111)
CO2: 20 mmol/L — ABNORMAL LOW (ref 22–32)
CO2: 22 mmol/L (ref 22–32)
Calcium: 7.3 mg/dL — ABNORMAL LOW (ref 8.9–10.3)
Calcium: 8.1 mg/dL — ABNORMAL LOW (ref 8.9–10.3)
Chloride: 102 mmol/L (ref 98–111)
Creatinine, Ser: 1.18 mg/dL — ABNORMAL HIGH (ref 0.44–1.00)
Creatinine, Ser: 1.33 mg/dL — ABNORMAL HIGH (ref 0.44–1.00)
GFR calc Af Amer: 53 mL/min — ABNORMAL LOW (ref >60)
GFR calc Af Amer: 46 mL/min — ABNORMAL LOW (ref >60)
GFR calc non Af Amer: 46 mL/min — ABNORMAL LOW (ref >60)
GFR calc non Af Amer: 40 mL/min — ABNORMAL LOW (ref >60)
Glucose, Bld: 134 mg/dL — ABNORMAL HIGH (ref 70–99)
Glucose, Bld: 162 mg/dL — ABNORMAL HIGH (ref 70–99)
Potassium: 4 mmol/L (ref 3.5–5.1)
Potassium: 4.6 mmol/L (ref 3.5–5.1)
SODIUM: 140 mmol/L (ref 135–145)
Sodium: 135 mmol/L (ref 135–145)

## 2019-01-29 LAB — CBC WITH DIFFERENTIAL/PLATELET
Abs Immature Granulocytes: 0.05 10*3/uL (ref 0.00–0.07)
Abs Immature Granulocytes: 0.06 10*3/uL (ref 0.00–0.07)
BASOS ABS: 0 10*3/uL (ref 0.0–0.1)
Basophils Absolute: 0 10*3/uL (ref 0.0–0.1)
Basophils Relative: 0 %
Basophils Relative: 0 %
Eosinophils Absolute: 0.1 10*3/uL (ref 0.0–0.5)
Eosinophils Absolute: 0 10*3/uL (ref 0.0–0.5)
Eosinophils Relative: 1 %
Eosinophils Relative: 0 %
HCT: 20.2 % — ABNORMAL LOW (ref 36.0–46.0)
HCT: 22.9 % — ABNORMAL LOW (ref 36.0–46.0)
Hemoglobin: 6.1 g/dL — CL (ref 12.0–15.0)
Hemoglobin: 7 g/dL — ABNORMAL LOW (ref 12.0–15.0)
IMMATURE GRANULOCYTES: 1 %
Immature Granulocytes: 1 %
LYMPHS PCT: 9 %
Lymphocytes Relative: 10 %
Lymphs Abs: 0.8 10*3/uL (ref 0.7–4.0)
Lymphs Abs: 1.2 10*3/uL (ref 0.7–4.0)
MCH: 30 pg (ref 26.0–34.0)
MCH: 30.3 pg (ref 26.0–34.0)
MCHC: 30.2 g/dL (ref 30.0–36.0)
MCHC: 30.6 g/dL (ref 30.0–36.0)
MCV: 99.5 fL (ref 80.0–100.0)
MCV: 99.1 fL (ref 80.0–100.0)
Monocytes Absolute: 0.8 10*3/uL (ref 0.1–1.0)
Monocytes Absolute: 1 10*3/uL (ref 0.1–1.0)
Monocytes Relative: 8 %
Monocytes Relative: 8 %
NEUTROS PCT: 81 %
NRBC: 0 % (ref 0.0–0.2)
Neutro Abs: 7.6 10*3/uL (ref 1.7–7.7)
Neutro Abs: 9.8 10*3/uL — ABNORMAL HIGH (ref 1.7–7.7)
Neutrophils Relative %: 81 %
Platelets: 372 10*3/uL (ref 150–400)
Platelets: 469 10*3/uL — ABNORMAL HIGH (ref 150–400)
RBC: 2.03 MIL/uL — ABNORMAL LOW (ref 3.87–5.11)
RBC: 2.31 MIL/uL — ABNORMAL LOW (ref 3.87–5.11)
RDW: 14.4 % (ref 11.5–15.5)
RDW: 14.6 % (ref 11.5–15.5)
WBC: 9.3 10*3/uL (ref 4.0–10.5)
WBC: 12.1 10*3/uL — AB (ref 4.0–10.5)
nRBC: 0 % (ref 0.0–0.2)

## 2019-01-29 LAB — PROTIME-INR
INR: 1.24
INR: 1.18
PROTHROMBIN TIME: 14.9 s (ref 11.4–15.2)
Prothrombin Time: 15.4 seconds — ABNORMAL HIGH (ref 11.4–15.2)

## 2019-01-29 LAB — GLUCOSE, CAPILLARY
Glucose-Capillary: 137 mg/dL — ABNORMAL HIGH (ref 70–99)
Glucose-Capillary: 129 mg/dL — ABNORMAL HIGH (ref 70–99)
Glucose-Capillary: 164 mg/dL — ABNORMAL HIGH (ref 70–99)
Glucose-Capillary: 144 mg/dL — ABNORMAL HIGH (ref 70–99)
Glucose-Capillary: 86 mg/dL (ref 70–99)

## 2019-01-29 LAB — URINE CULTURE: CULTURE: NO GROWTH

## 2019-01-29 LAB — MRSA PCR SCREENING: MRSA by PCR: POSITIVE — AB

## 2019-01-29 LAB — LACTIC ACID, PLASMA: Lactic Acid, Venous: 0.6 mmol/L (ref 0.5–1.9)

## 2019-01-29 LAB — PREPARE RBC (CROSSMATCH)

## 2019-01-29 LAB — MAGNESIUM: Magnesium: 2 mg/dL (ref 1.7–2.4)

## 2019-01-29 LAB — TRIGLYCERIDES: Triglycerides: 243 mg/dL — ABNORMAL HIGH (ref <150)

## 2019-01-29 SURGERY — IR WITH ANESTHESIA
Anesthesia: General

## 2019-01-29 MED ORDER — ONDANSETRON HCL 4 MG/2ML IJ SOLN
INTRAMUSCULAR | Status: DC | PRN
  Administered 2019-01-29: 4 mg via INTRAVENOUS

## 2019-01-29 MED ORDER — ACETAMINOPHEN 160 MG/5ML PO SOLN: 650.0000 mg | ORAL | Status: DC | PRN

## 2019-01-29 MED ORDER — PROPOFOL 500 MG/50ML IV EMUL
INTRAVENOUS | Status: DC | PRN
  Administered 2019-01-29: 50 ug/kg/min via INTRAVENOUS

## 2019-01-29 MED ORDER — IOPAMIDOL (ISOVUE-370) INJECTION 76%
100.0000 mL | Freq: Once | INTRAVENOUS | Status: AC | PRN
  Administered 2019-01-29: 100 mL via INTRAVENOUS

## 2019-01-29 MED ORDER — SUCCINYLCHOLINE CHLORIDE 20 MG/ML IJ SOLN
INTRAMUSCULAR | Status: DC | PRN
  Administered 2019-01-29: 100 mg via INTRAVENOUS

## 2019-01-29 MED ORDER — MUPIROCIN 2 % EX OINT
1.0000 "application " | TOPICAL_OINTMENT | Freq: Two times a day (BID) | CUTANEOUS | Status: AC
  Administered 2019-01-29 – 2019-02-02 (×10): 1 via NASAL
  Filled 2019-01-29: qty 22

## 2019-01-29 MED ORDER — PANTOPRAZOLE SODIUM 40 MG IV SOLR
40.0000 mg | INTRAVENOUS | Status: DC
  Administered 2019-01-29 – 2019-02-02 (×5): 40 mg via INTRAVENOUS
  Filled 2019-01-29 (×5): qty 40

## 2019-01-29 MED ORDER — STROKE: EARLY STAGES OF RECOVERY BOOK
Freq: Once | Status: DC
  Filled 2019-01-29 (×2): qty 1

## 2019-01-29 MED ORDER — CHLORHEXIDINE GLUCONATE 0.12% ORAL RINSE (MEDLINE KIT)
15.0000 mL | Freq: Two times a day (BID) | OROMUCOSAL | Status: DC
  Administered 2019-01-29 – 2019-02-02 (×8): 15 mL via OROMUCOSAL

## 2019-01-29 MED ORDER — DILTIAZEM 12 MG/ML ORAL SUSPENSION
30.0000 mg | Freq: Four times a day (QID) | ORAL | Status: DC
  Administered 2019-01-29 – 2019-02-03 (×11): 30 mg
  Filled 2019-01-29 (×21): qty 3

## 2019-01-29 MED ORDER — DOCUSATE SODIUM 50 MG/5ML PO LIQD
100.0000 mg | Freq: Two times a day (BID) | ORAL | Status: DC
  Administered 2019-01-29 – 2019-02-06 (×13): 100 mg
  Filled 2019-01-29 (×13): qty 10

## 2019-01-29 MED ORDER — CHLORHEXIDINE GLUCONATE CLOTH 2 % EX PADS
6.0000 | MEDICATED_PAD | Freq: Every day | CUTANEOUS | Status: AC
  Administered 2019-01-30 – 2019-02-03 (×5): 6 via TOPICAL

## 2019-01-29 MED ORDER — FENTANYL CITRATE (PF) 100 MCG/2ML IJ SOLN
50.0000 ug | INTRAMUSCULAR | Status: AC | PRN
  Administered 2019-01-29 – 2019-01-30 (×3): 50 ug via INTRAVENOUS
  Filled 2019-01-29 (×2): qty 2

## 2019-01-29 MED ORDER — HYDRALAZINE HCL 20 MG/ML IJ SOLN: 5.0000 mg | INTRAMUSCULAR | Status: DC | PRN

## 2019-01-29 MED ORDER — OMEGA-3-ACID ETHYL ESTERS 1 G PO CAPS
2.0000 g | ORAL_CAPSULE | Freq: Two times a day (BID) | ORAL | Status: DC
  Administered 2019-01-29 – 2019-02-05 (×8): 2 g
  Filled 2019-01-29 (×8): qty 2

## 2019-01-29 MED ORDER — BUDESONIDE 0.25 MG/2ML IN SUSP
0.2500 mg | Freq: Two times a day (BID) | RESPIRATORY_TRACT | Status: DC
  Administered 2019-01-29 – 2019-02-02 (×9): 0.25 mg via RESPIRATORY_TRACT
  Filled 2019-01-29 (×9): qty 2

## 2019-01-29 MED ORDER — PROPOFOL 10 MG/ML IV BOLUS
INTRAVENOUS | Status: DC | PRN
  Administered 2019-01-29: 100 mg via INTRAVENOUS

## 2019-01-29 MED ORDER — ROCURONIUM BROMIDE 100 MG/10ML IV SOLN
INTRAVENOUS | Status: DC | PRN
  Administered 2019-01-29: 50 mg via INTRAVENOUS

## 2019-01-29 MED ORDER — SODIUM CHLORIDE 0.9 % IV SOLN
INTRAVENOUS | Status: DC
  Administered 2019-01-29: 08:00:00 via INTRAVENOUS

## 2019-01-29 MED ORDER — VANCOMYCIN HCL 1000 MG IV SOLR
INTRAVENOUS | Status: DC | PRN
  Administered 2019-01-29: 1000 mg via INTRAVENOUS

## 2019-01-29 MED ORDER — SODIUM CHLORIDE (PF) 0.9 % IJ SOLN
INTRAVENOUS | Status: AC | PRN
  Administered 2019-01-29 (×3): 25 ug via INTRA_ARTERIAL

## 2019-01-29 MED ORDER — IPRATROPIUM-ALBUTEROL 0.5-2.5 (3) MG/3ML IN SOLN
3.0000 mL | Freq: Four times a day (QID) | RESPIRATORY_TRACT | Status: DC
  Administered 2019-01-29 – 2019-02-02 (×17): 3 mL via RESPIRATORY_TRACT
  Filled 2019-01-29 (×17): qty 3

## 2019-01-29 MED ORDER — PHENYLEPHRINE HCL 10 MG/ML IJ SOLN
INTRAMUSCULAR | Status: DC | PRN
  Administered 2019-01-29: 80 ug via INTRAVENOUS

## 2019-01-29 MED ORDER — NITROGLYCERIN 1 MG/10 ML FOR IR/CATH LAB
INTRA_ARTERIAL | Status: AC
  Filled 2019-01-29: qty 10

## 2019-01-29 MED ORDER — ACETAMINOPHEN 650 MG RE SUPP: 650.0000 mg | RECTAL | Status: DC | PRN

## 2019-01-29 MED ORDER — ORAL CARE MOUTH RINSE
15.0000 mL | OROMUCOSAL | Status: DC
  Administered 2019-01-29 – 2019-02-02 (×35): 15 mL via OROMUCOSAL

## 2019-01-29 MED ORDER — ACETAMINOPHEN 325 MG PO TABS: 650.0000 mg | ORAL_TABLET | ORAL | Status: DC | PRN

## 2019-01-29 MED ORDER — INSULIN ASPART 100 UNIT/ML ~~LOC~~ SOLN
2.0000 [IU] | SUBCUTANEOUS | Status: DC
  Administered 2019-01-29 – 2019-01-30 (×2): 4 [IU] via SUBCUTANEOUS
  Administered 2019-01-30 – 2019-02-02 (×13): 2 [IU] via SUBCUTANEOUS

## 2019-01-29 MED ORDER — CEFAZOLIN SODIUM-DEXTROSE 2-4 GM/100ML-% IV SOLN
INTRAVENOUS | Status: AC
  Filled 2019-01-29: qty 100

## 2019-01-29 MED ORDER — SODIUM CHLORIDE 0.9% IV SOLUTION: Freq: Once | INTRAVENOUS | Status: DC

## 2019-01-29 MED ORDER — LIDOCAINE HCL 1 % IJ SOLN
INTRAMUSCULAR | Status: AC
  Filled 2019-01-29: qty 20

## 2019-01-29 MED ORDER — VANCOMYCIN HCL IN DEXTROSE 1-5 GM/200ML-% IV SOLN
INTRAVENOUS | Status: AC
  Filled 2019-01-29: qty 200

## 2019-01-29 MED ORDER — SODIUM CHLORIDE 0.9 % IV SOLN
INTRAVENOUS | Status: DC | PRN
  Administered 2019-01-29: 40 ug/min via INTRAVENOUS

## 2019-01-29 MED ORDER — SODIUM CHLORIDE 0.9 % IV SOLN
INTRAVENOUS | Status: DC
  Administered 2019-01-29 – 2019-01-30 (×3): via INTRAVENOUS

## 2019-01-29 MED ORDER — FENTANYL CITRATE (PF) 100 MCG/2ML IJ SOLN
50.0000 ug | INTRAMUSCULAR | Status: DC | PRN
  Administered 2019-01-31 – 2019-02-02 (×7): 50 ug via INTRAVENOUS
  Filled 2019-01-29 (×9): qty 2

## 2019-01-29 MED ORDER — SUGAMMADEX SODIUM 200 MG/2ML IV SOLN
INTRAVENOUS | Status: DC | PRN
  Administered 2019-01-29: 200 mg via INTRAVENOUS

## 2019-01-29 MED ORDER — CLEVIDIPINE BUTYRATE 0.5 MG/ML IV EMUL
0.0000 mg/h | INTRAVENOUS | Status: DC
  Administered 2019-01-29 – 2019-01-30 (×3): 2 mg/h via INTRAVENOUS
  Administered 2019-01-30: 4 mg/h via INTRAVENOUS
  Administered 2019-01-30: 6 mg/h via INTRAVENOUS
  Filled 2019-01-29 (×6): qty 50

## 2019-01-29 MED ORDER — PROPOFOL 1000 MG/100ML IV EMUL
0.0000 ug/kg/min | INTRAVENOUS | Status: DC
  Administered 2019-01-29: 50 ug/kg/min via INTRAVENOUS
  Administered 2019-01-29 – 2019-01-30 (×3): 40 ug/kg/min via INTRAVENOUS
  Filled 2019-01-29: qty 200
  Filled 2019-01-29 (×3): qty 100

## 2019-01-29 MED ORDER — LIDOCAINE HCL (CARDIAC) PF 100 MG/5ML IV SOSY
PREFILLED_SYRINGE | INTRAVENOUS | Status: DC | PRN
  Administered 2019-01-29: 60 mg via INTRATRACHEAL

## 2019-01-29 MED ORDER — FENTANYL CITRATE (PF) 100 MCG/2ML IJ SOLN
INTRAMUSCULAR | Status: DC | PRN
  Administered 2019-01-29: 100 ug via INTRAVENOUS

## 2019-01-29 NOTE — Progress Notes (Signed)
Angelica [name] rt ENROUTE TO TRANSPORT VENT

## 2019-01-29 NOTE — Progress Notes (Addendum)
Consulted for hyperkalemia, acute kidney injury.  Patient was admitted by orthopedics, for humeral fracture.  Earlier today, patient was noted to have left-sided weakness with left-sided facial droop and slurred speech.  Code stroke was called, neurology consulted.  CT head unremarkable.  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  Patient was found to not be a candidate for IV TPA or IA TPA given her recent surgery for her surgery.  Also noted to have anemia today, blood transfusion ordered.  Currently patient is intubated and in the ICU.  PCCM will assume care.  TRH sign off.

## 2019-01-29 NOTE — Progress Notes (Signed)
Blood pressure continues to rise, IV sites limited and incompatible with current medication. Paged doctor regarding blood pressure management. Will continue to monitor. Dicie Beam RN BSN.

## 2019-01-29 NOTE — Transfer of Care (Signed)
Immediate Anesthesia Transfer of Care Note  Patient: Angelica [name]  Procedure(s) Performed: IR WITH ANESTHESIA (N/A )  Patient Location: PACU  Anesthesia Type:General  Level of Consciousness: sedated and Patient remains intubated per anesthesia plan  Airway & Oxygen Therapy: Patient remains intubated per anesthesia plan and Patient placed on Ventilator (see vital sign flow sheet for setting)  Post-op Assessment: Report given to RN and Post -op Vital signs reviewed and stable  Post vital signs: Reviewed and stable  Last Vitals:  Vitals Value Taken Time  BP    Temp    Pulse 70 02-13-19 10:44 AM  Resp 16 February 13, 2019 10:44 AM  SpO2 100 % 02/13/2019 10:44 AM  Vitals shown include unvalidated device data.  Last Pain:  Vitals:   02-13-19 0314  TempSrc: Oral  PainSc:          Complications: No apparent anesthesia complications

## 2019-01-29 NOTE — Anesthesia Postprocedure Evaluation (Signed)
Anesthesia Post Note  Patient: Angelica [name]  Procedure(s) Performed: IR WITH ANESTHESIA (N/A )     Patient location during evaluation: SICU Anesthesia Type: General Level of consciousness: sedated Pain management: pain level controlled Vital Signs Assessment: post-procedure vital signs reviewed and stable Respiratory status: patient remains intubated per anesthesia plan Cardiovascular status: stable Postop Assessment: no apparent nausea or vomiting Anesthetic complications: no    Last Vitals:  Vitals:   02/03/2019 1112 01/19/2019 1127  BP:    Pulse: 71 70  Resp: 15 16  Temp:    SpO2: 100% 100%    Last Pain:  Vitals:   01/20/2019 0314  TempSrc: Oral  PainSc:                  Mercadez Heitman,W. EDMOND

## 2019-01-29 NOTE — Progress Notes (Signed)
Patient was transported to 4N26 without any complications.  

## 2019-01-29 NOTE — Progress Notes (Signed)
CRITICAL VALUE ALERT  Critical Value:  Hg 6.1  Date & Time Notied:  02/14/19, @ 2637.  Provider Notified: Dr Catha Gosselin via Text page.  Orders Received/Actions taken: Pending.

## 2019-01-29 NOTE — Progress Notes (Signed)
6:41 nurse called to room patient noted to have slurred speech unable to move left arm, no sensation to left lower extremity, vital taken BP 148/54, sats 99 on 2L, pulse 76 RR 18 , last seen normal 5:30am by nurse daughter notice garbled speech at 6:15am nurse was notified at 6:41am code stroke called stroke team arrived patient taken to CT scan MD Garmig notified and MD Catha Gosselin.

## 2019-01-29 NOTE — Consult Note (Signed)
Neurology Consultation  Reason for Consult: Code stroke for left-sided weakness, left facial droop, slurred speech Referring Physician: Dr. Amanda Pea  CC: Left facial droop, slurred speech, left-sided weakness  History is obtained from: Chart, patient's daughter at bedside, patient  HPI: Angelica [name] is a 73 y.o. 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  She is also being treated with antibiotics for leukocytosis. She had some confusion starting yesterday which was attributed to her rising BUN and creatinine.  She has been given fluids.  Morning labs not available at this time. Her daughter noted that around 6:15 AM she was in her usual state of health and shortly after she started noticing a left-sided facial droop, left upper and lower extremity weakness and numbness on the left side. A code stroke was called on the floor.  Patient was assessed on the floor in 5 N. She had difficulty with her gaze this with inability to look all the way to the left.  Her left eye did not cross the midline her right eye cross the midline slightly to the left.  Gaze to the right was normal.  She had left lower facial weakness, she had severe dysarthria and left upper and lower extremity weakness along with left-sided sensory loss. Due to the recent surgery 2 days ago, she was deemed to be not a candidate for IV TPA   LKW: 6:15 AM tpa given?: no, ORIF less than 48 hours ago Premorbid modified Rankin scale (mRS): 1  ROS: Unable to reliably perform  Past Medical History:  Diagnosis Date  . Arthritis   . Asthma   . COPD (chronic obstructive pulmonary disease) (HCC)   . Coronary artery disease   . Diabetes mellitus   . Hypertension     Family History   Problem Relation Age of Onset  . Pneumonia Mother   . Lupus Mother   . Lung cancer Father    Social History:   reports that she has quit smoking. Her smoking use included cigarettes. She has a 30.00 pack-year smoking history. She has never used smokeless tobacco. She reports that she does not drink alcohol or use drugs.  Medications  Current Facility-Administered Medications:  .  0.9 %  sodium chloride infusion, , Intravenous, Continuous, Milon Dikes, MD .  acetaminophen (TYLENOL) tablet 650 mg, 650 mg, Oral, Q6H PRN, Alwyn Ren, MD .  albuterol (PROVENTIL) (2.5 MG/3ML) 0.083% nebulizer solution 2.5 mg, 2.5 mg, Nebulization, Q4H PRN, Dominica Severin, MD, 2.5 mg at 01/28/19 1631 .  albuterol (PROVENTIL) (2.5 MG/3ML) 0.083% nebulizer solution 3 mL, 3 mL, Inhalation, Q6H PRN, Dominica Severin, MD .  alum & mag hydroxide-simeth (MAALOX/MYLANTA) 200-200-20 MG/5ML suspension 30 mL, 30 mL, Oral, Q6H PRN, Bodenheimer, Charles A, NP, 30 mL at 01/27/19 0124 .  bisoprolol (ZEBETA) tablet 5 mg, 5 mg, Oral, Daily, Dominica Severin, MD, 5 mg at 01/28/19 0901 .  cyclobenzaprine (FLEXERIL) tablet 10 mg, 10 mg, Oral, QHS, Dominica Severin, MD, 10 mg at 01/28/19 2347 .  diltiazem (CARDIZEM CD) 24 hr capsule 120 mg, 120 mg, Oral, QHS, Dominica Severin, MD, 120 mg at 01/28/19 2345 .  docusate sodium (COLACE) capsule 100 mg, 100 mg, Oral, BID, Dominica Severin, MD, 100 mg at 01/28/19 2345 .  DULoxetine (CYMBALTA) DR capsule 60 mg, 60 mg, Oral, Daily,  Dominica SeverinGramig, William, MD, 60 mg at 01/28/19 16100902 .  insulin aspart (novoLOG) injection 0-9 Units, 0-9 Units, Subcutaneous, Q4H, Doutova, Anastassia, MD, 3 Units at 01/28/19 2040 .  mometasone-formoterol (DULERA) 200-5 MCG/ACT inhaler 2 puff, 2 puff, Inhalation, BID, Dominica SeverinGramig, William, MD, 2 puff at 01/28/19 2055 .  omega-3 acid ethyl esters (LOVAZA) capsule 2 g, 2 g, Oral, BID, Scarlett Prestogan, Theresa D, RPH, 2 g at 01/28/19 2345 .  ondansetron (ZOFRAN) tablet 4 mg, 4 mg,  Oral, Q6H PRN **OR** ondansetron (ZOFRAN) injection 4 mg, 4 mg, Intravenous, Q6H PRN, Dominica SeverinGramig, William, MD .  pantoprazole (PROTONIX) EC tablet 40 mg, 40 mg, Oral, Daily, Dominica SeverinGramig, William, MD, 40 mg at 01/28/19 0902 .  promethazine (PHENERGAN) suppository 12.5 mg, 12.5 mg, Rectal, Q6H PRN, Dominica SeverinGramig, William, MD .  umeclidinium bromide (INCRUSE ELLIPTA) 62.5 MCG/INH 1 puff, 1 puff, Inhalation, Daily, Scarlett Prestogan, Theresa D, RPH, 1 puff at 01/28/19 [id number] .  vitamin C (ASCORBIC ACID) tablet 1,000 mg, 1,000 mg, Oral, Daily, Dominica SeverinGramig, William, MD, 1,000 mg at 01/28/19 0902  Exam: Current vital signs: BP 124/86 (BP Location: Left Arm)   Pulse 73   Temp 97.8 F (36.6 C) (Oral)   Resp 18   Ht 5\' 1"  (1.549 m)   Wt 86 kg   SpO2 100%   BMI 35.82 kg/m  Vital signs in last 24 hours: Temp:  [97.8 F (36.6 C)-98.3 F (36.8 C)] 97.8 F (36.6 C) (02/11 0314) Pulse Rate:  [73-78] 73 (02/11 0314) Resp:  [18] 18 (02/11 0314) BP: (118-124)/(51-86) 124/86 (02/11 0314) SpO2:  [95 %-100 %] 100 % (02/11 0314) General: Awake alert in no distress HEENT: Normocephalic atraumatic dry mucous membranes Lungs: Clear to auscultation Cardiovascular: S1-S2 heard regular rate rhythm  Respiratory: Chest clear to auscultation but breathing abdominally Abdomen: Nondistended nontender: Extremities: Right upper extremity in cast, trace edema in both lower extremities. Neurological exam Patient awake, alert, oriented to self and fact she is in the hospital.  Could not tell me the month.  Told me her age correctly. Naming comprehension and repetition are intact. Speech is severely dysarthric. Cranial nerves: Pupils are equal round reactive to light, extraocular movement exam shows inability to gaze to the left.  She cannot cross midline to look to the left.  No visual field cuts based on confrontation.  Complete left lower facial paralysis.  Palate midline.  Tongue midline. Motor exam: Right upper extremity in cast and difficult  examined.  2/5 left upper extremity.  3/5 left lower extremity.  5/5 right lower extremity. Sensory exam: Significant loss of light touch sensation on the left.  Possible extinguishing also. Coordination: Unable to perform on the left.  Right arm in the cast-unable to perform Gait testing deferred   NIHSS 1a Level of Conscious.: 0 1b LOC Questions: 0 1c LOC Commands: 0 2 Best Gaze: 1 3 Visual: 0 4 Facial Palsy: 2 5a Motor Arm - left: 3 5b Motor Arm - Right: 0 6a Motor Leg - Left: 2 6b Motor Leg - Right: 0 7 Limb Ataxia: 0 8 Sensory: 2 9 Best Language: 0 10 Dysarthria: 2 11 Extinct. and Inatten.: 1 TOTAL: 13 Labs I have reviewed labs in epic and the results pertinent to this consultation are:  CBC    Component Value Date/Time   WBC 16.5 (H) 01/28/2019 0352   RBC 2.65 (L) 01/28/2019 0352   HGB 7.9 (L) 01/28/2019 0352   HGB 9.7 (L) 01/08/2019 0925   HGB 10.4 (L) 11/29/2018 1519   HGB  10.3 (L) 11/17/2017 1116   HCT 25.7 (L) 01/28/2019 0352   HCT 32.3 (L) 11/29/2018 1519   HCT 32.7 (L) 11/17/2017 1116   PLT 439 (H) 01/28/2019 0352   PLT 576 (H) 01/08/2019 0925   PLT 662 (H) 11/29/2018 1519   MCV 97.0 01/28/2019 0352   MCV 94 11/29/2018 1519   MCV 93 11/17/2017 1116   MCH 29.8 01/28/2019 0352   MCHC 30.7 01/28/2019 0352   RDW 14.3 01/28/2019 0352   RDW 13.8 11/29/2018 1519   RDW 17.5 (H) 11/17/2017 1116   LYMPHSABS 0.9 01/26/2019 0337   LYMPHSABS 1.7 11/17/2017 1116   MONOABS 1.0 01/26/2019 0337   EOSABS 0.1 01/26/2019 0337   EOSABS 0.1 11/17/2017 1116   BASOSABS 0.0 01/26/2019 0337   BASOSABS 0.0 11/17/2017 1116    CMP     Component Value Date/Time   NA 132 (L) 01/28/2019 0352   NA 139 11/29/2018 1519   NA 140 10/06/2017 1444   K 4.8 01/28/2019 0352   K 3.8 10/06/2017 1444   CL 97 (L) 01/28/2019 0352   CL 99 10/06/2017 1444   CO2 20 (L) 01/28/2019 0352   CO2 29 10/06/2017 1444   GLUCOSE 147 (H) 01/28/2019 0352   GLUCOSE 228 (H) 10/06/2017 1444   BUN  57 (H) 01/28/2019 0352   BUN 39 (H) 11/29/2018 1519   BUN 16 10/06/2017 1444   CREATININE 2.84 (H) 01/28/2019 0352   CREATININE 0.7 10/06/2017 1444   CALCIUM 8.4 (L) 01/28/2019 0352   CALCIUM 9.7 10/06/2017 1444   PROT 7.0 01/22/2019 2255   PROT 7.5 10/06/2017 1444   ALBUMIN 3.1 (L) 01/27/2019 2255   ALBUMIN 3.5 10/06/2017 1444   AST 16 02/08/2019 2255   AST 21 10/06/2017 1444   ALT 15 02/15/2019 2255   ALT 20 10/06/2017 1444   ALKPHOS 37 (L) 02/07/2019 2255   ALKPHOS 56 10/06/2017 1444   BILITOT 0.7 02/09/2019 2255   BILITOT 0.40 10/06/2017 1444   GFRNONAA 16 (L) 01/28/2019 0352   GFRAA 18 (L) 01/28/2019 0352    Imaging I have reviewed the images obtained:  CT-scan of the brain-aspects 10. CTA head and neck- right M2 proximal severe stenosis versus occlusion.  Bilateral intracranial ICA stenosis mild.  No cervical carotid artery stenosis.  Moderate right vertebral artery origin stenosis. CT perfusion with a 10 cc penumbra no core in the right MCA territory corresponding to the right M2 stenosis/occlusion.  Assessment: 73 year old left-handed woman with past medical history as above who recently underwent open reduction internal fixation of the right upper extremity humeral fracture, with sudden onset of left facial droop, dysarthria and left hemiparesis with a last known normal at 6:15 AM today. Noncontrast CT with no evidence of bleed. CTA head and neck with severe right M2 stenosis versus complete occlusion. 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 No TPA due to recent surgery with plates in place. Due to the proximal vascular stenosis versus occlusion, case discussed with endovascular, Dr. Corliss Skainseveshwar as well as patient's daughter and patient taken for a diagnostic cerebral angiogram with the intent to perform angioplasty versus thrombectomy as deemed appropriate after the diagnostic angiogram  Impression: Acute ischemic stroke-etiology under  investigation Currently undergoing diagnostic cerebral angiogram  Recommendations: Transfer patient to 4 N. ICU after the angiogram Post angiogram care per IR MRI brain when able 2D echo Aspirin when okay with orthopedics Atorvastatin 80 A1c Lipid panel Frequent neurochecks PT OT Speech therapy N.p.o. until cleared by bedside  swallow or formal swallow evaluation Stroke team will follow with you.  -- Milon Dikes, MD [facility] Pager: [phone] If 7pm to 7am, please call on call as listed on AMION.   CRITICAL CARE ATTESTATION Performed by: Milon Dikes, MD Total critical care time: 60 minutes Critical care time was exclusive of separately billable procedures and treating other patients and/or supervising APPs/Residents/Students Critical care was necessary to treat or prevent imminent or life-threatening deterioration due to acute ischemic stroke This patient is critically ill and at significant risk for neurological worsening and/or death and care requires constant monitoring. Critical care was time spent personally by me on the following activities: development of treatment plan with patient and/or surrogate as well as nursing, discussions with consultants, evaluation of patient's response to treatment, examination of patient, obtaining history from patient or surrogate, ordering and performing treatments and interventions, ordering and review of laboratory studies, ordering and review of radiographic studies, pulse oximetry, re-evaluation of patient's condition, participation in multidisciplinary rounds and medical decision making of high complexity in the care of this patient.

## 2019-01-29 NOTE — Progress Notes (Signed)
Called from microbiology regarding patients positive MRSA screen. Contact precautions initiated, family at bedside educated with precautions. All questions answered. Will continue to monitor. Dicie BeamFrazier, Brenner Visconti RN BSN

## 2019-01-29 NOTE — Anesthesia Preprocedure Evaluation (Signed)
Anesthesia Evaluation  Patient identified by MRN, date of birth, ID band Patient awake    Reviewed: Allergy & Precautions, H&P , NPO status , Patient's Chart, lab work & pertinent test results, reviewed documented beta blocker date and time   Airway Mallampati: III  TM Distance: >3 FB Neck ROM: Full    Dental no notable dental hx. (+) Teeth Intact, Dental Advisory Given   Pulmonary asthma , COPD,  COPD inhaler, former smoker,    Pulmonary exam normal breath sounds clear to auscultation       Cardiovascular hypertension, Pt. on medications and Pt. on home beta blockers  Rhythm:Regular Rate:Normal     Neuro/Psych CVA, Residual Symptoms negative psych ROS   GI/Hepatic negative GI ROS, Neg liver ROS,   Endo/Other  diabetes, Type 2, Oral Hypoglycemic AgentsMorbid obesity  Renal/GU negative Renal ROS  negative genitourinary   Musculoskeletal  (+) Arthritis , Osteoarthritis,    Abdominal   Peds  Hematology  (+) Blood dyscrasia, anemia ,   Anesthesia Other Findings   Reproductive/Obstetrics negative OB ROS                             Anesthesia Physical Anesthesia Plan  ASA: III and emergent  Anesthesia Plan: General   Post-op Pain Management:    Induction: Intravenous  PONV Risk Score and Plan: 3 and Ondansetron and Treatment may vary due to age or medical condition  Airway Management Planned: Oral ETT  Additional Equipment: Arterial line  Intra-op Plan:   Post-operative Plan: Post-operative intubation/ventilation  Informed Consent: I have reviewed the patients History and Physical, chart, labs and discussed the procedure including the risks, benefits and alternatives for the proposed anesthesia with the patient or authorized representative who has indicated his/her understanding and acceptance.     Dental advisory given  Plan Discussed with: CRNA  Anesthesia Plan Comments:          Anesthesia Quick Evaluation

## 2019-01-29 NOTE — Progress Notes (Signed)
OT Cancellation Note  Patient Details Name: Angelica [name] MRN: [id number] DOB: 1946-10-03   Cancelled Treatment:    Reason Eval/Treat Not Completed: Medical issues which prohibited therapy;Patient not medically ready. Pt with medical decline. Will need OT re-evaluation when medically appropriate.   Thornell Mule, OT/L   Acute OT Clinical Specialist Acute Rehabilitation Services Pager [phone] Office [phone]  , 2:28 PM

## 2019-01-29 NOTE — Anesthesia Procedure Notes (Signed)
Arterial Line Insertion Start/End02-24-2020 8:29 AM, February 11, 2019 8:33 AM Performed by: Jeani Hawking, CRNA, CRNA  Patient location: OOR procedure area. Preanesthetic checklist: patient identified, IV checked, site marked, risks and benefits discussed, surgical consent, monitors and equipment checked, pre-op evaluation, timeout performed and anesthesia consent Left, radial was placed Catheter size: 20 G Maximum sterile barriers used   Attempts: 1 Procedure performed without using ultrasound guided technique. Following insertion, Biopatch and dressing applied. Post procedure assessment: normal  Patient tolerated the procedure well with no immediate complications.

## 2019-01-29 NOTE — Progress Notes (Signed)
Patient ID: Angelica [name], female   DOB: 02-Jan-1946, 73 y.o.   MRN: [id number] INR . Post procedure CT brain no ICH or mass effect. RT groin soft.. No hematoma. Distal pulses RT DP and PT dopplerable. Lt DP palpable and Lt PT dopplerable.. Patient left intubated per anesthesia because of COPD. S.Weslie Rasmus MD

## 2019-01-29 NOTE — Plan of Care (Signed)
Care plan and education updated for shift. Cattaleya Wien RN BSN. 

## 2019-01-29 NOTE — Procedures (Signed)
S/P RT common carotid arteriogram followed by revascularization of occluded branch of the RT MCA sup division achieving a TICI  3 revascularization with reocclusion due to underlying arteriosclerosis.Marland Kitchen Rescue stenting not performed due the small diameter of the the involved  branch,and potential increased risk of ICH with adjuvant dual antiplatelets. Patients HG 6.1 today.

## 2019-01-29 NOTE — Code Documentation (Signed)
Night time RRT RN Langley Gauss called to bedside on 5N at [phone] via Code stroke page.  She was met at the bedside by Dr. Rory Percy.  Patient was LSW around [phone] by daughter - around 15 daughter  noticied some left side weakness and slurred speech and code stroke was called.  Patient was met in CT scan by this RN.  Her initial NIHHS is 15 - right gaze preference - left arm and leg weakness - left facial droop - left total sensation loss face,arm and leg, dysarthria, left neglect and no blink to threat on left.  CT negative for bleed.  #20 diffusics  IV catheter placed  to left antecubital - patient remains alert - somewhat agitated with lying flat and CT scan - O2 sats remain 100% on 4 liters nasal cannula - abdominal breathing- daughter reports she does abdominal breath - just worse with lying flat - BP stable 139/68 CBG checked 138.  CTA and CTP completed.  Dr. Rory Percy speaking with daughter and patient and Dr. Estanislado Pandy. 0800 to IR for procedure - handoff to [employer] - daughter supported.  ICU bed requested by MD.

## 2019-01-29 NOTE — Sedation Documentation (Signed)
Right groin sheath removed. V-pad applied. 

## 2019-01-29 NOTE — Progress Notes (Signed)
Critical HgB from ABG given to Raynelle Dick RN

## 2019-01-29 NOTE — Progress Notes (Signed)
Patient ID: Angelica [name], female   DOB: 03/13/1946, 73 y.o.   MRN: [id number] INR. 62 Y RH F LSW  615 am. New onset of Lt sided weakness and numbness. and RT gaze deviation. CT head NO ICH  ASPECTS 10  CTA occluded branch of the superior division of RT MCA. CTP maps No core with  Tmax>6.0 s 10cc.  Option of diagnostic arteriogram with possible endovascular revascularization D/W daughter. Patient not  a candidate for IVtpa or IA tpa given recent surgery to her shoulder.. Endovascular option may be performed after assessing the diagnotic angiogram results . Reasons ,procedure and alternatives were discussed. Risk of ICH of 10 %  And worsening neuro function and inability to revascularize were discussed. Informed witnessed consent was obtained. S.Jaeliana Lococo MD

## 2019-01-29 NOTE — Progress Notes (Signed)
PT Cancellation Note  Patient Details Name: Angelica [name] MRN: [id number] DOB: 1946-08-24   Cancelled Treatment:    Reason Eval/Treat Not Completed: Medical issues which prohibited therapy   CVA with revascularization currently;   Will hold PT today, and anticipate need for PT Re-evaluation when appropriate for mobilizing;   Van Clines, PT  Acute Rehabilitation Services Pager [phone] Office [phone]    Levi Aland 02/02/2019, 10:46 AM

## 2019-01-29 NOTE — Progress Notes (Signed)
Patient ID: Angelica [name], female   DOB: 07/21/1946, 73 y.o.   MRN: [id number] Patient seen at bedside tonight Unfortunate events noted Right arm is stable at this juncture in her splint. Have discussed with family at length as have other physicians today We will continue observation of the right upper extremity.  Appreciate everyone's help with this nice lady Angelica Nightengale MD

## 2019-01-29 NOTE — Consult Note (Addendum)
NAME:  Angelica [name], MRN:  [id number], DOB:  08/07/1946, LOS: 4 ADMISSION DATE:  01/23/2019, CONSULTATION DATE:  01/26/2019 REFERRING MD:  Wilford CornerArora CHIEF COMPLAINT:   Brief History   73 year old female past medical history of tobacco abuse, 30 pack years, no quit date, diabetes, coronary disease, COPD, hypertension and hyperlipidemia who recently underwent open reduction internal fixation of the right upper extremity humeral fracture, with sudden onset of left facial droop, dysarthria and left hemiparesis . CTA brain revealed scan positive for severe proximal right M2 stenosis. Right parietal lobe ischemic penumbra without core infarct evident on CTP. Mild bilateral intracranial ICA stenoses. She underwent RT common carotid arteriogram followed by  re-vascularization of occluded branch of the RT MCA sup division achieving a TICI  3 revascularization with reocclusion due to underlying arteriosclerosis.PCCM have been consulted for vent management in patient with known history of COPD.Marland Kitchen. History of present illness   History obtained from medical record and family, as patient is intubated and sedated.  73 year old female past medical history of tobacco abuse, 30 pack years, no quit date, diabetes, coronary disease, COPD, hypertension and hyperlipidemia who recently underwent open reduction internal fixation of the right upper extremity humeral fracture, with sudden onset of left facial droop, dysarthria and left hemiparesis with right gaze deviation with a last known normal at 6:15 AM 01/24/2019. CTA head and neck with severe right M2 stenosis versus complete occlusion. 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 No TPA due to recent surgery with plates in place. Due to the proximal vascular stenosis versus occlusion, case discussed with endovascular, Dr. Corliss Skainseveshwar as well as patient's daughter and patient taken for a diagnostic cerebral angiogram with the intent to perform angioplasty versus  thrombectomy as deemed appropriate after the diagnostic angiogram.She underwent  RT common carotid arteriogram followed by revascularization of occluded branch of the RT MCA sup division achieving a TICI  3 revascularization with reocclusion due to underlying arteriosclerosis.Marland Kitchen. Rescue stenting not performed due the small diameter of the the involved branch,and potential increased risk of ICH with adjuvant dual antiplatelets. Patients HG 6.1 on 2/11 She was intubated by anesthesia due to her history of COPD. PCCM have been asked to consult for vent management with goal of extubation.  Past Medical History    Past Medical History:  Diagnosis Date  . Arthritis   . Asthma   . COPD (chronic obstructive pulmonary disease) (HCC)   . Coronary artery disease   . Diabetes mellitus   . Hypertension    Family History Family History  Problem Relation Age of Onset  . Pneumonia Mother   . Lupus Mother   . Lung cancer Father     Significant Hospital Events   01/20/2019  Admission for open reduction internal fixation of the right upper extremity humeral fracture  01/21/2019 Developed facial droop, Middle cerebral artery embolism, right   02/07/2019 RT common carotid arteriogram followed by revascularization of occluded branch of the RT MCA sup division achieving a TICI  3 revascularization with reocclusion due to underlying arteriosclerosis .Marland Kitchen.  Consults:  02/04/2019 PCCM  Procedures:  01/28/2019 S/P RT common carotid arteriogram followed by revascularization of occluded branch of the RT MCA sup division achieving a TICI  3 revascularization with reocclusion due to underlying arteriosclerosis.Marland Kitchen. Rescue stenting not performed due the small diameter of the the involved branch,and potential increased risk of ICH with adjuvant dual antiplatelets. Patients HG 6.1 on 2/11  Significant Diagnostic Tests:   CT Angio  Head> 02/14/2019 Negative for large vessel occlusion but positive for severe proximal right  M2 stenosis. Right parietal lobe ischemic penumbra without core infarct evident on CTP. Mild bilateral intracranial ICA stenoses. No cervical carotid artery stenosis. Moderate right vertebral artery origin stenosis. Aortic Atherosclerosis (ICD10-I70.0).  Post Procedure Ct Brain  02/10/2019 No ICH or mass effect.  CXR 02/08/2019 Hypoinflation with linear density left base likely atelectasis. Micro Data:  01/19/2019>> MRSA PCR>> Negative 01/28/2019>> Urine Culture>>  Antimicrobials:  None   Interim history/subjective:  Remains intubated and sedated on propofol and fentanyl. Receiving blood for HGB of 6.1 Hypotensive after receiving fentanyl dose PH remains 7.28, CO2 of 50.9  Objective   Blood pressure (!) 122/54, pulse 74, temperature (!) 97.2 F (36.2 C), temperature source Axillary, resp. rate 16, height 5\' 1"  (1.549 m), weight 86 kg, SpO2 99 %.    Vent Mode: PRVC FiO2 (%):  [50 %] 50 % Set Rate:  [16 bmp-20 bmp] 20 bmp Vt Set:  [380 mL-450 mL] 380 mL PEEP:  [5 cmH20] 5 cmH20 Plateau Pressure:  [17 cmH20-20 cmH20] 17 cmH20   Intake/Output Summary (Last 24 hours) at 01/21/2019 1300 Last data filed at 01/22/2019 1204 Gross per 24 hour  Intake 1170 ml  Output 1375 ml  Net -205 ml   Filed Weights   02/01/2019 1139  Weight: 86 kg    Examination: General: Sedated and  Intubated, post re-vascularization, NAD HENT: NCAT, No JVD, No LAD Lungs: Bilateral excursion, coarse throughout, diminished per bases Cardiovascular: S1, S2, RRR, No RMG Abdomen: Soft, NT, ND, BS +, Obese, Body mass index is 35.82 kg/m. Extremities: No edema, normal bulk, no obvious deformities, brisk refill nailbeds, cast and sling to right arm  Neuro: Sedated and intubated post procedure, not following commands, no movement of extremities even with sternal rub ( she is heavily sedated)  Resolved Hospital Problem list     Assessment & Plan:  Acute Ischemic Stroke Underwent  RT common carotid  arteriogram followed by revascularization of occluded branch of the RT MCA sup division achieving a TICI  3 revascularization with reocclusion due to underlying arteriosclerosis.. Plan Per Neuro MRI brain per neuro ( Most likely in am) 2D echo BP goal systolic 140-160 mm Hg Aspirin when okay with orthopedics Atorvastatin 80 A1c Lipid panel Frequent neurochecks PT OT Speech therapy N.P.O. until cleared by bedside swallow or formal swallow evaluation Stroke team to  follow    Acute on Chronic Respiratory Failure SP revascularization of occluded branch of the RT MCA sup division Hx of COPD >> Dulera  And Incruse as home maintenence No PFT's on file Acidosis per ABG Plan Full vent support for now Increase rate to 24 now ABG in 1 hour Titrate oxygen and PEEP as able Saturation goals 88-92% ( COPD) Sedation with propofol>>  RASS goal of -2 Prn fentanyl>> she appears to be sensitive to this, start with 25 mcg and repeat if needed  CXR in am and prn Scheduled BD ( Pulmicort/ DuoNebs) Albuterol prn Will need aggressive pulmonary toilet once extubated OOB to chair IS Q 1 Flutter valve  Hypotension 2/2 sedation >> fentanyl and propofol History of essential hypertension Appears to be sensitive to Fentanyl Acidosis per ABG Plan Minimize sedation as able A Line monitoring RASS goal - 2 as pressure tolerates MAP goal of > 65 mm Hg SBP goal is 120-140 mm Hg  Lactate now and 2/12 am  Iron deficiency Anemia HGB 6.1 Receiving 1 unit PRBC's Plan Transfuse for  HGB < 7 CBC 4 hours after transfusion complete CBC 2/11 at 2200 Monitor for obvious source of bleed   Acute on Chronic Renal Failure Hyperkalemia>> resolved Renal US>> NO hydro Creatinine 1.33 GFR 40 ml/ min Plan: Continue to Hold Torsemide Vanc d/c'd per Internal medicine Trend BMET and urine output dailt   Leukocytosis Afebrile Plan Trend WBC and Fever Curve Culture as is clinically indicated Trend  CXR Follow micro ( UA) Monitor off ABX for now   GI Plan NPO for now Pepcid as SUP Consider OG and TF 2/12 if not liberated from vent within next 24 hours  Endocrine DM Oral agents at home Plan Hold home medications CBG Q 4 SSI Check HGB A1C  Nonunion supracondylar humerus fracture, right upper extremity Per Ortho Plan Cast and sling per ortho Pulse and Cap refill checks  Q 6 and prn  Best practice:  Diet: NPO Pain/Anxiety/Delirium protocol (if indicated): Propofol and fentanyl VAP protocol (if indicated): NA DVT prophylaxis: SCD's hose  GI prophylaxis:Protonix Glucose control: CBG Q 4 with SSI Mobility: BR Code Status: Full Family Communication: No family at bedside Disposition: Neuro ICU  Labs   CBC: Recent Labs  Lab 02/02/2019 2255 01/26/19 0337 01/27/19 0340 01/28/19 0352 01/22/2019 0835 01/27/2019 1225  WBC 19.0* 18.3* 12.3* 16.5* 9.3  --   NEUTROABS  --  16.2*  --   --  7.6  --   HGB 9.9* 9.3* 8.6* 7.9* 6.1* 6.1*  HCT 31.9* 29.7* 28.1* 25.7* 20.2* 18.0*  MCV 96.7 97.7 99.3 97.0 99.5  --   PLT 529* 576* 514* 439* 372  --     Basic Metabolic Panel: Recent Labs  Lab 01/21/2019 2255 01/26/19 0852 01/27/19 0340 01/28/19 0352 01/20/2019 0835 02/03/2019 1225  NA 137 135 134* 132* 140 135  K 5.2* 4.7 3.9 4.8 4.0 4.1  CL 96* 94* 90* 97* 105  --   CO2 27 27 28  20* 20*  --   GLUCOSE 240* 210* 231* 147* 134*  --   BUN 28* 34* 44* 57* 40*  --   CREATININE 1.02* 1.68* 2.73* 2.84* 1.18*  --   CALCIUM 9.9 9.7 9.1 8.4* 7.3*  --    GFR: Estimated Creatinine Clearance: 42.3 mL/min (A) (by C-G formula based on SCr of 1.18 mg/dL (H)). Recent Labs  Lab 01/26/19 0337 01/27/19 0340 01/28/19 0352 01/22/2019 0835  WBC 18.3* 12.3* 16.5* 9.3    Liver Function Tests: Recent Labs  Lab 02/01/2019 2255  AST 16  ALT 15  ALKPHOS 37*  BILITOT 0.7  PROT 7.0  ALBUMIN 3.1*   No results for input(s): LIPASE, AMYLASE in the last 168 hours. No results for input(s):  AMMONIA in the last 168 hours.  ABG    Component Value Date/Time   PHART 7.285 (L) 01/28/2019 1225   PCO2ART 50.0 (H) 02/07/2019 1225   PO2ART 181.0 (H) 01/19/2019 1225   HCO3 23.8 01/20/2019 1225   TCO2 25 01/19/2019 1225   ACIDBASEDEF 3.0 (H) 01/20/2019 1225   O2SAT 99.0 01/23/2019 1225     Coagulation Profile: Recent Labs  Lab 02/14/2019 0835  INR 1.24    Cardiac Enzymes: No results for input(s): CKTOTAL, CKMB, CKMBINDEX, TROPONINI in the last 168 hours.  HbA1C: Hgb A1c MFr Bld  Date/Time Value Ref Range Status  02/02/2019 10:55 PM 6.4 (H) 4.8 - 5.6 % Final    Comment:    (NOTE) Pre diabetes:          5.7%-6.4% Diabetes:              >  6.4% Glycemic control for   <7.0% adults with diabetes   01/21/2019 01:23 PM 6.1 (H) 4.8 - 5.6 % Final    Comment:    (NOTE) Pre diabetes:          5.7%-6.4% Diabetes:              >6.4% Glycemic control for   <7.0% adults with diabetes     CBG: Recent Labs  Lab 01/28/19 1630 01/28/19 2134 02/14/2019 0202 01/28/2019 0533 02/01/2019 1033  GLUCAP 142* 213* 137* 129* 164*    Review of Systems:   Unable, patient is intubated and sedated  Past Medical History  She,  has a past medical history of Arthritis, Asthma, COPD (chronic obstructive pulmonary disease) (HCC), Coronary artery disease, Diabetes mellitus, and Hypertension.   Surgical History    Past Surgical History:  Procedure Laterality Date  . ABDOMINAL HYSTERECTOMY    . BACK SURGERY    . CARDIAC CATHETERIZATION  2012   Mild 2V CAD 08/01/01 [facility]  . CESAREAN SECTION    . CHOLECYSTECTOMY    . FOOT SURGERY    . ORIF ELBOW FRACTURE Right 02/10/2019   Procedure: Right elbow open reduction internal fixation with olecranon osteotomy and ulnar nerve decompression;  Surgeon: Dominica SeverinGramig, William, MD;  Location: [facility] OR;  Service: Orthopedics;  Laterality: Right;  120mins  . TONSILLECTOMY    . TOTAL KNEE ARTHROPLASTY    . TUBAL LIGATION       Social History   reports that she has  quit smoking. Her smoking use included cigarettes. She has a 30.00 pack-year smoking history. She has never used smokeless tobacco. She reports that she does not drink alcohol or use drugs.   Family History   Her family history includes Lung cancer in her father; Lupus in her mother; Pneumonia in her mother.   Allergies Allergies  Allergen Reactions  . Bee Venom Anaphylaxis  . Levaquin [Levofloxacin] Anaphylaxis and Itching  . Amoxicillin-Pot Clavulanate Other (See Comments)    Upset GI Did it involve swelling of the face/tongue/throat, SOB, or low BP? No Did it involve sudden or severe rash/hives, skin peeling, or any reaction on the inside of your mouth or nose? No Did you need to seek medical attention at a hospital or doctor's office? No When did it last happen?10-20 years If all above answers are "NO", may proceed with cephalosporin use.   Marland Kitchen. Morphine And Related Rash  . Rocephin [Ceftriaxone Sodium In Dextrose] Rash and Other (See Comments)  . Latex Itching and Swelling  . Quinine Derivatives     Decreased platelet counts     Home Medications  Prior to Admission medications   Medication Sig Start Date End Date Taking? Authorizing Provider  albuterol (PROVENTIL HFA;VENTOLIN HFA) 108 (90 Base) MCG/ACT inhaler Inhale 2 puffs into the lungs every 6 (six) hours as needed for wheezing or shortness of breath.   Yes [provider]  albuterol (PROVENTIL) (2.5 MG/3ML) 0.083% nebulizer solution Take 2.5 mg by nebulization every 4 (four) hours as needed for wheezing.  09/15/17  Yes [provider]  bisoprolol (ZEBETA) 5 MG tablet Take 1 tablet (5 mg total) by mouth daily. 11/19/18  Yes Lennette BihariKelly, Thomas A, MD  cyclobenzaprine (FLEXERIL) 10 MG tablet Take 10 mg by mouth See admin instructions. Take 10 mg at bedtime, may take an additional 10 mg twice daily as needed for muscle spasms   Yes [provider]  diltiazem (TIAZAC) 120 MG 24 hr capsule Take 120  mg by  mouth at bedtime.   Yes [provider]  diphenhydrAMINE (BENADRYL) 25 MG tablet Take 25 mg by mouth daily as needed for allergies.   Yes [provider]  diphenhydramine-acetaminophen (TYLENOL PM) 25-500 MG TABS Take 2 tablets by mouth at bedtime as needed (sleep).    Yes [provider]  docusate sodium (COLACE) 100 MG capsule Take 100-300 mg by mouth at bedtime.    Yes [provider]  DULoxetine (CYMBALTA) 60 MG capsule Take 60 mg by mouth daily.   Yes [provider]  ergocalciferol (VITAMIN D2) 50000 UNITS capsule Take 50,000 Units by mouth every Saturday.    Yes [provider]  estradiol (ESTRACE) 1 MG tablet Take 1 mg by mouth at bedtime.   Yes [provider]  fenofibrate 160 MG tablet Take 160 mg by mouth at bedtime.   Yes [provider]  Fluticasone-Salmeterol (ADVAIR DISKUS) 250-50 MCG/DOSE AEPB Inhale 2 puffs into the lungs every 12 (twelve) hours.    Yes [provider]  glipiZIDE (GLUCOTROL XL) 10 MG 24 hr tablet Take 10 mg by mouth at bedtime as needed (if blood sugar is over 150).  12/27/17  Yes [provider]  ibuprofen (ADVIL,MOTRIN) 200 MG tablet Take 400-600 mg by mouth 3 (three) times daily as needed for moderate pain. For pain    Yes [provider]  Icosapent Ethyl (VASCEPA) 1 g CAPS Take 2 capsules (2 g total) by mouth 2 (two) times daily. 11/19/18  Yes Lennette Bihari, MD  losartan (COZAAR) 100 MG tablet Take 100 mg by mouth at bedtime.  08/15/17  Yes [provider]  metFORMIN (GLUCOPHAGE) 1000 MG tablet Take 1,000 mg by mouth 2 (two) times daily with a meal.   Yes [provider]  omeprazole (PRILOSEC) 20 MG capsule Take 20 mg by mouth 2 (two) times daily.   Yes [provider]  oxymetazoline (AFRIN) 0.05 % nasal spray Place 1 spray into both nostrils at bedtime as needed for congestion.   Yes [provider]  sitaGLIPtin (JANUVIA) 100 MG  tablet Take 100 mg by mouth daily.   Yes [provider]  tiotropium (SPIRIVA HANDIHALER) 18 MCG inhalation capsule Place 18 mcg into inhaler and inhale daily.  08/17/17  Yes [provider]  torsemide (DEMADEX) 20 MG tablet Take 0.5 tablets (10 mg total) by mouth daily. 12/11/18 03/11/19 Yes Lennette Bihari, MD  triamterene-hydrochlorothiazide (MAXZIDE-25) 37.5-25 MG per tablet Take 1 tablet by mouth every morning.   Yes [provider]     Critical care APP time: 18 minutes      Bevelyn Ngo, AGACNP-BC [facility] Pulmonary/Critical Care Medicine Pager # [phone] After 4 pm call [phone] 01/28/2019 1:00 PM  Attending Note:  73 year old female with PMH above presenting to PCCM with respiratory failure post IR intervention for MCA CVA.  Patient was left intubated due interventions, COPD and OHV.  On exam, lungs with distant BS diffusely.  I reviewed CXR myself, ETT is in a good position.  Discussed with PCCM-NP.  Will maintain intubated.  Adjust vent for ABG.  BD as ordered.  ISS.  Sedation ordered.  Will attempt a wean in AM after patient can sit up.  PCCM will continue to follow.  The patient is critically ill with multiple organ systems failure and requires high complexity decision making for assessment and support, frequent evaluation and titration of therapies, application of advanced monitoring technologies and extensive interpretation  of multiple databases.   Critical Care Time devoted to patient care services described in this note is  34  Minutes. This time reflects time of care of this signee Dr Jennet Maduro. This critical care time does not reflect procedure time, or teaching time or supervisory time of PA/NP/Med student/Med Resident etc but could involve care discussion time.  Rush Farmer, M.D. [facility] Pulmonary/Critical Care Medicine. Pager: [phone]. After hours pager: [phone].

## 2019-01-30 ENCOUNTER — Inpatient Hospital Stay (HOSPITAL_COMMUNITY): Payer: Medicare Other

## 2019-01-30 ENCOUNTER — Encounter (HOSPITAL_COMMUNITY): Payer: Self-pay | Admitting: Interventional Radiology

## 2019-01-30 DIAGNOSIS — I6601 Occlusion and stenosis of right middle cerebral artery: Secondary | ICD-10-CM

## 2019-01-30 LAB — POCT I-STAT 7, (LYTES, BLD GAS, ICA,H+H)
Acid-base deficit: 5 mmol/L — ABNORMAL HIGH (ref 0.0–2.0)
BICARBONATE: 21.6 mmol/L (ref 20.0–28.0)
Calcium, Ion: 1.18 mmol/L (ref 1.15–1.40)
HEMATOCRIT: 30 % — AB (ref 36.0–46.0)
HEMOGLOBIN: 10.2 g/dL — AB (ref 12.0–15.0)
O2 Saturation: 95 %
Patient temperature: 98.7
Potassium: 3.8 mmol/L (ref 3.5–5.1)
Sodium: 138 mmol/L (ref 135–145)
TCO2: 23 mmol/L (ref 22–32)
pCO2 arterial: 43.4 mmHg (ref 32.0–48.0)
pH, Arterial: 7.305 — ABNORMAL LOW (ref 7.350–7.450)
pO2, Arterial: 81 mmHg — ABNORMAL LOW (ref 83.0–108.0)

## 2019-01-30 LAB — BASIC METABOLIC PANEL
Anion gap: 8 (ref 5–15)
BUN: 34 mg/dL — ABNORMAL HIGH (ref 8–23)
CO2: 23 mmol/L (ref 22–32)
Calcium: 8.2 mg/dL — ABNORMAL LOW (ref 8.9–10.3)
Chloride: 107 mmol/L (ref 98–111)
Creatinine, Ser: 1.23 mg/dL — ABNORMAL HIGH (ref 0.44–1.00)
GFR calc Af Amer: 50 mL/min — ABNORMAL LOW (ref >60)
GFR calc non Af Amer: 43 mL/min — ABNORMAL LOW (ref >60)
Glucose, Bld: 158 mg/dL — ABNORMAL HIGH (ref 70–99)
Potassium: 3.8 mmol/L (ref 3.5–5.1)
Sodium: 138 mmol/L (ref 135–145)

## 2019-01-30 LAB — MAGNESIUM: Magnesium: 2.1 mg/dL (ref 1.7–2.4)

## 2019-01-30 LAB — CBC WITH DIFFERENTIAL/PLATELET
Abs Immature Granulocytes: 0.05 10*3/uL (ref 0.00–0.07)
Basophils Absolute: 0 10*3/uL (ref 0.0–0.1)
Basophils Relative: 0 %
Eosinophils Absolute: 0 10*3/uL (ref 0.0–0.5)
Eosinophils Relative: 0 %
HCT: 25.4 % — ABNORMAL LOW (ref 36.0–46.0)
Hemoglobin: 7.9 g/dL — ABNORMAL LOW (ref 12.0–15.0)
Immature Granulocytes: 1 %
Lymphocytes Relative: 8 %
Lymphs Abs: 0.7 10*3/uL (ref 0.7–4.0)
MCH: 30.4 pg (ref 26.0–34.0)
MCHC: 31.1 g/dL (ref 30.0–36.0)
MCV: 97.7 fL (ref 80.0–100.0)
Monocytes Absolute: 0.7 10*3/uL (ref 0.1–1.0)
Monocytes Relative: 8 %
Neutro Abs: 7.5 10*3/uL (ref 1.7–7.7)
Neutrophils Relative %: 83 %
Platelets: 443 10*3/uL — ABNORMAL HIGH (ref 150–400)
RBC: 2.6 MIL/uL — ABNORMAL LOW (ref 3.87–5.11)
RDW: 15.7 % — ABNORMAL HIGH (ref 11.5–15.5)
WBC: 9.1 10*3/uL (ref 4.0–10.5)
nRBC: 0 % (ref 0.0–0.2)

## 2019-01-30 LAB — ECHOCARDIOGRAM COMPLETE
Height: 61 in
Weight: 3033.53 oz

## 2019-01-30 LAB — LIPID PANEL
Cholesterol: 108 mg/dL (ref 0–200)
HDL: 23 mg/dL — ABNORMAL LOW (ref >40)
LDL Cholesterol: 32 mg/dL (ref 0–99)
TRIGLYCERIDES: 264 mg/dL — AB (ref <150)
Total CHOL/HDL Ratio: 4.7 RATIO
VLDL: 53 mg/dL — ABNORMAL HIGH (ref 0–40)

## 2019-01-30 LAB — TYPE AND SCREEN
ABO/RH(D): O POS
Antibody Screen: NEGATIVE
Unit division: 0

## 2019-01-30 LAB — GLUCOSE, CAPILLARY
GLUCOSE-CAPILLARY: 145 mg/dL — AB (ref 70–99)
Glucose-Capillary: 110 mg/dL — ABNORMAL HIGH (ref 70–99)
Glucose-Capillary: 112 mg/dL — ABNORMAL HIGH (ref 70–99)
Glucose-Capillary: 128 mg/dL — ABNORMAL HIGH (ref 70–99)
Glucose-Capillary: 133 mg/dL — ABNORMAL HIGH (ref 70–99)
Glucose-Capillary: 138 mg/dL — ABNORMAL HIGH (ref 70–99)
Glucose-Capillary: 146 mg/dL — ABNORMAL HIGH (ref 70–99)
Glucose-Capillary: 174 mg/dL — ABNORMAL HIGH (ref 70–99)

## 2019-01-30 LAB — BPAM RBC
Blood Product Expiration Date: 202003102359
ISSUE DATE / TIME: 202002111238
Unit Type and Rh: 5100

## 2019-01-30 LAB — PHOSPHORUS: Phosphorus: 3.5 mg/dL (ref 2.5–4.6)

## 2019-01-30 LAB — HEMOGLOBIN A1C
Hgb A1c MFr Bld: 6.1 % — ABNORMAL HIGH (ref 4.8–5.6)
Mean Plasma Glucose: 128.37 mg/dL

## 2019-01-30 LAB — LACTIC ACID, PLASMA: Lactic Acid, Venous: 0.6 mmol/L (ref 0.5–1.9)

## 2019-01-30 MED ORDER — CLOPIDOGREL BISULFATE 75 MG PO TABS
75.0000 mg | ORAL_TABLET | Freq: Every day | ORAL | Status: DC
  Administered 2019-01-30 – 2019-02-06 (×7): 75 mg
  Filled 2019-01-30 (×7): qty 1

## 2019-01-30 MED ORDER — DEXMEDETOMIDINE HCL IN NACL 200 MCG/50ML IV SOLN
0.4000 ug/kg/h | INTRAVENOUS | Status: DC
  Administered 2019-01-30: 0.4 ug/kg/h via INTRAVENOUS
  Administered 2019-01-30: 0.6 ug/kg/h via INTRAVENOUS
  Administered 2019-01-31 (×2): 0.7 ug/kg/h via INTRAVENOUS
  Filled 2019-01-30 (×4): qty 50

## 2019-01-30 MED ORDER — ASPIRIN 81 MG PO CHEW
81.0000 mg | CHEWABLE_TABLET | Freq: Every day | ORAL | Status: DC
  Administered 2019-01-30 – 2019-01-31 (×2): 81 mg
  Filled 2019-01-30 (×2): qty 1

## 2019-01-30 MED ORDER — ENOXAPARIN SODIUM 40 MG/0.4ML ~~LOC~~ SOLN
40.0000 mg | SUBCUTANEOUS | Status: DC
  Administered 2019-01-30 – 2019-02-06 (×8): 40 mg via SUBCUTANEOUS
  Filled 2019-01-30 (×8): qty 0.4

## 2019-01-30 MED ORDER — VITAL HIGH PROTEIN PO LIQD
1000.0000 mL | ORAL | Status: DC
  Administered 2019-01-30: 1000 mL

## 2019-01-30 NOTE — Progress Notes (Signed)
Patient's SP02 in the 70s on monitor with good wave form. Upon assessment patient's vent tubing had become disconnected and she had a large amount of oral secretions. Patient was flipped back to full support and placed on 100% Fi02. Patient was suctioned and came up to 100%. RT was notified. Patient's secretions via inline suction looked like tube feed. Tube feed was stopped and patients OG was hooked up to LIS. Will continue to monitor patient.

## 2019-01-30 NOTE — Progress Notes (Addendum)
STROKE TEAM PROGRESS NOTE   INTERVAL HISTORY Her daughter is at the bedside.  Her daughter is an MRI tech.  Patient recently retired.  Moderate size right MCA infarct with M2 occlusion status post attempted IR.  Remains intubated this morning.   Vitals:   01/30/19 0738 01/30/19 0739 01/30/19 0747 01/30/19 0800  BP:      Pulse:      Resp:      Temp:    98.4 F (36.9 C)  TempSrc:    Axillary  SpO2: 100% 100% 99%   Weight:      Height:        CBC:  Recent Labs  Lab 02/10/2019 1230  02/10/2019 2200 01/30/19 0404 01/30/19 0439  WBC 12.1*   < > 10.0 9.1  --   NEUTROABS 9.8*  --   --  7.5  --   HGB 7.0*   < > 7.9* 7.9* 10.2*  HCT 22.9*   < > 25.4* 25.4* 30.0*  MCV 99.1   < > 96.2 97.7  --   PLT 469*   < > 405* 443*  --    < > = values in this interval not displayed.    Basic Metabolic Panel:  Recent Labs  Lab 02/13/2019 1230  01/24/2019 1443  01/30/19 0404 01/30/19 0439  NA 135   < >  --    < > 138 138  K 4.6   < >  --    < > 3.8 3.8  CL 102  --   --   --  107  --   CO2 22  --   --   --  23  --   GLUCOSE 162*  --   --   --  158*  --   BUN 42*  --   --   --  34*  --   CREATININE 1.33*  --   --   --  1.23*  --   CALCIUM 8.1*  --   --   --  8.2*  --   MG  --   --  2.0  --  2.1  --   PHOS  --   --   --   --  3.5  --    < > = values in this interval not displayed.   Lipid Panel:     Component Value Date/Time   CHOL 108 01/30/2019 0404   TRIG 264 (H) 01/30/2019 0404   HDL 23 (L) 01/30/2019 0404   CHOLHDL 4.7 01/30/2019 0404   VLDL 53 (H) 01/30/2019 0404   LDLCALC 32 01/30/2019 0404   HgbA1c:  Lab Results  Component Value Date   HGBA1C 6.1 (H) 01/30/2019   Urine Drug Screen: No results found for: LABOPIA, COCAINSCRNUR, LABBENZ, AMPHETMU, THCU, LABBARB  Alcohol Level No results found for: ETH  IMAGING Ct Angio Head W Or Wo Contrast  Result Date: 02/13/2019 CLINICAL DATA:  Left facial droop, left-sided weakness, and slurred speech. EXAM: CT ANGIOGRAPHY HEAD AND NECK  CT PERFUSION BRAIN TECHNIQUE: Multidetector CT imaging of the head and neck was performed using the standard protocol during bolus administration of intravenous contrast. Multiplanar CT image reconstructions and MIPs were obtained to evaluate the vascular anatomy. Carotid stenosis measurements (when applicable) are obtained utilizing NASCET criteria, using the distal internal carotid diameter as the denominator. Multiphase CT imaging of the brain was performed following IV bolus contrast injection. Subsequent parametric perfusion maps were calculated using RAPID software. CONTRAST:  100mL  ISOVUE-370 IOPAMIDOL (ISOVUE-370) INJECTION 76% COMPARISON:  None. FINDINGS: CTA NECK FINDINGS Aortic arch: Normal variant aortic arch branching pattern with common origin of the brachiocephalic and left common carotid arteries. Moderate calcified plaque in the aortic arch. No significant arch vessel origin stenosis. Mild right subclavian artery origin stenosis due to calcified plaque. Predominantly calcified plaque in the proximal left subclavian artery without significant stenosis. Right carotid system: Patent with calcified plaque about the carotid bifurcation. No evidence of significant stenosis or dissection. Retropharyngeal course of the distal common and proximal internal carotid arteries. Left carotid system: Patent with predominantly calcified plaque about the carotid bifurcation. No evidence of significant stenosis or dissection. Retropharyngeal course of the distal common and proximal external carotid arteries. Vertebral arteries: Patent with the left being moderately dominant. Moderate right vertebral artery origin stenosis. Skeleton: Advanced disc degeneration at C4-5 and C5-6 as well as in the upper thoracic spine. Other neck: Suspected 1.6 cm left lower pole thyroid nodule, poorly evaluated due to beam hardening. Upper chest: No apical lung consolidation or mass. Review of the MIP images confirms the above findings  CTA HEAD FINDINGS Anterior circulation: The internal carotid arteries are patent from skull base to carotid termini with mild cavernous and proximal supraclinoid stenosis bilaterally. The right MCA is patent without significant M1 stenosis. There is a severe stenosis of the proximal right M2 middle division near the MCA trifurcation. A mild left M1 stenosis is noted. The ACAs are patent with mild branch vessel irregularity but no significant proximal stenosis. No aneurysm is identified. Posterior circulation: The intracranial vertebral arteries are patent to the basilar with the left being dominant. Atherosclerotic plaque results in mild to moderate left V4 stenosis. PICAs, AICAs, and SCAs are grossly patent bilaterally. The basilar artery is patent without evidence of significant stenosis. There is a large right posterior communicating artery with hypoplastic right P1 segment. There is a severe proximal left P1 stenosis. No aneurysm is identified. Venous sinuses: Patent. Anatomic variants: Predominantly fetal origin of the right PCA. Review of the MIP images confirms the above findings CT Brain Perfusion Findings: CBF (<30%) Volume: 0 mL Perfusion (Tmax>6.0s) volume: 10mL in the right parietal lobe Mismatch Volume: 10 mL Infarction Location: No core infarct by RAPID CBF criteria. IMPRESSION: 1. Negative for large vessel occlusion but positive for severe proximal right M2 stenosis. 2. Right parietal lobe ischemic penumbra without core infarct evident on CTP. 3. Mild bilateral intracranial ICA stenoses. 4. No cervical carotid artery stenosis. 5. Moderate right vertebral artery origin stenosis. 6.  Aortic Atherosclerosis (ICD10-I70.0). Preliminary CTA results were communicated to Dr. Wilford Corner at 7:39 am on 01/19/2019 by text page via the [facility] messaging system. Electronically Signed   By: Sebastian Ache M.D.   On: 01/19/2019 08:32   Dg Chest 1 View  Result Date: 01/28/2019 CLINICAL DATA:  Leukocytosis. EXAM: CHEST  1  VIEW COMPARISON:  03/20/2012 FINDINGS: Patient is slightly rotated to the left. Lungs are hypoinflated with minimal linear density over the left base likely atelectasis. No definite effusion. Possible mild cardiomegaly. Mild degenerate change of the spine. IMPRESSION: Hypoinflation with linear density left base likely atelectasis. Electronically Signed   By: Elberta Fortis M.D.   On: 01/28/2019 14:23   Ct Angio Neck W Or Wo Contrast  Result Date: 01/28/2019 CLINICAL DATA:  Left facial droop, left-sided weakness, and slurred speech. EXAM: CT ANGIOGRAPHY HEAD AND NECK CT PERFUSION BRAIN TECHNIQUE: Multidetector CT imaging of the head and neck was performed using the standard protocol during  bolus administration of intravenous contrast. Multiplanar CT image reconstructions and MIPs were obtained to evaluate the vascular anatomy. Carotid stenosis measurements (when applicable) are obtained utilizing NASCET criteria, using the distal internal carotid diameter as the denominator. Multiphase CT imaging of the brain was performed following IV bolus contrast injection. Subsequent parametric perfusion maps were calculated using RAPID software. CONTRAST:  ISOVUE-370 IOPAMIDOL (ISOVUE-370) INJECTION 76% COMPARISON:  None. FINDINGS: CTA NECK FINDINGS Aortic arch: Normal variant aortic arch branching pattern with common origin of the brachiocephalic and left common carotid arteries. Moderate calcified plaque in the aortic arch. No significant arch vessel origin stenosis. Mild right subclavian artery origin stenosis due to calcified plaque. Predominantly calcified plaque in the proximal left subclavian artery without significant stenosis. Right carotid system: Patent with calcified plaque about the carotid bifurcation. No evidence of significant stenosis or dissection. Retropharyngeal course of the distal common and proximal internal carotid arteries. Left carotid system: Patent with predominantly calcified plaque about  the carotid bifurcation. No evidence of significant stenosis or dissection. Retropharyngeal course of the distal common and proximal external carotid arteries. Vertebral arteries: Patent with the left being moderately dominant. Moderate right vertebral artery origin stenosis. Skeleton: Advanced disc degeneration at C4-5 and C5-6 as well as in the upper thoracic spine. Other neck: Suspected 1.6 cm left lower pole thyroid nodule, poorly evaluated due to beam hardening. Upper chest: No apical lung consolidation or mass. Review of the MIP images confirms the above findings CTA HEAD FINDINGS Anterior circulation: The internal carotid arteries are patent from skull base to carotid termini with mild cavernous and proximal supraclinoid stenosis bilaterally. The right MCA is patent without significant M1 stenosis. There is a severe stenosis of the proximal right M2 middle division near the MCA trifurcation. A mild left M1 stenosis is noted. The ACAs are patent with mild branch vessel irregularity but no significant proximal stenosis. No aneurysm is identified. Posterior circulation: The intracranial vertebral arteries are patent to the basilar with the left being dominant. Atherosclerotic plaque results in mild to moderate left V4 stenosis. PICAs, AICAs, and SCAs are grossly patent bilaterally. The basilar artery is patent without evidence of significant stenosis. There is a large right posterior communicating artery with hypoplastic right P1 segment. There is a severe proximal left P1 stenosis. No aneurysm is identified. Venous sinuses: Patent. Anatomic variants: Predominantly fetal origin of the right PCA. Review of the MIP images confirms the above findings CT Brain Perfusion Findings: CBF (<30%) Volume: 0 mL Perfusion (Tmax>6.0s) volume: 43mL in the right parietal lobe Mismatch Volume: 10 mL Infarction Location: No core infarct by RAPID CBF criteria. IMPRESSION: 1. Negative for large vessel occlusion but positive for  severe proximal right M2 stenosis. 2. Right parietal lobe ischemic penumbra without core infarct evident on CTP. 3. Mild bilateral intracranial ICA stenoses. 4. No cervical carotid artery stenosis. 5. Moderate right vertebral artery origin stenosis. 6.  Aortic Atherosclerosis (ICD10-I70.0). Preliminary CTA results were communicated to Dr. Wilford Corner at 7:39 am on 02/12/2019 by text page via the [facility] messaging system. Electronically Signed   By: Sebastian Ache M.D.   On: 01/20/2019 08:32   Mr Brain Wo Contrast  Result Date: 01/30/2019 CLINICAL DATA:  Stroke follow-up EXAM: MRI HEAD WITHOUT CONTRAST TECHNIQUE: Multiplanar, multiecho pulse sequences of the brain and surrounding structures were obtained without intravenous contrast. COMPARISON:  CTA head neck 01/27/2019 FINDINGS: BRAIN: Large infarct of the right posterior MCA territory. There is moderate associated edema. No midline shift or other mass effect. The  midline structures are normal.The white matter signal is normal for the patient's age. The cerebral and cerebellar volume are age-appropriate. Susceptibility-sensitive sequences show no chronic microhemorrhage or superficial siderosis. VASCULAR: Major intracranial arterial and venous sinus flow voids are normal. SKULL AND UPPER CERVICAL SPINE: Calvarial bone marrow signal is normal. There is no skull base mass. Visualized upper cervical spine and soft tissues are normal. SINUSES/ORBITS: No fluid levels or advanced mucosal thickening. No mastoid or middle ear effusion. The orbits are normal. 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 No acute hemorrhage. Electronically Signed   By: Deatra Robinson M.D.   On: 01/30/2019 02:43   Ct Cerebral Perfusion W Contrast  Result Date: 02/15/2019 CLINICAL DATA:  Left facial droop, left-sided weakness, and slurred speech. EXAM: CT ANGIOGRAPHY HEAD AND NECK CT PERFUSION BRAIN TECHNIQUE: Multidetector CT  imaging of the head and neck was performed using the standard protocol during bolus administration of intravenous contrast. Multiplanar CT image reconstructions and MIPs were obtained to evaluate the vascular anatomy. Carotid stenosis measurements (when applicable) are obtained utilizing NASCET criteria, using the distal internal carotid diameter as the denominator. Multiphase CT imaging of the brain was performed following IV bolus contrast injection. Subsequent parametric perfusion maps were calculated using RAPID software. CONTRAST:  ISOVUE-370 IOPAMIDOL (ISOVUE-370) INJECTION 76% COMPARISON:  None. FINDINGS: CTA NECK FINDINGS Aortic arch: Normal variant aortic arch branching pattern with common origin of the brachiocephalic and left common carotid arteries. Moderate calcified plaque in the aortic arch. No significant arch vessel origin stenosis. Mild right subclavian artery origin stenosis due to calcified plaque. Predominantly calcified plaque in the proximal left subclavian artery without significant stenosis. Right carotid system: Patent with calcified plaque about the carotid bifurcation. No evidence of significant stenosis or dissection. Retropharyngeal course of the distal common and proximal internal carotid arteries. Left carotid system: Patent with predominantly calcified plaque about the carotid bifurcation. No evidence of significant stenosis or dissection. Retropharyngeal course of the distal common and proximal external carotid arteries. Vertebral arteries: Patent with the left being moderately dominant. Moderate right vertebral artery origin stenosis. Skeleton: Advanced disc degeneration at C4-5 and C5-6 as well as in the upper thoracic spine. Other neck: Suspected 1.6 cm left lower pole thyroid nodule, poorly evaluated due to beam hardening. Upper chest: No apical lung consolidation or mass. Review of the MIP images confirms the above findings CTA HEAD FINDINGS Anterior circulation: The  internal carotid arteries are patent from skull base to carotid termini with mild cavernous and proximal supraclinoid stenosis bilaterally. The right MCA is patent without significant M1 stenosis. There is a severe stenosis of the proximal right M2 middle division near the MCA trifurcation. A mild left M1 stenosis is noted. The ACAs are patent with mild branch vessel irregularity but no significant proximal stenosis. No aneurysm is identified. Posterior circulation: The intracranial vertebral arteries are patent to the basilar with the left being dominant. Atherosclerotic plaque results in mild to moderate left V4 stenosis. PICAs, AICAs, and SCAs are grossly patent bilaterally. The basilar artery is patent without evidence of significant stenosis. There is a large right posterior communicating artery with hypoplastic right P1 segment. There is a severe proximal left P1 stenosis. No aneurysm is identified. Venous sinuses: Patent. Anatomic variants: Predominantly fetal origin of the right PCA. Review of the MIP images confirms the above findings CT Brain Perfusion Findings: CBF (<30%) Volume: 0 mL Perfusion (Tmax>6.0s) volume: 10mL in the right parietal  lobe Mismatch Volume: 10 mL Infarction Location: No core infarct by RAPID CBF criteria. IMPRESSION: 1. Negative for large vessel occlusion but positive for severe proximal right M2 stenosis. 2. Right parietal lobe ischemic penumbra without core infarct evident on CTP. 3. Mild bilateral intracranial ICA stenoses. 4. No cervical carotid artery stenosis. 5. Moderate right vertebral artery origin stenosis. 6.  Aortic Atherosclerosis (ICD10-I70.0). Preliminary CTA results were communicated to Dr. Wilford Corner at 7:39 am on 2019/02/22 by text page via the [facility] messaging system. Electronically Signed   By: Sebastian Ache M.D.   On: 2019-02-22 08:32   Dg Chest Port 1 View  Result Date: 01/30/2019 CLINICAL DATA:  History of endotracheal tube EXAM: PORTABLE CHEST 1 VIEW COMPARISON:   Two days ago FINDINGS: Endotracheal tube tip at the clavicular heads. The orogastric tube at least reaches the stomach. Low volume chest with hazy opacities at the bases. Probable pleural fluid on the left. IMPRESSION: 1. Unremarkable hardware positioning. 2. Atelectasis or infection at the bases with probable small left effusion. Electronically Signed   By: Marnee Spring M.D.   On: 01/30/2019 08:49   Dg Abd Portable 1v  Result Date: 2019/02/22 CLINICAL DATA:  OG tube placement EXAM: PORTABLE ABDOMEN - 1 VIEW COMPARISON:  None. FINDINGS: OG tube tip is in the mid stomach. Nonobstructed bowel gas pattern. Left lower lobe atelectasis or infiltrate. IMPRESSION: OG tube tip in the mid stomach. Electronically Signed   By: Charlett Nose M.D.   On: 02-22-2019 22:29   Ct Head Code Stroke Wo Contrast  Result Date: 2019/02/22 CLINICAL DATA:  Code stroke. Left facial droop, left-sided weakness, and slurred speech. EXAM: CT HEAD WITHOUT CONTRAST TECHNIQUE: Contiguous axial images were obtained from the base of the skull through the vertex without intravenous contrast. COMPARISON:  None. FINDINGS: Brain: There is no evidence of acute infarct, intracranial hemorrhage, mass, midline shift, or extra-axial fluid collection. Mild cerebral atrophy is within normal limits for age. Cerebral white matter hypodensities are nonspecific but compatible with mild chronic small vessel ischemic disease. Vascular: Calcified atherosclerosis at the skull base. No hyperdense vessel. Skull: No fracture or suspicious osseous lesion. Sinuses/Orbits: Left mastoid effusion. Clear paranasal sinuses. Bilateral cataract extraction. Other: None. ASPECTS [facility] Stroke Program Early CT Score) - Ganglionic level infarction (caudate, lentiform nuclei, internal capsule, insula, M1-M3 cortex): 7 - Supraganglionic infarction (M4-M6 cortex): 3 Total score (0-10 with 10 being normal): 10 IMPRESSION: 1. No evidence of acute intracranial abnormality. 2.  ASPECTS is 10. These results were communicated to Dr. Wilford Corner at 7:39 am on 22-Feb-2019 by text page via the [facility] messaging system. Electronically Signed   By: Sebastian Ache M.D.   On: 02/22/19 07:44    PHYSICAL EXAM General: Awake alert in no distress HEENT: Normocephalic atraumatic dry mucous membranes Lungs: Clear to auscultation Cardiovascular: S1-S2 heard regular rate rhythm   Patient awake Intubated, not following commands Pupils are 3mm and equal and reactive  extraocular movement exam shows inability to gaze to the left.  She cannot cross midline to look to the left.  No visual field cuts based on confrontation.  Complete left lower facial paralysis.  Palate midline.  Tongue midline. Motor exam: Right upper extremity in cast and difficult examined.  2/5 left upper extremity.  3/5 left lower extremity.  5/5 right lower extremity. Sensory exam: grimaces to pain throughout . Coordination: Unable to perform   ASSESSMENT/PLAN [name] is a 73 y.o. 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 In hospital developed confusion, left-sided facial droop, left arm and leg weakness and numbness.  She was not a TPA candidate secondary to recent surgery.  She was taken to IR, with unsuccessful revascularization.  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 ASPECTS 10.     CTA head & neck no LVO.  Severe R M2 stenosis.  Mild bilateral ICA stenosis.  Moderate RVA stenosis.  Aortic atherosclerosis.  CT perfusion R parietal ischemic penumbra without core infarct  MRI  Large acute R MCA infarct w/ mod  edema, no shift  2D Echo  pending   LDL 32 on Lovaza  HgbA1c 6.1  no VTE prophylaxis.  Lovenox 40 mg subcu daily added Diet Order            Diet NPO time specified  Diet effective now               No antithrombotic prior to admission, now on No antithrombotic. Given large vessel intracranial atherosclerosis, patient should be treated with aspirin 81 mg and clopidogrel 75 mg orally every day x 3 months for secondary stroke prevention. After 3 months, change to plavix alone. Long-term dual antiplatelets are contraindicated due to risk for intracerebral hemorrhage.   Therapy recommendations:  pending   Disposition:  pending   Acute respiratory failure  COPD  Intubated for attempted IR   Anticipate wean today   CXR atx w/ prob small L effusion  PCCM following  Hypertension  PTA: diltiazem 120 q 24h  Now on cardizem 30 q4  BP goal per IR x 24h - 120-140 . 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 Renal US no hydro. Cr 2.56  COPD  Non union supracondylar humerus fracture, R  Hospital day # 5  Annie Main, MSN, APRN, ANVP-BC, AGPCNP-BC Advanced Practice Stroke Nurse [facility] See Amion for Schedule & Pager information 01/30/2019 11:05 AM   R M2 stenosis, result in moderate size R MCA stroke. Patient is left handed Will do dual antiplatelet therapy based on sampris trial Continue CPAP trials as per ICU team Monitor in ICU for edema watch due to size of infract Start DVT ppx with lovenox  To contact Stroke Continuity provider, please refer to [email or web address]. After hours, contact General Neurology

## 2019-01-30 NOTE — Progress Notes (Signed)
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 Corliss Skains. Patient laying in bed intubated with sedation. Can spontaneously move right side, no movements of LUE, LLE withdraws from pain. Right groin incision c/d/i.  MR brain 01/30/2019: 1. 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 No acute hemorrhage.   Allergies: Bee venom; Levaquin [levofloxacin]; Amoxicillin-pot clavulanate; Morphine and related; Rocephin [ceftriaxone sodium in dextrose]; Latex; and Quinine derivatives  Medications: Prior to Admission medications   Medication Sig Start Date End Date Taking? Authorizing Provider  albuterol (PROVENTIL HFA;VENTOLIN HFA) 108 (90 Base) MCG/ACT inhaler Inhale 2 puffs into the lungs every 6 (six) hours as needed for wheezing or shortness of breath.   Yes [provider]  albuterol (PROVENTIL) (2.5 MG/3ML) 0.083% nebulizer solution Take 2.5 mg by nebulization every 4 (four) hours as needed for wheezing.  09/15/17  Yes [provider]  bisoprolol (ZEBETA) 5 MG tablet Take 1 tablet (5 mg total) by mouth daily. 11/19/18  Yes Lennette Bihari, MD  cyclobenzaprine (FLEXERIL) 10 MG tablet Take 10 mg by mouth See admin instructions. Take 10 mg at bedtime, may take an additional 10 mg twice daily as needed for muscle spasms   Yes [provider]  diltiazem (TIAZAC) 120 MG 24 hr capsule Take 120 mg by mouth at bedtime.   Yes [provider]  diphenhydrAMINE (BENADRYL) 25 MG tablet Take 25 mg by mouth daily as needed for allergies.   Yes [provider]  diphenhydramine-acetaminophen (TYLENOL PM) 25-500 MG TABS Take 2 tablets by mouth at  bedtime as needed (sleep).    Yes [provider]  docusate sodium (COLACE) 100 MG capsule Take 100-300 mg by mouth at bedtime.    Yes [provider]  DULoxetine (CYMBALTA) 60 MG capsule Take 60 mg by mouth daily.   Yes [provider]  ergocalciferol (VITAMIN D2) 50000 UNITS capsule Take 50,000 Units by mouth every Saturday.    Yes [provider]  estradiol (ESTRACE) 1 MG tablet Take 1 mg by mouth at bedtime.   Yes [provider]  fenofibrate 160 MG tablet Take 160 mg by mouth at bedtime.   Yes [provider]  Fluticasone-Salmeterol (ADVAIR DISKUS) 250-50 MCG/DOSE AEPB Inhale 2 puffs into the lungs every 12 (twelve) hours.    Yes [provider]  glipiZIDE (GLUCOTROL XL) 10 MG 24 hr tablet Take 10 mg by mouth at bedtime as needed (if blood sugar is over 150).  12/27/17  Yes [provider]  ibuprofen (ADVIL,MOTRIN) 200 MG tablet Take 400-600 mg by mouth 3 (three) times daily as needed for moderate pain. For pain    Yes [provider]  Icosapent Ethyl (VASCEPA) 1 g CAPS Take 2 capsules (2 g total) by mouth 2 (two) times daily. 11/19/18  Yes Lennette Bihari, MD  losartan (COZAAR) 100 MG tablet Take 100 mg by mouth at bedtime.  08/15/17  Yes [provider]  metFORMIN (GLUCOPHAGE) 1000 MG tablet Take 1,000 mg by mouth 2 (two) times daily with a meal.   Yes [provider]  omeprazole (PRILOSEC) 20 MG capsule Take 20 mg by mouth 2 (two) times daily.   Yes [provider]  oxymetazoline (AFRIN) 0.05 %  nasal spray Place 1 spray into both nostrils at bedtime as needed for congestion.   Yes [provider]  sitaGLIPtin (JANUVIA) 100 MG tablet Take 100 mg by mouth daily.   Yes [provider]  tiotropium (SPIRIVA HANDIHALER) 18 MCG inhalation capsule Place 18 mcg into inhaler and inhale daily.  08/17/17  Yes [provider]  torsemide (DEMADEX) 20 MG tablet Take 0.5 tablets  (10 mg total) by mouth daily. 12/11/18 03/11/19 Yes Lennette Bihari, MD  triamterene-hydrochlorothiazide (MAXZIDE-25) 37.5-25 MG per tablet Take 1 tablet by mouth every morning.   Yes [provider]     Vital Signs: BP (!) 126/49   Pulse 70   Temp 98.4 F (36.9 C) (Axillary)   Resp (!) 24   Ht 5\' 1"  (1.549 m)   Wt 189 lb 9.5 oz (86 kg)   SpO2 99%   BMI 35.82 kg/m   Physical Exam Vitals signs and nursing note reviewed.  Constitutional:      General: She is not in acute distress.    Appearance: Normal appearance.     Comments: Intubated and sedated.  Pulmonary:     Effort: Pulmonary effort is normal. No respiratory distress.     Comments: Intubated and sedated. Skin:    General: Skin is warm and dry.     Comments: Right groin incision soft without active bleeding or hematoma.  Neurological:     Mental Status: She is alert.     Comments: Intubated and sedated. Speech and comprehension not assessed due to sedation/ET tube. PERRL bilaterally. EOMs not assessed. Visual fields not assessed. Unable to assess facial asymmetry due to ET tube. Unable to assess tongue protrusion due to ET tube. Can spontaneously move right side, no movements of LUE, LLE withdraws from pain. Pronator drift not assessed. Fine motor and coordination not assessed. Gait not assessed. Romberg not assessed. Heel to toe not assessed. Distal pulses 1+ bilaterally.  Psychiatric:     Comments: Intubated and sedated.     Imaging: Ct Angio Head W Or Wo Contrast  Result Date: 01/23/2019 CLINICAL DATA:  Left facial droop, left-sided weakness, and slurred speech. EXAM: CT ANGIOGRAPHY HEAD AND NECK CT PERFUSION BRAIN TECHNIQUE: Multidetector CT imaging of the head and neck was performed using the standard protocol during bolus administration of intravenous contrast. Multiplanar CT image reconstructions and MIPs were obtained to evaluate the vascular anatomy. Carotid stenosis measurements (when  applicable) are obtained utilizing NASCET criteria, using the distal internal carotid diameter as the denominator. Multiphase CT imaging of the brain was performed following IV bolus contrast injection. Subsequent parametric perfusion maps were calculated using RAPID software. CONTRAST:  ISOVUE-370 IOPAMIDOL (ISOVUE-370) INJECTION 76% COMPARISON:  None. FINDINGS: CTA NECK FINDINGS Aortic arch: Normal variant aortic arch branching pattern with common origin of the brachiocephalic and left common carotid arteries. Moderate calcified plaque in the aortic arch. No significant arch vessel origin stenosis. Mild right subclavian artery origin stenosis due to calcified plaque. Predominantly calcified plaque in the proximal left subclavian artery without significant stenosis. Right carotid system: Patent with calcified plaque about the carotid bifurcation. No evidence of significant stenosis or dissection. Retropharyngeal course of the distal common and proximal internal carotid arteries. Left carotid system: Patent with predominantly calcified plaque about the carotid bifurcation. No evidence of significant stenosis or dissection. Retropharyngeal course of the distal common and proximal external carotid arteries. Vertebral arteries: Patent with the left being moderately dominant. Moderate right vertebral artery origin stenosis. Skeleton: Advanced disc  degeneration at C4-5 and C5-6 as well as in the upper thoracic spine. Other neck: Suspected 1.6 cm left lower pole thyroid nodule, poorly evaluated due to beam hardening. Upper chest: No apical lung consolidation or mass. Review of the MIP images confirms the above findings CTA HEAD FINDINGS Anterior circulation: The internal carotid arteries are patent from skull base to carotid termini with mild cavernous and proximal supraclinoid stenosis bilaterally. The right MCA is patent without significant M1 stenosis. There is a severe stenosis of the proximal right M2 middle  division near the MCA trifurcation. A mild left M1 stenosis is noted. The ACAs are patent with mild branch vessel irregularity but no significant proximal stenosis. No aneurysm is identified. Posterior circulation: The intracranial vertebral arteries are patent to the basilar with the left being dominant. Atherosclerotic plaque results in mild to moderate left V4 stenosis. PICAs, AICAs, and SCAs are grossly patent bilaterally. The basilar artery is patent without evidence of significant stenosis. There is a large right posterior communicating artery with hypoplastic right P1 segment. There is a severe proximal left P1 stenosis. No aneurysm is identified. Venous sinuses: Patent. Anatomic variants: Predominantly fetal origin of the right PCA. Review of the MIP images confirms the above findings CT Brain Perfusion Findings: CBF (<30%) Volume: 0 mL Perfusion (Tmax>6.0s) volume: 10mL in the right parietal lobe Mismatch Volume: 10 mL Infarction Location: No core infarct by RAPID CBF criteria. IMPRESSION: 1. Negative for large vessel occlusion but positive for severe proximal right M2 stenosis. 2. Right parietal lobe ischemic penumbra without core infarct evident on CTP. 3. Mild bilateral intracranial ICA stenoses. 4. No cervical carotid artery stenosis. 5. Moderate right vertebral artery origin stenosis. 6.  Aortic Atherosclerosis (ICD10-I70.0). Preliminary CTA results were communicated to Dr. Wilford Corner at 7:39 am on 01/28/2019 by text page via the [facility] messaging system. Electronically Signed   By: Sebastian Ache M.D.   On: 01/26/2019 08:32   Dg Chest 1 View  Result Date: 01/28/2019 CLINICAL DATA:  Leukocytosis. EXAM: CHEST  1 VIEW COMPARISON:  03/20/2012 FINDINGS: Patient is slightly rotated to the left. Lungs are hypoinflated with minimal linear density over the left base likely atelectasis. No definite effusion. Possible mild cardiomegaly. Mild degenerate change of the spine. IMPRESSION: Hypoinflation with linear density  left base likely atelectasis. Electronically Signed   By: Elberta Fortis M.D.   On: 01/28/2019 14:23   Ct Angio Neck W Or Wo Contrast  Result Date: 02/10/2019 CLINICAL DATA:  Left facial droop, left-sided weakness, and slurred speech. EXAM: CT ANGIOGRAPHY HEAD AND NECK CT PERFUSION BRAIN TECHNIQUE: Multidetector CT imaging of the head and neck was performed using the standard protocol during bolus administration of intravenous contrast. Multiplanar CT image reconstructions and MIPs were obtained to evaluate the vascular anatomy. Carotid stenosis measurements (when applicable) are obtained utilizing NASCET criteria, using the distal internal carotid diameter as the denominator. Multiphase CT imaging of the brain was performed following IV bolus contrast injection. Subsequent parametric perfusion maps were calculated using RAPID software. CONTRAST:  ISOVUE-370 IOPAMIDOL (ISOVUE-370) INJECTION 76% COMPARISON:  None. FINDINGS: CTA NECK FINDINGS Aortic arch: Normal variant aortic arch branching pattern with common origin of the brachiocephalic and left common carotid arteries. Moderate calcified plaque in the aortic arch. No significant arch vessel origin stenosis. Mild right subclavian artery origin stenosis due to calcified plaque. Predominantly calcified plaque in the proximal left subclavian artery without significant stenosis. Right carotid system: Patent with calcified plaque about the carotid bifurcation. No evidence of significant  stenosis or dissection. Retropharyngeal course of the distal common and proximal internal carotid arteries. Left carotid system: Patent with predominantly calcified plaque about the carotid bifurcation. No evidence of significant stenosis or dissection. Retropharyngeal course of the distal common and proximal external carotid arteries. Vertebral arteries: Patent with the left being moderately dominant. Moderate right vertebral artery origin stenosis. Skeleton: Advanced disc  degeneration at C4-5 and C5-6 as well as in the upper thoracic spine. Other neck: Suspected 1.6 cm left lower pole thyroid nodule, poorly evaluated due to beam hardening. Upper chest: No apical lung consolidation or mass. Review of the MIP images confirms the above findings CTA HEAD FINDINGS Anterior circulation: The internal carotid arteries are patent from skull base to carotid termini with mild cavernous and proximal supraclinoid stenosis bilaterally. The right MCA is patent without significant M1 stenosis. There is a severe stenosis of the proximal right M2 middle division near the MCA trifurcation. A mild left M1 stenosis is noted. The ACAs are patent with mild branch vessel irregularity but no significant proximal stenosis. No aneurysm is identified. Posterior circulation: The intracranial vertebral arteries are patent to the basilar with the left being dominant. Atherosclerotic plaque results in mild to moderate left V4 stenosis. PICAs, AICAs, and SCAs are grossly patent bilaterally. The basilar artery is patent without evidence of significant stenosis. There is a large right posterior communicating artery with hypoplastic right P1 segment. There is a severe proximal left P1 stenosis. No aneurysm is identified. Venous sinuses: Patent. Anatomic variants: Predominantly fetal origin of the right PCA. Review of the MIP images confirms the above findings CT Brain Perfusion Findings: CBF (<30%) Volume: 0 mL Perfusion (Tmax>6.0s) volume: 10mL in the right parietal lobe Mismatch Volume: 10 mL Infarction Location: No core infarct by RAPID CBF criteria. IMPRESSION: 1. Negative for large vessel occlusion but positive for severe proximal right M2 stenosis. 2. Right parietal lobe ischemic penumbra without core infarct evident on CTP. 3. Mild bilateral intracranial ICA stenoses. 4. No cervical carotid artery stenosis. 5. Moderate right vertebral artery origin stenosis. 6.  Aortic Atherosclerosis (ICD10-I70.0). Preliminary  CTA results were communicated to Dr. Wilford CornerArora at 7:39 am on 01/27/2019 by text page [facility]. Electronically Signed   By: Sebastian AcheAllen  Grady M.D.   On: 02/02/2019 08:32   Mr Brain Wo Contrast  Result Date: 01/30/2019 CLINICAL DATA:  Stroke follow-up EXAM: MRI HEAD WITHOUT CONTRAST TECHNIQUE: Multiplanar, multiecho pulse sequences of the brain and surrounding structures were obtained without intravenous contrast. COMPARISON:  CTA head neck 02/08/2019 FINDINGS: BRAIN: Large infarct of the right posterior MCA territory. There is moderate associated edema. No midline shift or other mass effect. The midline structures are normal.The white matter signal is normal for the patient's age. The cerebral and cerebellar volume are age-appropriate. Susceptibility-sensitive sequences show no chronic microhemorrhage or superficial siderosis. VASCULAR: Major intracranial arterial and venous sinus flow voids are normal. SKULL AND UPPER CERVICAL SPINE: Calvarial bone marrow signal is normal. There is no skull base mass. Visualized upper cervical spine and soft tissues are normal. SINUSES/ORBITS: No fluid levels or advanced mucosal thickening. No mastoid or middle ear effusion. The orbits are normal. 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 No acute hemorrhage. Electronically Signed   By: Deatra RobinsonKevin  Herman M.D.   On: 01/30/2019 02:43   [place] Renal  Result Date: 01/27/2019 CLINICAL DATA:  Acute renal failure EXAM: RENAL / URINARY TRACT ULTRASOUND COMPLETE COMPARISON:  None. FINDINGS:  Right Kidney: Renal measurements: 11.8 x 5.5 x 6.4 cm = volume: 218.5 mL . Echogenicity within normal limits. No mass or hydronephrosis visualized. Left Kidney: Renal measurements: 12.5 x 7.3 x 6.1 cm = volume: 290.4 mL. Echogenicity within normal limits. No mass or hydronephrosis visualized. Bladder: Appears normal for degree of bladder distention. IMPRESSION: No  hydronephrosis. Electronically Signed   By: Annia Beltrew  Davis M.D.   On: 01/27/2019 16:44   Ct Cerebral Perfusion W Contrast  Result Date: 02/07/2019 CLINICAL DATA:  Left facial droop, left-sided weakness, and slurred speech. EXAM: CT ANGIOGRAPHY HEAD AND NECK CT PERFUSION BRAIN TECHNIQUE: Multidetector CT imaging of the head and neck was performed using the standard protocol during bolus administration of intravenous contrast. Multiplanar CT image reconstructions and MIPs were obtained to evaluate the vascular anatomy. Carotid stenosis measurements (when applicable) are obtained utilizing NASCET criteria, using the distal internal carotid diameter as the denominator. Multiphase CT imaging of the brain was performed following IV bolus contrast injection. Subsequent parametric perfusion maps were calculated using RAPID software. CONTRAST:  100mL ISOVUE-370 IOPAMIDOL (ISOVUE-370) INJECTION 76% COMPARISON:  None. FINDINGS: CTA NECK FINDINGS Aortic arch: Normal variant aortic arch branching pattern with common origin of the brachiocephalic and left common carotid arteries. Moderate calcified plaque in the aortic arch. No significant arch vessel origin stenosis. Mild right subclavian artery origin stenosis due to calcified plaque. Predominantly calcified plaque in the proximal left subclavian artery without significant stenosis. Right carotid system: Patent with calcified plaque about the carotid bifurcation. No evidence of significant stenosis or dissection. Retropharyngeal course of the distal common and proximal internal carotid arteries. Left carotid system: Patent with predominantly calcified plaque about the carotid bifurcation. No evidence of significant stenosis or dissection. Retropharyngeal course of the distal common and proximal external carotid arteries. Vertebral arteries: Patent with the left being moderately dominant. Moderate right vertebral artery origin stenosis. Skeleton: Advanced disc degeneration at  C4-5 and C5-6 as well as in the upper thoracic spine. Other neck: Suspected 1.6 cm left lower pole thyroid nodule, poorly evaluated due to beam hardening. Upper chest: No apical lung consolidation or mass. Review of the MIP images confirms the above findings CTA HEAD FINDINGS Anterior circulation: The internal carotid arteries are patent from skull base to carotid termini with mild cavernous and proximal supraclinoid stenosis bilaterally. The right MCA is patent without significant M1 stenosis. There is a severe stenosis of the proximal right M2 middle division near the MCA trifurcation. A mild left M1 stenosis is noted. The ACAs are patent with mild branch vessel irregularity but no significant proximal stenosis. No aneurysm is identified. Posterior circulation: The intracranial vertebral arteries are patent to the basilar with the left being dominant. Atherosclerotic plaque results in mild to moderate left V4 stenosis. PICAs, AICAs, and SCAs are grossly patent bilaterally. The basilar artery is patent without evidence of significant stenosis. There is a large right posterior communicating artery with hypoplastic right P1 segment. There is a severe proximal left P1 stenosis. No aneurysm is identified. Venous sinuses: Patent. Anatomic variants: Predominantly fetal origin of the right PCA. Review of the MIP images confirms the above findings CT Brain Perfusion Findings: CBF (<30%) Volume: 0 mL Perfusion (Tmax>6.0s) volume: 10mL in the right parietal lobe Mismatch Volume: 10 mL Infarction Location: No core infarct by RAPID CBF criteria. IMPRESSION: 1. Negative for large vessel occlusion but positive for severe proximal right M2 stenosis. 2. Right parietal lobe ischemic penumbra without core infarct evident on CTP. 3. Mild bilateral intracranial ICA  stenoses. 4. No cervical carotid artery stenosis. 5. Moderate right vertebral artery origin stenosis. 6.  Aortic Atherosclerosis (ICD10-I70.0). Preliminary CTA results were  communicated to Dr. Wilford Corner at 7:39 am on 02/07/2019 by text page via the [facility] messaging system. Electronically Signed   By: Sebastian Ache M.D.   On: 01/21/2019 08:32   Dg Chest Port 1 View  Result Date: 01/30/2019 CLINICAL DATA:  History of endotracheal tube EXAM: PORTABLE CHEST 1 VIEW COMPARISON:  Two days ago FINDINGS: Endotracheal tube tip at the clavicular heads. The orogastric tube at least reaches the stomach. Low volume chest with hazy opacities at the bases. Probable pleural fluid on the left. IMPRESSION: 1. Unremarkable hardware positioning. 2. Atelectasis or infection at the bases with probable small left effusion. Electronically Signed   By: Marnee Spring M.D.   On: 01/30/2019 08:49   Dg Abd Portable 1v  Result Date: 01/28/2019 CLINICAL DATA:  OG tube placement EXAM: PORTABLE ABDOMEN - 1 VIEW COMPARISON:  None. FINDINGS: OG tube tip is in the mid stomach. Nonobstructed bowel gas pattern. Left lower lobe atelectasis or infiltrate. IMPRESSION: OG tube tip in the mid stomach. Electronically Signed   By: Charlett Nose M.D.   On: 02/10/2019 22:29   Ct Head Code Stroke Wo Contrast  Result Date: 02/03/2019 CLINICAL DATA:  Code stroke. Left facial droop, left-sided weakness, and slurred speech. EXAM: CT HEAD WITHOUT CONTRAST TECHNIQUE: Contiguous axial images were obtained from the base of the skull through the vertex without intravenous contrast. COMPARISON:  None. FINDINGS: Brain: There is no evidence of acute infarct, intracranial hemorrhage, mass, midline shift, or extra-axial fluid collection. Mild cerebral atrophy is within normal limits for age. Cerebral white matter hypodensities are nonspecific but compatible with mild chronic small vessel ischemic disease. Vascular: Calcified atherosclerosis at the skull base. No hyperdense vessel. Skull: No fracture or suspicious osseous lesion. Sinuses/Orbits: Left mastoid effusion. Clear paranasal sinuses. Bilateral cataract extraction. Other: None.  ASPECTS [facility] Stroke Program Early CT Score) - Ganglionic level infarction (caudate, lentiform nuclei, internal capsule, insula, M1-M3 cortex): 7 - Supraganglionic infarction (M4-M6 cortex): 3 Total score (0-10 with 10 being normal): 10 IMPRESSION: 1. No evidence of acute intracranial abnormality. 2. ASPECTS is 10. These results were communicated to Dr. Wilford Corner at 7:39 am on 02/10/2019 by text page via the [facility] messaging system. Electronically Signed   By: Sebastian Ache M.D.   On: 02/11/2019 07:44    Labs:  CBC: Recent Labs    01/23/2019 1230  02/13/2019 1800 01/20/2019 2200 01/30/19 0404 01/30/19 0439  WBC 12.1*  --  9.2 10.0 9.1  --   HGB 7.0*   < > 7.7* 7.9* 7.9* 10.2*  HCT 22.9*   < > 24.5* 25.4* 25.4* 30.0*  PLT 469*  --  410* 405* 443*  --    < > = values in this interval not displayed.    COAGS: Recent Labs    01/22/2019 0835 02/03/2019 1230  INR 1.24 1.18    BMP: Recent Labs    01/28/19 0352 02/13/2019 0835  02/01/2019 1230 01/28/2019 1346 02/02/2019 1514 01/30/19 0404 01/30/19 0439  NA 132* 140   < > 135 135 135 138 138  K 4.8 4.0   < > 4.6 4.2 4.2 3.8 3.8  CL 97* 105  --  102  --   --  107  --   CO2 20* 20*  --  22  --   --  23  --   GLUCOSE 147* 134*  --  162*  --   --  158*  --   BUN 57* 40*  --  42*  --   --  34*  --   CALCIUM 8.4* 7.3*  --  8.1*  --   --  8.2*  --   CREATININE 2.84* 1.18*  --  1.33*  --   --  1.23*  --   GFRNONAA 16* 46*  --  40*  --   --  43*  --   GFRAA 18* 53*  --  46*  --   --  50*  --    < > = values in this interval not displayed.    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 Corliss Skains. Patient's condition stable- remains intubated with sedation, can move left side, no movements of LUE, LLE  withdraws from pain. Right groin incision stable. Appreciate and agree with neurology and CCM management. IR to follow.   Electronically Signed: Elwin Mocha, PA-C 01/30/2019, 9:32 AM   I spent a total of 25 Minutes at the the patient's bedside AND on the patient's hospital floor or unit, greater than 50% of which was counseling/coordinating care for right MCA superior division occlusion s/p revascularization.

## 2019-01-30 NOTE — Progress Notes (Addendum)
Initial Nutrition Assessment  DOCUMENTATION CODES:   Obesity unspecified  INTERVENTION:   -Initiate Vital High Protein @ 50 ml/hr via OGT (1200 ml) -MVI daily   Provides: 1200 kcals (1337 kcal with propofol), 105 grams protein, 1003 ml free water.   NUTRITION DIAGNOSIS:   Increased nutrient needs related to post-op healing as evidenced by estimated needs.  GOAL:   Provide needs based on ASPEN/SCCM guidelines  MONITOR:   Diet advancement, Vent status, TF tolerance, Weight trends, Labs, I & O's  REASON FOR ASSESSMENT:   Consult Enteral/tube feeding initiation and management  ASSESSMENT:   Patient with PMH significant for DM, CAD, COPD, HTN, and HLD. Underwent ORIF of humeral fracture on 2/7. On 2/11 was found to have sudden onset left facial droop and left hemiparesis. CTA revealed R MCA.    2/7- ORIF humeral fracture 2/11- revascularization of R MCA  Pt discussed during ICU rounds and with RN.  No plans for extubation today, okay to start TF.  No BM charted in 5 days, bowel regimen in place.   Spoke with daughter at bedside who denies pt had loss of appetite PTA. Pt typically eats two meals daily that consist of an english muffin with jelly for breakfast and a meat with vegetables for dinner. Meal completions charted prior to intubation charted as 25-100%. Daughter endorses pt's UBW stays around 190 lb. Pt had small amount of weight loss recently but daughter thinks this is related to change in lasix prescription.   Per chart records, pt weighed 94.3 kg on 09/25/18 and 86 kg this admission. Unsure how much is dry wt loss versus fluid loss given daughters reports. Will use EDW of 86 kg to estimate needs.   Patient is currently intubated on ventilator support MV: 9.4 L/min Temp (24hrs), Avg:98.4 F (36.9 C), Min:98.1 F (36.7 C), Max:98.7 F (37.1 C) BP: 128/58 MAP: 76  Propofol: 5.2 ml/hr- provides 137 kcal  Cleviprex: discontinued   I/O: +2088 ml since  admit UOP: 1925 ml x 24 hrs  OGT: 200 ml x 24 hrs   Medications reviewed and include: colace, SS novolog, Vit C, NS @ 75 ml/hr, propofol Labs reviewed.   NUTRITION - FOCUSED PHYSICAL EXAM:    Most Recent Value  Orbital Region  No depletion  Upper Arm Region  No depletion  Thoracic and Lumbar Region  Unable to assess  Buccal Region  Unable to assess  Temple Region  No depletion  Clavicle Bone Region  No depletion  Clavicle and Acromion Bone Region  No depletion  Scapular Bone Region  Unable to assess  Dorsal Hand  No depletion  Patellar Region  No depletion  Anterior Thigh Region  No depletion  Posterior Calf Region  No depletion  Edema (RD Assessment)  Mild  Hair  Reviewed  Eyes  Unable to assess  Mouth  Unable to assess  Skin  Reviewed  Nails  Reviewed     Diet Order:   Diet Order            Diet NPO time specified  Diet effective now              EDUCATION NEEDS:   Not appropriate for education at this time  Skin:  Skin Assessment: Skin Integrity Issues: Skin Integrity Issues:: Incisions Incisions: right arm, right groin   Last BM:  2/6  Height:   Ht Readings from Last 1 Encounters:  2019/02/24 5\' 1"  (1.549 m)    Weight:   Wt Readings  from Last 1 Encounters:  01/22/2019 86 kg    Ideal Body Weight:  47.7 kg  BMI:  Body mass index is 35.82 kg/m.  Estimated Nutritional Needs:   Kcal:  1000-1204 kcal  Protein:  95-115 grams  Fluid:  >/= 1.2 L/day   Vanessa Kickarly Johnchristopher Sarvis RD, LDN Clinical Nutrition Pager # - [phone]

## 2019-01-30 NOTE — Progress Notes (Signed)
Patient ID: Angelica [name], female   DOB: 1946/10/17, 73 y.o.   MRN: [id number] Patient seen and examined.  Patient has good volitional control of the right upper extremity.  She is still intubated.  I discussed all issues with her daughter.  From our standpoint the right side is fairly stable.  The big issues will be the hurdles after her CVA.  Dhana Totton MD

## 2019-01-30 NOTE — Progress Notes (Signed)
SLP Cancellation Note  Patient Details Name: Angelica [name] MRN: [id number] DOB: 19-Nov-1946   Cancelled treatment:       Reason Eval/Treat Not Completed: Medical issues which prohibited therapy. Pt intubated, will follow for readiness.    Buffie Herne, Riley Nearing 01/30/2019, 7:35 AM

## 2019-01-30 NOTE — Progress Notes (Signed)
OT Cancellation Note  Patient Details Name: Angelica [name] MRN: [id number] DOB: Aug 07, 1946   Cancelled Treatment:    Reason Eval/Treat Not Completed: Patient not medically ready. Pt intubated and sedated.  Per RN attempting to decrease sedation, but becoming agitated.  Will follow and initiate OT re-evaluation when medically appropriate and able.   Chancy Milroy, OT Acute Rehabilitation Services Pager [phone] Office [phone]    Chancy Milroy 01/30/2019, 7:58 AM

## 2019-01-30 NOTE — Progress Notes (Signed)
PT Cancellation Note  Patient Details Name: Angelica [name] MRN: [id number] DOB: Apr 14, 1946   Cancelled Treatment:    Reason Eval/Treat Not Completed: Medical issues which prohibited therapy. Pt intubated and sedated s/p CODE stroke yesterday.  Per RN attempting to decrease sedation, but becoming agitated.  Will follow and initiate PT re-evaluation when medically appropriate and able.   Lewis Shock, PT, DPT Acute Rehabilitation Services Pager #: [phone] Office #: [phone]    Iona Hansen 01/30/2019, 9:09 AM

## 2019-01-30 NOTE — Progress Notes (Signed)
Patient transported to MRI and back to 4N26 on the ventilator with no complications. Vitals stable.

## 2019-01-30 NOTE — Progress Notes (Signed)
  Echocardiogram 2D Echocardiogram has been performed.  Leta Jungling M 01/30/2019, 2:16 PM

## 2019-01-30 NOTE — Progress Notes (Signed)
NAME:  Angelica [name], MRN:  [id number], DOB:  06/25/1946, LOS: 5 ADMISSION DATE:  01/22/2019, CONSULTATION DATE:  02/02/2019 REFERRING MD:  Wilford CornerArora CHIEF COMPLAINT:   Brief History   73 year old female past medical history of tobacco abuse, 30 pack years, no quit date, diabetes, coronary disease, COPD, hypertension and hyperlipidemia who recently underwent open reduction internal fixation of the right upper extremity humeral fracture, with sudden onset of left facial droop, dysarthria and left hemiparesis . CTA brain revealed scan positive for severe proximal right M2 stenosis. Right parietal lobe ischemic penumbra without core infarct evident on CTP. Mild bilateral intracranial ICA stenoses. She underwent RT common carotid arteriogram followed by  re-vascularization of occluded branch of the RT MCA sup division achieving a TICI  3 revascularization with reocclusion due to underlying arteriosclerosis.PCCM have been consulted for vent management in patient with known history of COPD.Marland Kitchen. History of present illness   History obtained from medical record and family, as patient is intubated and sedated.  73 year old female past medical history of tobacco abuse, 30 pack years, no quit date, diabetes, coronary disease, COPD, hypertension and hyperlipidemia who recently underwent open reduction internal fixation of the right upper extremity humeral fracture, with sudden onset of left facial droop, dysarthria and left hemiparesis with right gaze deviation with a last known normal at 6:15 AM 01/24/2019. CTA head and neck with severe right M2 stenosis versus complete occlusion. 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 No TPA due to recent surgery with plates in place. Due to the proximal vascular stenosis versus occlusion, case discussed with endovascular, Dr. Corliss Skainseveshwar as well as patient's daughter and patient taken for a diagnostic cerebral angiogram with the intent to perform angioplasty versus  thrombectomy as deemed appropriate after the diagnostic angiogram.She underwent  RT common carotid arteriogram followed by revascularization of occluded branch of the RT MCA sup division achieving a TICI  3 revascularization with reocclusion due to underlying arteriosclerosis.Marland Kitchen. Rescue stenting not performed due the small diameter of the the involved branch,and potential increased risk of ICH with adjuvant dual antiplatelets. Patients HG 6.1 on 2/11 She was intubated by anesthesia due to her history of COPD. PCCM have been asked to consult for vent management with goal of extubation.  Past Medical History    Past Medical History:  Diagnosis Date  . Arthritis   . Asthma   . COPD (chronic obstructive pulmonary disease) (HCC)   . Coronary artery disease   . Diabetes mellitus   . Hypertension    Family History Family History  Problem Relation Age of Onset  . Pneumonia Mother   . Lupus Mother   . Lung cancer Father     Significant Hospital Events   02/11/2019  Admission for open reduction internal fixation of the right upper extremity humeral fracture  01/28/2019 Developed facial droop, Middle cerebral artery embolism, right   01/31/2019 RT common carotid arteriogram followed by revascularization of occluded branch of the RT MCA sup division achieving a TICI  3 revascularization with reocclusion due to underlying arteriosclerosis .Marland Kitchen.  Consults:  01/30/2019 PCCM  Procedures:  01/22/2019 S/P RT common carotid arteriogram followed by revascularization of occluded branch of the RT MCA sup division achieving a TICI  3 revascularization with reocclusion due to underlying arteriosclerosis.Marland Kitchen. Rescue stenting not performed due the small diameter of the the involved branch,and potential increased risk of ICH with adjuvant dual antiplatelets. Patients HG 6.1 on 2/11  Significant Diagnostic Tests:   CT Angio  Head> 02/10/2019 Negative for large vessel occlusion but positive for severe proximal right  M2 stenosis. Right parietal lobe ischemic penumbra without core infarct evident on CTP. Mild bilateral intracranial ICA stenoses. No cervical carotid artery stenosis. Moderate right vertebral artery origin stenosis. Aortic Atherosclerosis (ICD10-I70.0).  Post Procedure Ct Brain  02/13/2019 No ICH or mass effect.  MRI brain is noted  01/30/2019 chest x-ray reviewed reveals bilateral atelectasis. Micro Data:  01/21/2019>> MRSA PCR>> Negative 01/28/2019>> Urine Culture>>  Antimicrobials:  None   Interim history/subjective:  Remains intubated Will need to wean sedation for any possibility of extubation near future  Objective   Blood pressure (!) 128/51, pulse 78, temperature 98.4 F (36.9 C), temperature source Axillary, resp. rate 16, height 5\' 1"  (1.549 m), weight 86 kg, SpO2 99 %.    Vent Mode: CPAP;PSV FiO2 (%):  [40 %-50 %] 40 % Set Rate:  [16 bmp-24 bmp] 24 bmp Vt Set:  [380 mL] 380 mL PEEP:  [5 cmH20] 5 cmH20 Pressure Support:  [5 cmH20] 5 cmH20 Plateau Pressure:  [15 cmH20-17 cmH20] 15 cmH20   Intake/Output Summary (Last 24 hours) at 01/30/2019 1119 Last data filed at 01/30/2019 1000 Gross per 24 hour  Intake 2126.13 ml  Output 1425 ml  Net 701.13 ml   Filed Weights   2019/02/22 1139  Weight: 86 kg    Examination: General: Elderly female who is sedated on the vent with propofol HEENT: Endotracheal orogastric tube in place Neuro: Currently heavily sedated with propofol not moving spontaneously CV: Sounds are regular regular rate and rhythm PULM: Decreased breath sounds at bases ZO:XWRU, non-tender, bsx4 active  Extremities: warm/dry, 1+ edema, right arm sling in place good capillary fill in her right fingers Skin: no rashes or lesions   Resolved Hospital Problem list     Assessment & Plan:  Acute Ischemic Stroke Underwent  RT common carotid arteriogram followed by revascularization of occluded branch of the RT MCA sup division achieving a TICI  3  revascularization with reocclusion due to underlying arteriosclerosis.Marland Kitchen MRI 01/30/2019 shows large acute infarct posterior right MCA territory with moderate edema but no midline shift no acute hemorrhage Plan Per neurology MRI brain is noted 2D echo is pending Aspirin when okay with orthopedics Atorvastatin 80 A1c Lipid panel Frequent neurochecks    Acute on Chronic Respiratory Failure SP revascularization of occluded branch of the RT MCA sup division Hx of COPD >> Dulera  And Incruse as home maintenence No PFT's on file Acidosis per ABG Plan Female full vent support. We will need to decrease respiratory rate Arterial gases Titrate oxygen and PEEP as able Saturations goal is 88 to 92% Currently on propofol and poorly responsive PRN fentanyl Continue to follow chest x-rays daily Bronchodilators Pulmonary toilet once extubated   Hypotension 2/2 sedation >> fentanyl and propofol History of essential hypertension Appears to be sensitive to Fentanyl Acidosis per ABG Plan To my sedation To monitoring Titrate RA SS goal down as tolerated Keep systolic pressure 1 20-1 40 Monitor lactate on 01/30/2019 currently 0.6  Iron deficiency Anemia HGB 6.1 Receiving 1 unit PRBC's Recent Labs    01/30/19 0404 01/30/19 0439  HGB 7.9* 10.2*    Plan Transfuse per protocol Hemoglobin increased from 7.9-10.2 Monitor for any source of bleeding  Acute on Chronic Renal Failure Hyperkalemia>> resolved Renal US>> NO hydro Creatinine 1.33 GFR 40 ml/ min Lab Results  Component Value Date   CREATININE 1.23 (H) 01/30/2019   CREATININE 1.33 (H) 02/03/2019   CREATININE 1.18 (  H) 02/15/2019   CREATININE 0.7 10/06/2017   Recent Labs  Lab 01/27/2019 1514 01/30/19 0404 01/30/19 0439  K 4.2 3.8 3.8    Plan: Hold diuretics Anti-biotics have been DC'd Avoid nephrotoxins Monitor creatinine and potassium   Leukocytosis Afebrile Plan Follow white count and fever curve As  needed Chest x-ray with atelectasis versus infection at bases Follow culture data Antimicrobial therapy   GI Plan Currently n.p.o. Pepcid We will put in nutrition consult for tube feeding 01/30/2019  Endocrine CBG (last 3)  Recent Labs    01/30/19 0027 01/30/19 0358 01/30/19 0804  GLUCAP 112* 146* 128*    DM Oral agents at home Plan Sliding scale insulin  Nonunion supracondylar humerus fracture, right upper extremity Per Ortho Plan Soft splint and sling per Ortho Monitor vascular checks of the right upper extremity  Best practice:  Diet: NPO Pain/Anxiety/Delirium protocol (if indicated): Propofol and fentanyl VAP protocol (if indicated): NA DVT prophylaxis: SCD's hose  GI prophylaxis:Protonix Glucose control: CBG Q 4 with SSI Mobility: BR Code Status: Full Family Communication: Daughter updated at bedside 01/30/2019 Disposition: Neuro ICU  Labs   CBC: Recent Labs  Lab 01/26/19 0337  02/13/2019 0835  02/14/2019 1230  02/10/2019 1514 01/24/2019 1800 02/10/2019 2200 01/30/19 0404 01/30/19 0439  WBC 18.3*   < > 9.3  --  12.1*  --   --  9.2 10.0 9.1  --   NEUTROABS 16.2*  --  7.6  --  9.8*  --   --   --   --  7.5  --   HGB 9.3*   < > 6.1*   < > 7.0*   < > 7.5* 7.7* 7.9* 7.9* 10.2*  HCT 29.7*   < > 20.2*   < > 22.9*   < > 22.0* 24.5* 25.4* 25.4* 30.0*  MCV 97.7   < > 99.5  --  99.1  --   --  95.7 96.2 97.7  --   PLT 576*   < > 372  --  469*  --   --  410* 405* 443*  --    < > = values in this interval not displayed.    Basic Metabolic Panel: Recent Labs  Lab 01/27/19 0340 01/28/19 0352 01/21/2019 0835  02/09/2019 1230 01/28/2019 1346 02/02/2019 1443 01/23/2019 1514 01/30/19 0404 01/30/19 0439  NA 134* 132* 140   < > 135 135  --  135 138 138  K 3.9 4.8 4.0   < > 4.6 4.2  --  4.2 3.8 3.8  CL 90* 97* 105  --  102  --   --   --  107  --   CO2 28 20* 20*  --  22  --   --   --  23  --   GLUCOSE 231* 147* 134*  --  162*  --   --   --  158*  --   BUN 44* 57* 40*  --   42*  --   --   --  34*  --   CREATININE 2.73* 2.84* 1.18*  --  1.33*  --   --   --  1.23*  --   CALCIUM 9.1 8.4* 7.3*  --  8.1*  --   --   --  8.2*  --   MG  --   --   --   --   --   --  2.0  --  2.1  --   PHOS  --   --   --   --   --   --   --   --  3.5  --    < > = values in this interval not displayed.   GFR: Estimated Creatinine Clearance: 40.6 mL/min (A) (by C-G formula based on SCr of 1.23 mg/dL (H)). Recent Labs  Lab 01/22/2019 1230 02/09/2019 1443 02/12/2019 1800 02/10/2019 2200 01/30/19 0404  WBC 12.1*  --  9.2 10.0 9.1  LATICACIDVEN  --  0.6  --   --  0.6    Liver Function Tests: Recent Labs  Lab 02/15/2019 2255  AST 16  ALT 15  ALKPHOS 37*  BILITOT 0.7  PROT 7.0  ALBUMIN 3.1*   No results for input(s): LIPASE, AMYLASE in the last 168 hours. No results for input(s): AMMONIA in the last 168 hours.  ABG    Component Value Date/Time   PHART 7.305 (L) 01/30/2019 0439   PCO2ART 43.4 01/30/2019 0439   PO2ART 81.0 (L) 01/30/2019 0439   HCO3 21.6 01/30/2019 0439   TCO2 23 01/30/2019 0439   ACIDBASEDEF 5.0 (H) 01/30/2019 0439   O2SAT 95.0 01/30/2019 0439     Coagulation Profile: Recent Labs  Lab 01/28/2019 0835 02/08/2019 1230  INR 1.24 1.18    Cardiac Enzymes: No results for input(s): CKTOTAL, CKMB, CKMBINDEX, TROPONINI in the last 168 hours.  HbA1C: Hgb A1c MFr Bld  Date/Time Value Ref Range Status  01/30/2019 04:04 AM 6.1 (H) 4.8 - 5.6 % Final    Comment:    (NOTE) Pre diabetes:          5.7%-6.4% Diabetes:              >6.4% Glycemic control for   <7.0% adults with diabetes   02/04/2019 10:55 PM 6.4 (H) 4.8 - 5.6 % Final    Comment:    (NOTE) Pre diabetes:          5.7%-6.4% Diabetes:              >6.4% Glycemic control for   <7.0% adults with diabetes     CBG: Recent Labs  Lab 01/30/2019 1535 01/23/2019 1955 01/30/19 0027 01/30/19 0358 01/30/19 0804  GLUCAP 144* 86 112* 146* 128*     Critical care APP time: 30 minutes      Brett Canales Minor  ACNP [facility] PCCM Pager [phone] till 1 pm If no answer page [phone] 01/30/2019, 11:19 AM

## 2019-01-31 ENCOUNTER — Encounter (HOSPITAL_COMMUNITY): Payer: Self-pay | Admitting: Interventional Radiology

## 2019-01-31 ENCOUNTER — Inpatient Hospital Stay (HOSPITAL_COMMUNITY): Payer: Medicare Other

## 2019-01-31 DIAGNOSIS — I639 Cerebral infarction, unspecified: Secondary | ICD-10-CM

## 2019-01-31 LAB — GLUCOSE, CAPILLARY
Glucose-Capillary: 138 mg/dL — ABNORMAL HIGH (ref 70–99)
Glucose-Capillary: 121 mg/dL — ABNORMAL HIGH (ref 70–99)
Glucose-Capillary: 134 mg/dL — ABNORMAL HIGH (ref 70–99)
Glucose-Capillary: 145 mg/dL — ABNORMAL HIGH (ref 70–99)
Glucose-Capillary: 123 mg/dL — ABNORMAL HIGH (ref 70–99)
Glucose-Capillary: 71 mg/dL (ref 70–99)

## 2019-01-31 LAB — MAGNESIUM
Magnesium: 2.1 mg/dL (ref 1.7–2.4)
Magnesium: 2 mg/dL (ref 1.7–2.4)
Magnesium: 1.9 mg/dL (ref 1.7–2.4)

## 2019-01-31 LAB — CBC WITH DIFFERENTIAL/PLATELET
ABS IMMATURE GRANULOCYTES: 0.03 10*3/uL (ref 0.00–0.07)
BASOS PCT: 0 %
Basophils Absolute: 0 10*3/uL (ref 0.0–0.1)
Eosinophils Absolute: 0 10*3/uL (ref 0.0–0.5)
Eosinophils Relative: 0 %
HCT: 24.6 % — ABNORMAL LOW (ref 36.0–46.0)
Hemoglobin: 7.7 g/dL — ABNORMAL LOW (ref 12.0–15.0)
Immature Granulocytes: 0 %
Lymphocytes Relative: 9 %
Lymphs Abs: 0.8 10*3/uL (ref 0.7–4.0)
MCH: 30.3 pg (ref 26.0–34.0)
MCHC: 31.3 g/dL (ref 30.0–36.0)
MCV: 96.9 fL (ref 80.0–100.0)
MONOS PCT: 9 %
Monocytes Absolute: 0.8 10*3/uL (ref 0.1–1.0)
Neutro Abs: 7.1 10*3/uL (ref 1.7–7.7)
Neutrophils Relative %: 82 %
Platelets: 453 10*3/uL — ABNORMAL HIGH (ref 150–400)
RBC: 2.54 MIL/uL — ABNORMAL LOW (ref 3.87–5.11)
RDW: 15.2 % (ref 11.5–15.5)
WBC: 8.8 10*3/uL (ref 4.0–10.5)
nRBC: 0 % (ref 0.0–0.2)

## 2019-01-31 LAB — BASIC METABOLIC PANEL
Anion gap: 8 (ref 5–15)
BUN: 23 mg/dL (ref 8–23)
CO2: 22 mmol/L (ref 22–32)
Calcium: 8.4 mg/dL — ABNORMAL LOW (ref 8.9–10.3)
Chloride: 112 mmol/L — ABNORMAL HIGH (ref 98–111)
Creatinine, Ser: 0.93 mg/dL (ref 0.44–1.00)
GFR calc Af Amer: >60 mL/min (ref >60)
GFR calc non Af Amer: >60 mL/min (ref >60)
Glucose, Bld: 151 mg/dL — ABNORMAL HIGH (ref 70–99)
Potassium: 4 mmol/L (ref 3.5–5.1)
Sodium: 142 mmol/L (ref 135–145)

## 2019-01-31 LAB — PHOSPHORUS
Phosphorus: 3.8 mg/dL (ref 2.5–4.6)
Phosphorus: 3.6 mg/dL (ref 2.5–4.6)
Phosphorus: 3.3 mg/dL (ref 2.5–4.6)

## 2019-01-31 MED ORDER — FUROSEMIDE 10 MG/ML IJ SOLN
40.0000 mg | Freq: Two times a day (BID) | INTRAMUSCULAR | Status: AC
  Administered 2019-01-31 (×2): 40 mg via INTRAVENOUS
  Filled 2019-01-31 (×2): qty 4

## 2019-01-31 MED ORDER — BISACODYL 10 MG RE SUPP
10.0000 mg | Freq: Every day | RECTAL | Status: DC | PRN
  Administered 2019-02-06: 10 mg via RECTAL
  Filled 2019-01-31: qty 1

## 2019-01-31 MED ORDER — SENNOSIDES 8.8 MG/5ML PO SYRP
5.0000 mL | ORAL_SOLUTION | Freq: Every evening | ORAL | Status: DC | PRN
  Filled 2019-01-31: qty 5

## 2019-01-31 MED ORDER — DEXMEDETOMIDINE HCL IN NACL 400 MCG/100ML IV SOLN
0.4000 ug/kg/h | INTRAVENOUS | Status: DC
  Administered 2019-01-31: 0.8 ug/kg/h via INTRAVENOUS
  Filled 2019-01-31 (×2): qty 100

## 2019-01-31 NOTE — Progress Notes (Signed)
Patient ID: Angelica [name], female   DOB: 1946/10/05, 73 y.o.   MRN: [id number] Events noted regarding patient's progress.  I will plan for a dressing change in 9 to 10 days.  We will continue to follow Angelica [name] progress.  I will be out of my office until next week on Thursday.  I will have my partners available for any issues regarding the right arm.  Patient's daughter has my cellular phone should any problems occur and I discussed these issues in my absence over the next few days.  Status post supracondylar humerus fracture nonunion with ORIF performed and recent CVA event as described.  Please call for any problems.  Ashunti Schofield MD cell phone [phone]

## 2019-01-31 NOTE — Progress Notes (Addendum)
NAME:  Angelica [name], MRN:  [id number], DOB:  17-Jul-1946, LOS: 6 ADMISSION DATE:  01/20/2019, CONSULTATION DATE:  02/12/19 REFERRING MD:  Wilford Corner CHIEF COMPLAINT:   Brief History   73 year old female past medical history of tobacco abuse, 30 pack years, no quit date, diabetes, coronary disease, COPD, hypertension and hyperlipidemia who recently underwent open reduction internal fixation of the right upper extremity humeral fracture, with sudden onset of left facial droop, dysarthria and left hemiparesis . CTA brain revealed scan positive for severe proximal right M2 stenosis. Right parietal lobe ischemic penumbra without core infarct evident on CTP. Mild bilateral intracranial ICA stenoses. She underwent RT common carotid arteriogram followed by  re-vascularization of occluded branch of the RT MCA sup division achieving a TICI  3 revascularization with reocclusion due to underlying arteriosclerosis.PCCM have been consulted for vent management in patient with known history of COPD.Marland Kitchen History of present illness   History obtained from medical record and family, as patient is intubated and sedated.  73 year old female past medical history of tobacco abuse, 30 pack years, no quit date, diabetes, coronary disease, COPD, hypertension and hyperlipidemia who recently underwent open reduction internal fixation of the right upper extremity humeral fracture, with sudden onset of left facial droop, dysarthria and left hemiparesis with right gaze deviation with a last known normal at 6:15 AM 02/12/19. CTA head and neck with severe right M2 stenosis versus complete occlusion. 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 No TPA due to recent surgery with plates in place. Due to the proximal vascular stenosis versus occlusion, case discussed with endovascular, Dr. Corliss Skains as well as patient's daughter and patient taken for a diagnostic cerebral angiogram with the intent to perform angioplasty versus  thrombectomy as deemed appropriate after the diagnostic angiogram.She underwent  RT common carotid arteriogram followed by revascularization of occluded branch of the RT MCA sup division achieving a TICI  3 revascularization with reocclusion due to underlying arteriosclerosis.Marland Kitchen Rescue stenting not performed due the small diameter of the the involved branch,and potential increased risk of ICH with adjuvant dual antiplatelets. Patients HG 6.1 on 2/11 She was intubated by anesthesia due to her history of COPD. PCCM have been asked to consult for vent management with goal of extubation.  Past Medical History    Past Medical History:  Diagnosis Date  . Arthritis   . Asthma   . COPD (chronic obstructive pulmonary disease) (HCC)   . Coronary artery disease   . Diabetes mellitus   . Hypertension    Family History Family History  Problem Relation Age of Onset  . Pneumonia Mother   . Lupus Mother   . Lung cancer Father     Significant Hospital Events   01/19/2019  Admission for open reduction internal fixation of the right upper extremity humeral fracture  02/12/2019 Developed facial droop, Middle cerebral artery embolism, right   02/12/2019 RT common carotid arteriogram followed by revascularization of occluded branch of the RT MCA sup division achieving a TICI  3 revascularization with reocclusion due to underlying arteriosclerosis .Marland Kitchen  Consults:  02-12-2019 PCCM  Procedures:  2019-02-12 S/P RT common carotid arteriogram followed by revascularization of occluded branch of the RT MCA sup division achieving a TICI  3 revascularization with reocclusion due to underlying arteriosclerosis.Marland Kitchen Rescue stenting not performed due the small diameter of the the involved branch,and potential increased risk of ICH with adjuvant dual antiplatelets. Patients HG 6.1 on 2/11  Significant Diagnostic Tests:   CT Angio  Head> 01/24/2019 Negative for large vessel occlusion but positive for severe proximal right  M2 stenosis. Right parietal lobe ischemic penumbra without core infarct evident on CTP. Mild bilateral intracranial ICA stenoses. No cervical carotid artery stenosis. Moderate right vertebral artery origin stenosis. Aortic Atherosclerosis (ICD10-I70.0).  Post Procedure Ct Brain  02/14/2019 No ICH or mass effect.  MRI brain is noted  01/30/2019 chest x-ray reviewed reveals bilateral atelectasis. Micro Data:  02/03/2019>> MRSA PCR>> Negative 01/28/2019>> Urine Culture>> negative  Antimicrobials:  None   Interim history/subjective:  Remains intubated Will need to wean sedation for any possibility of extubation near future  Objective   Blood pressure (!) 137/53, pulse 72, temperature 98.8 F (37.1 C), temperature source Axillary, resp. rate (!) 22, height 5\' 1"  (1.549 m), weight 86 kg, SpO2 100 %.    Vent Mode: PSV;CPAP FiO2 (%):  [30 %-40 %] 30 % Set Rate:  [24 bmp] 24 bmp Vt Set:  [380 mL] 380 mL PEEP:  [5 cmH20] 5 cmH20 Pressure Support:  [5 cmH20-10 cmH20] 5 cmH20 Plateau Pressure:  [15 cmH20-21 cmH20] 17 cmH20   Intake/Output Summary (Last 24 hours) at 01/31/2019 0911 Last data filed at 01/31/2019 0800 Gross per 24 hour  Intake 2003.09 ml  Output 1500 ml  Net 503.09 ml   Filed Weights   01/20/2019 1139  Weight: 86 kg    Examination: General: Obese female who is more awake on Precedex HEENT: Endotracheal tube is in place orogastric tube is in place Neuro: Tracks and follows.  Moves right side to command left hemiplegia is noted CV: Sounds are regular PULM: even/non-labored, lungs bilaterally coarse rhonchi both bases ZO:XWRUGI:soft, non-tender, bsx4 active  Extremities: warm/dry, plus edema right arm is in sling with a soft cast fingers with good capillary filling warm to touch Skin: no rashes or lesions    Resolved Hospital Problem list     Assessment & Plan:  Acute Ischemic Stroke Underwent  RT common carotid arteriogram followed by revascularization of occluded  branch of the RT MCA sup division achieving a TICI  3 revascularization with reocclusion due to underlying arteriosclerosis.Marland Kitchen. MRI 01/30/2019 shows large acute infarct posterior right MCA territory with moderate edema but no midline shift no acute hemorrhage Plan Per neurology 2D echo reveals EF of 55 to 60% When okay with orthopedics Neurochecks per unit protocol    Acute on Chronic Respiratory Failure SP revascularization of occluded branch of the RT MCA sup division Hx of COPD >> Dulera  And Incruse as home maintenence No PFT's on file Acidosis per ABG Plan Currently on 5/5 moving approximately 450 cc tidal volume Diuresis with Lasix x2 over the next 24 hours and reevaluate for possible extubation either 01/31/2019 p.m. or 02/01/2019 a.m. Off propofol use of Precedex Fentanyl Serial chest x-rays Broncho-dilators Suspect she may have undiagnosed obstructive sleep apnea and may require BiPAP post extubation   Hypotension 2/2 sedation >> fentanyl and propofol History of essential hypertension Appears to be sensitive to Fentanyl Acidosis per ABG Plan Minimize sedation ICU monitoring Off propofol using Precedex Monitor systolic pressure   Iron deficiency Anemia HGB 6.1 Receiving 1 unit PRBC's Recent Labs    01/30/19 0439 01/31/19 0403  HGB 10.2* 7.7*    Plan Transfuse per protocol Note drop in hemoglobin from 10.2-7.7 continue to monitor no overt evidence of bleeding  Acute on Chronic Renal Failure Hyperkalemia>> resolved Renal US>> NO hydro Creatinine 1.33 GFR 40 ml/ min Lab Results  Component Value Date   CREATININE 0.93 01/31/2019  CREATININE 1.23 (H) 01/30/2019   CREATININE 1.33 (H) 02/01/2019   CREATININE 0.7 10/06/2017   Recent Labs  Lab 01/30/19 0404 01/30/19 0439 01/31/19 0403  K 3.8 3.8 4.0    Plan: Creatinine is improving Avoid nephrotoxic Diuresis is been placed on hold consider restarting   Leukocytosis Afebrile Plan Monitor fever  curve and white count Currently not on antibiotics Questionable chest x-ray atelectasis versus infectious process Urine culture 01/28/2019- Currently not on antimicrobial therapy   GI Plan Currently n.p.o. Nutrition consult is been ordered, tube feedings on hold for questionable vomiting rhinorrhea resume tube feedings Bowel regimen  Endocrine CBG (last 3)  Recent Labs    01/30/19 2337 01/31/19 0402 01/31/19 0816  GLUCAP 145* 138* 121*    DM Oral agents at home Plan Sliding scale insulin protocol note CBGs well-controlled  Nonunion supracondylar humerus fracture, right upper extremity Per Ortho Plan Soft splint and sling per orthopedics Monitor neurovascular status of right upper extremity  Best practice:  Diet: NPO Pain/Anxiety/Delirium protocol (if indicated): Propofol and fentanyl VAP protocol (if indicated): NA DVT prophylaxis: SCD's hose  GI prophylaxis:Protonix Glucose control: CBG Q 4 with SSI Mobility: BR Code Status: Full Family Communication: 12/01/2019 Sister updated at bedside Disposition: Neuro ICU  Labs   CBC: Recent Labs  Lab 01/26/19 0337  02/01/2019 0835  02/08/2019 1230  01/19/2019 1800 01/28/2019 2200 01/30/19 0404 01/30/19 0439 01/31/19 0403  WBC 18.3*   < > 9.3  --  12.1*  --  9.2 10.0 9.1  --  8.8  NEUTROABS 16.2*  --  7.6  --  9.8*  --   --   --  7.5  --  7.1  HGB 9.3*   < > 6.1*   < > 7.0*   < > 7.7* 7.9* 7.9* 10.2* 7.7*  HCT 29.7*   < > 20.2*   < > 22.9*   < > 24.5* 25.4* 25.4* 30.0* 24.6*  MCV 97.7   < > 99.5  --  99.1  --  95.7 96.2 97.7  --  96.9  PLT 576*   < > 372  --  469*  --  410* 405* 443*  --  453*   < > = values in this interval not displayed.    Basic Metabolic Panel: Recent Labs  Lab 01/28/19 0352 02/09/2019 0835  02/01/2019 1230 02/08/2019 1346 02/09/2019 1443 02/13/2019 1514 01/30/19 0040 01/30/19 0404 01/30/19 0439 01/31/19 0403  NA 132* 140   < > 135 135  --  135  --  138 138 142  K 4.8 4.0   < > 4.6 4.2  --  4.2   --  3.8 3.8 4.0  CL 97* 105  --  102  --   --   --   --  107  --  112*  CO2 20* 20*  --  22  --   --   --   --  23  --  22  GLUCOSE 147* 134*  --  162*  --   --   --   --  158*  --  151*  BUN 57* 40*  --  42*  --   --   --   --  34*  --  23  CREATININE 2.84* 1.18*  --  1.33*  --   --   --   --  1.23*  --  0.93  CALCIUM 8.4* 7.3*  --  8.1*  --   --   --   --  8.2*  --  8.4*  MG  --   --   --   --   --  2.0  --  2.1 2.1  --  2.0  PHOS  --   --   --   --   --   --   --  3.8 3.5  --  3.6   < > = values in this interval not displayed.   GFR: Estimated Creatinine Clearance: 53.7 mL/min (by C-G formula based on SCr of 0.93 mg/dL). Recent Labs  Lab 01/19/2019 1443 02/13/2019 1800 01/28/2019 2200 01/30/19 0404 01/31/19 0403  WBC  --  9.2 10.0 9.1 8.8  LATICACIDVEN 0.6  --   --  0.6  --     Liver Function Tests: Recent Labs  Lab 01/06/2019 2255  AST 16  ALT 15  ALKPHOS 37*  BILITOT 0.7  PROT 7.0  ALBUMIN 3.1*   No results for input(s): LIPASE, AMYLASE in the last 168 hours. No results for input(s): AMMONIA in the last 168 hours.  ABG    Component Value Date/Time   PHART 7.305 (L) 01/30/2019 0439   PCO2ART 43.4 01/30/2019 0439   PO2ART 81.0 (L) 01/30/2019 0439   HCO3 21.6 01/30/2019 0439   TCO2 23 01/30/2019 0439   ACIDBASEDEF 5.0 (H) 01/30/2019 0439   O2SAT 95.0 01/30/2019 0439     Coagulation Profile: Recent Labs  Lab 02/04/2019 0835 02/07/2019 1230  INR 1.24 1.18    Cardiac Enzymes: No results for input(s): CKTOTAL, CKMB, CKMBINDEX, TROPONINI in the last 168 hours.  HbA1C: Hgb A1c MFr Bld  Date/Time Value Ref Range Status  01/30/2019 04:04 AM 6.1 (H) 4.8 - 5.6 % Final    Comment:    (NOTE) Pre diabetes:          5.7%-6.4% Diabetes:              >6.4% Glycemic control for   <7.0% adults with diabetes   01/19/202020 10:55 PM 6.4 (H) 4.8 - 5.6 % Final    Comment:    (NOTE) Pre diabetes:          5.7%-6.4% Diabetes:              >6.4% Glycemic control for    <7.0% adults with diabetes     CBG: Recent Labs  Lab 01/30/19 1530 01/30/19 1957 01/30/19 2337 01/31/19 0402 01/31/19 0816  GLUCAP 110* 174* 145* 138* 121*     Critical care APP time: 31 minutes      Brett CanalesSteve  ACNP [facility] Pager [phone] till 1 pm If no answer page [phone] 01/31/2019, 9:11 AM

## 2019-01-31 NOTE — Procedures (Signed)
Extubation Procedure Note  Patient Details:   Name: LOVELLA KUSLER DOB: 1946/02/15 MRN: [id number]   Airway Documentation:    Vent end date: 01/31/19 Vent end time: 1525   Evaluation  O2 sats: stable throughout Complications: No apparent complications Patient did tolerate procedure well. Bilateral Breath Sounds: Clear   No   Patient extubated to Bipap per order. PS 10/5. Positive cuff leak. RN at bedside. Vitals are stable. RT will continue to monitor.   Rance Muir 01/31/2019, 3:34 PM

## 2019-01-31 NOTE — Progress Notes (Addendum)
Physical Therapy Re-evaluation Patient Details Name: Angelica [name] MRN: [id number] DOB: [birth year] Today's Date: 01/31/2019    History of Present Illness 73 y.o. female admitted on 01/30/2019 for known nonunion supracondylar R humerus fx (fx from a fall earlier fall 2019 -splinted and non surgical management failed) s/p ORIF on 02/07/2019.  Her daughter noted that around 6:15 AM she was in her usual state of health and shortly after she started noticing a left-sided facial droop, left upper and lower extremity weakness and numbness on the left side.  CTA brain revealed scan positive for severe proximal right M2 stenosis. Right parietal lobe ischemic penumbra without core infarct evident on CTP. Mild bilateral intracranial ICA stenoses. She underwent RT common carotid arteriogram followed by  re-vascularization of occluded branch of the RT MCA sup division achieving a TICI  3 revascularization with reocclusion due to underlying arteriosclerosis.  Pt has been intubated since 01/26/2019 and is still intubated currently.  PMH significant for HTN, DM, CAD, COPD, asthma, foot surgery, back surgery, and L TKA.    PT Comments    Pt admitted with above diagnosis. Pt currently with functional limitations due to the deficits listed below (see PT Problem List). Pt was able to sit EOB x 5 min with max to total assist.  Pt  Met 0/4 goals as pt had a CVA and has been intubated since goals were set.  Re-evaluation completed today and downgraded goals.  Recommend CIR for pt to incr independence. Will follow acutely.  Pt will benefit from skilled PT to increase their independence and safety with mobility to allow discharge to the venue listed below.    Follow Up Recommendations  CIR;Supervision/Assistance - 24 hour     Equipment Recommendations  Other (comment)(TBA)    Recommendations for Other Services       Precautions / Restrictions Precautions Precautions: Fall Precaution Comments: h/o falls Required Braces or  Orthoses: Sling Restrictions Weight Bearing Restrictions: Yes RUE Weight Bearing: Non weight bearing    Mobility  Bed Mobility Overal bed mobility: Needs Assistance Bed Mobility: Supine to Sit     Supine to sit: Total assist;+2 for physical assistance Sit to supine: Total assist;+2 for physical assistance   General bed mobility comments: Pt not initiating movement to EOB.  Used pad to assist.    Transfers                 General transfer comment: unable  Ambulation/Gait                 Stairs             Wheelchair Mobility    Modified Rankin (Stroke Patients Only)       Balance Overall balance assessment: Needs assistance Sitting-balance support: No upper extremity supported;Feet unsupported Sitting balance-Leahy Scale: Zero Sitting balance - Comments: Pt needing max to total assist to sit EOB 5 minutes losing balance to leftand posterior direction.  Was kicking right LE constantly.  Opened eyes to command 2x.                                      Cognition Arousal/Alertness: Lethargic;Suspect due to medications(sedation removed per RN) Behavior During Therapy: Flat affect Overall Cognitive Status: Difficult to assess Area of Impairment: Attention;Safety/judgement;Problem solving                   Current Attention Level: Focused  Safety/Judgement: Decreased awareness of safety;Decreased awareness of deficits   Problem Solving: Slow processing;Difficulty sequencing;Requires verbal cues;Requires tactile cues;Decreased initiation General Comments: pt drowsy throughout session, opening eyes when sitting up but not maintaining- increased time to respond to commands approx 50% of the time (inconsistent)      Exercises General Exercises - Lower Extremity Ankle Circles/Pumps: AROM;Right;10 reps;Seated Long Arc Quad: AROM;Right;15 reps;Seated Other Exercises Other Exercises: elevated UEs on pillows for improved  positoining in bed    General Comments General comments (skin integrity, edema, etc.): friend present on arrival but leaving; confirms plan for 24/7 assist at dc- Pt on 40% fiO2, 5 PEEP, 98% O2 sat; BP initally 140/62 and 141/57 after activity       Pertinent Vitals/Pain Pain Assessment: Faces Faces Pain Scale: No hurt    Home Living Family/patient expects to be discharged to:: Private residence Living Arrangements: Children Available Help at Discharge: Family Type of Home: House Home Access: Stairs to enter   Home Layout: One level Home Equipment: Environmental consultant - 4 wheels;Cane - single point;Bedside commode;Shower seat;Other (comment)(lift chair) Additional Comments: has recently been sleeping in her lift chair    Prior Function Level of Independence: Needs assistance  Gait / Transfers Assistance Needed: generally can get about the house short distances unassisted ADL's / Homemaking Assistance Needed: daughter helps her get into the shower and assists while showering.  Comments: Before fall pt was I in all ADLs until recently requiring assistance with most ADLs for safety and limited function.   PT Goals (current goals can now be found in the care plan section) Acute Rehab PT Goals Patient Stated Goal: none stated PT Goal Formulation: With patient/family Time For Goal Achievement: 02/14/19 Potential to Achieve Goals: Fair Progress towards PT goals: Goals downgraded-see care plan    Frequency    Min 3X/week      PT Plan Discharge plan needs to be updated;Frequency needs to be updated    Co-evaluation PT/OT/SLP Co-Evaluation/Treatment: Yes Reason for Co-Treatment: Complexity of the patient's impairments (multi-system involvement) PT goals addressed during session: Mobility/safety with mobility OT goals addressed during session: ADL's and self-care;Strengthening/ROM      AM-PAC PT "6 Clicks" Mobility   Outcome Measure  Help needed turning from your back to your side while  in a flat bed without using bedrails?: Total Help needed moving from lying on your back to sitting on the side of a flat bed without using bedrails?: Total Help needed moving to and from a bed to a chair (including a wheelchair)?: Total Help needed standing up from a chair using your arms (e.g., wheelchair or bedside chair)?: Total Help needed to walk in hospital room?: Total Help needed climbing 3-5 steps with a railing? : Total 6 Click Score: 6    End of Session Equipment Utilized During Treatment: (on vent) Activity Tolerance: Patient limited by fatigue;Patient limited by lethargy Patient left: with call bell/phone within reach;in bed;with bed alarm set;with SCD's reapplied Nurse Communication: Mobility status;Need for lift equipment PT Visit Diagnosis: Muscle weakness (generalized) (M62.81);Difficulty in walking, not elsewhere classified (R26.2);History of falling (Z91.81);Hemiplegia and hemiparesis;Unsteadiness on feet (R26.81) Hemiplegia - Right/Left: Left Hemiplegia - dominant/non-dominant: Dominant Hemiplegia - caused by: Cerebral infarction Pain - Right/Left: Right Pain - part of body: Arm     Time: 1044-1101 PT Time Calculation (min) (ACUTE ONLY): 17 min  Charges:                 1 PT Re-evaluation       Maricela Kawahara  [facility],PT Acute Rehabilitation Services Pager:  [phone]  Office:  [place] 01/31/2019, 2:27 PM

## 2019-01-31 NOTE — Progress Notes (Signed)
SLP Cancellation Note  Patient Details Name: Angelica [name] MRN: [id number] DOB: [birth year]   Cancelled treatment:       Reason Eval/Treat Not Completed: Medical issues which prohibited therapy. Pt remains intubated; SLP will follow for readiness.    Scheryl Marten 01/31/2019, 3:19 PM

## 2019-01-31 NOTE — Progress Notes (Signed)
STROKE TEAM PROGRESS NOTE   INTERVAL HISTORY No family present. Now on CPAP, weaning underway. Propofol changed to precedex. Awake.  Vitals:   01/31/19 0730 01/31/19 0751 01/31/19 0754 01/31/19 0800  BP: (!) 139/51   (!) 137/53  Pulse: 65   72  Resp: 20   (!) 22  Temp:    98.8 F (37.1 C)  TempSrc:    Axillary  SpO2: 100% 100% 99% 100%  Weight:      Height:        CBC:  Recent Labs  Lab 01/30/19 0404 01/30/19 0439 01/31/19 0403  WBC 9.1  --  8.8  NEUTROABS 7.5  --  7.1  HGB 7.9* 10.2* 7.7*  HCT 25.4* 30.0* 24.6*  MCV 97.7  --  96.9  PLT 443*  --  453*    Basic Metabolic Panel:  Recent Labs  Lab 01/30/19 0404 01/30/19 0439 01/31/19 0403  NA 138 138 142  K 3.8 3.8 4.0  CL 107  --  112*  CO2 23  --  22  GLUCOSE 158*  --  151*  BUN 34*  --  23  CREATININE 1.23*  --  0.93  CALCIUM 8.2*  --  8.4*  MG 2.1  --  2.0  PHOS 3.5  --  3.6   Lipid Panel:     Component Value Date/Time   CHOL 108 01/30/2019 0404   TRIG 264 (H) 01/30/2019 0404   HDL 23 (L) 01/30/2019 0404   CHOLHDL 4.7 01/30/2019 0404   VLDL 53 (H) 01/30/2019 0404   LDLCALC 32 01/30/2019 0404   HgbA1c:  Lab Results  Component Value Date   HGBA1C 6.1 (H) 01/30/2019   Urine Drug Screen: No results found for: LABOPIA, COCAINSCRNUR, LABBENZ, AMPHETMU, THCU, LABBARB  Alcohol Level No results found for: [facility]  IMAGING Mr Brain Wo Contrast  Result Date: 01/30/2019 CLINICAL DATA:  Stroke follow-up EXAM: MRI HEAD WITHOUT CONTRAST TECHNIQUE: Multiplanar, multiecho pulse sequences of the brain and surrounding structures were obtained without intravenous contrast. COMPARISON:  CTA head neck 02/15/2019 FINDINGS: BRAIN: Large infarct of the right posterior MCA territory. There is moderate associated edema. No midline shift or other mass effect. The midline structures are normal.The white matter signal is normal for the patient's age. The cerebral and cerebellar volume are age-appropriate. Susceptibility-sensitive  sequences show no chronic microhemorrhage or superficial siderosis. VASCULAR: Major intracranial arterial and venous sinus flow voids are normal. SKULL AND UPPER CERVICAL SPINE: Calvarial bone marrow signal is normal. There is no skull base mass. Visualized upper cervical spine and soft tissues are normal. SINUSES/ORBITS: No fluid levels or advanced mucosal thickening. No mastoid or middle ear effusion. The orbits are normal. 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 No acute hemorrhage. Electronically Signed   By: Deatra RobinsonKevin  Herman M.D.   On: 01/30/2019 02:43   Dg Chest Port 1 View  Result Date: 01/31/2019 CLINICAL DATA:  Respiratory failure EXAM: PORTABLE CHEST 1 VIEW COMPARISON:  Yesterday FINDINGS: Endotracheal tube tip at the clavicular heads. The orogastric tube reaches the stomach at least. Mild improvement in left lower lobe aeration but still extensive indistinct density at both bases. Trace left pleural fluid. No pneumothorax. Cardiomegaly. IMPRESSION: 1. Stable hardware positioning. 2. Lower lobe opacities with mild left-sided improvement compared yesterday. Electronically Signed   By: Marnee SpringJonathon  Watts M.D.   On: 01/31/2019 08:54   Dg Chest Port 1 View  Result Date: 01/30/2019 CLINICAL DATA:  History of endotracheal tube EXAM: PORTABLE CHEST 1 VIEW COMPARISON:  Two days ago FINDINGS: Endotracheal tube tip at the clavicular heads. The orogastric tube at least reaches the stomach. Low volume chest with hazy opacities at the bases. Probable pleural fluid on the left. IMPRESSION: 1. Unremarkable hardware positioning. 2. Atelectasis or infection at the bases with probable small left effusion. Electronically Signed   By: Marnee SpringJonathon  Watts M.D.   On: 01/30/2019 08:49   Dg Abd Portable 1v  Result Date: 01/23/2019 CLINICAL DATA:  OG tube placement EXAM: PORTABLE ABDOMEN - 1 VIEW COMPARISON:  None. FINDINGS: OG tube tip is in the mid  stomach. Nonobstructed bowel gas pattern. Left lower lobe atelectasis or infiltrate. IMPRESSION: OG tube tip in the mid stomach. Electronically Signed   By: Charlett NoseKevin  Dover M.D.   On: 02/03/2019 22:29   2D Echocardiogram   1. The left ventricle has normal systolic function, with an ejection fraction of 55-60%. The cavity size was normal. Left ventricular diastolic Doppler parameters are consistent with impaired relaxation.  2. The right ventricle has normal systolic function. The cavity was normal. There is no increase in right ventricular wall thickness.  3. The mitral valve is normal in structure.  4. The tricuspid valve is normal in structure.  5. The aortic valve is normal in structure.  6. The aortic root and ascending aorta are normal in size and structure.  7. Right atrial pressure is estimated at 8 mmHg.  8. The interatrial septum was not assessed.   PHYSICAL EXAM General: Awake alert in no distress HEENT: Normocephalic atraumatic dry mucous membranes Lungs: Clear to auscultation Cardiovascular: S1-S2 heard regular rate rhythm   Patient awake Intubated, following commands this morning  Pupils are 3mm and equal and reactive  extraocular movement exam shows inability to gaze to the left.  She cannot cross midline to look to the left.  No visual field cuts based on confrontation.  Complete left lower facial paralysis.  Palate midline.  Tongue midline. Motor exam: Right upper extremity in cast and difficult examined.  2/5 left upper extremity.  3/5 left lower extremity.  5/5 right lower extremity. Sensory exam: grimaces to pain throughout . Coordination: Unable to perform   ASSESSMENT/PLAN Ms. Levie HeritageVickie S Hires is a 73 y.o. 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 In hospital developed confusion, left-sided facial droop, left arm and leg weakness and numbness.  She was not a TPA candidate secondary to recent surgery.  She was taken to IR, with unsuccessful revascularization.  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 ASPECTS 10.     CTA head & neck no LVO.  Severe R M2 stenosis.  Mild bilateral ICA stenosis.  Moderate RVA stenosis.  Aortic atherosclerosis.  CT perfusion R parietal ischemic penumbra without core infarct  MRI  Large acute R MCA infarct w/ mod edema, no shift  2D Echo  EF 55-60%. No source of embolus   LDL 32 on Lovaza  HgbA1c 6.1  no VTE prophylaxis.  Lovenox 40 mg subcu daily added Diet Order            Diet NPO time specified  Diet effective now              No antithrombotic prior to admission, now on No antithrombotic. Given large vessel intracranial atherosclerosis, patient should be treated with aspirin 81 mg and clopidogrel 75 mg orally every day x 3 months for secondary stroke prevention. After 3 months, change to plavix alone.   Therapy recommendations:  pending   Disposition:  pending   Acute respiratory failure  COPD  Intubated for attempted IR   CXR atx w/ prob small L effusion  Weaning underway. Plans for extubation today  PCCM following  Hypertension  Treated with cleviprex post IR, off  PTA: diltiazem 120 q 24h  Now on cardizem 30 q4  BP goal < 160 . Long-term BP goal normotensive  Diabetes type II  HgbA1c 6.1, goal < 7.0  Controlled  Other Stroke Risk Factors  Advanced age  Former Cigarette smoker  Obesity, Body mass index is 35.82 kg/m., recommend weight loss, diet and exercise as appropriate   Coronary artery disease  Other Active Problems  Fe deficiency Anemia, transfused  Hyperkalemia  AKI on  CKD stage II-III. Renal US no hydro. Cr 0.93  COPD  Non union supracondylar humerus fracture, R - stable from surgeon standpoint  Hospital day # 6  Annie Main, MSN, APRN, ANVP-BC, AGPCNP-BC Advanced Practice Stroke Nurse [facility] See Amion for Schedule & Pager information 01/31/2019 9:06 AM   R M2 stenosis, result in moderate size R MCA stroke. Patient is left handed Continue dual antiplatelet therapy based on sampris trial Continue CPAP trials as per ICU team, potential for extubation today Monitor in ICU for edema watch due to size of infract for 24 more hours   DVT ppx with lovenox  To contact Stroke Continuity provider, please refer to [email or web address]. After hours, contact General Neurology

## 2019-01-31 NOTE — Progress Notes (Signed)
Occupational Therapy Re-evalulation Patient Details Name: Angelica [name] MRN: [id number] DOB: [birth year] Today's Date: 01/31/2019    History of present illness 73 y.o. female admitted on 02/01/2019 for known nonunion supracondylar R humerus fx (fx from a fall earlier fall 2019 -splinted and non surgical management failed) s/p ORIF on 02/14/2019.  Her daughter noted that around 6:15 AM she was in her usual state of health and shortly after she started noticing a left-sided facial droop, left upper and lower extremity weakness and numbness on the left side.  CTA brain revealed scan positive for severe proximal right M2 stenosis. Right parietal lobe ischemic penumbra without core infarct evident on CTP. Mild bilateral intracranial ICA stenoses. She underwent RT common carotid arteriogram followed by  re-vascularization of occluded branch of the RT MCA sup division achieving a TICI  3 revascularization with reocclusion due to underlying arteriosclerosis.  Pt has been intubated since 01/28/2019 and is still intubated currently.  PMH significant for HTN, DM, CAD, COPD, asthma, foot surgery, back surgery, and L TKA.   OT comments  Patient re-evaluated, see above for details.  Patient currently intubated and requires total assist +2 for all self care and bed mobility at this time.  L UE appears flaccid (does not withdrawal to pain) and R UE limited due to NWB/recent surgery.  Patient able to sit EOB for 5 minutes with strong L lateral and posterior lean, opens eyes when changing positions but doesn't maintain, and able to follow simple 1 step commands inconsistently.  Goals updated, and at this time recommend CIR level rehab in order to optimize independence with self care and mobility.  Will follow.    Follow Up Recommendations  CIR;Supervision/Assistance - 24 hour    Equipment Recommendations  Other (comment)(TBD at next venue of care)    Recommendations for Other Services Rehab consult    Precautions /  Restrictions Precautions Precautions: Fall Precaution Comments: h/o falls Required Braces or Orthoses: Sling Restrictions Weight Bearing Restrictions: Yes RUE Weight Bearing: Non weight bearing       Mobility Bed Mobility Overal bed mobility: Needs Assistance Bed Mobility: Supine to Sit     Supine to sit: Total assist;+2 for physical assistance Sit to supine: Total assist;+2 for physical assistance   General bed mobility comments: Pt not initiating movement to EOB.  Used pad to assist.    Transfers                 General transfer comment: unable    Balance Overall balance assessment: Needs assistance Sitting-balance support: No upper extremity supported;Feet unsupported Sitting balance-Leahy Scale: Zero Sitting balance - Comments: Pt needing max to total assist to sit EOB 5 minutes losing balance to leftand posterior direction.  Was kicking right LE constantly.  Opened eyes to command 2x.                                     ADL either performed or assessed with clinical judgement   ADL Overall ADL's : Needs assistance/impaired                                     Functional mobility during ADLs: Total assistance;+2 for physical assistance;+2 for safety/equipment General ADL Comments: requires total assist for all self care and bed mobility to EOB at this time; limited by cognition and  alertness      Vision   Additional Comments: further assessment recommended, able to open eyes during session but not maintained   Perception     Praxis      Cognition Arousal/Alertness: Lethargic;Suspect due to medications(sedation removed per RN) Behavior During Therapy: Flat affect Overall Cognitive Status: Difficult to assess Area of Impairment: Attention;Safety/judgement;Problem solving                   Current Attention Level: Focused     Safety/Judgement: Decreased awareness of safety;Decreased awareness of deficits    Problem Solving: Slow processing;Difficulty sequencing;Requires verbal cues;Requires tactile cues;Decreased initiation General Comments: pt drowsy throughout session, opening eyes when sitting up but not maintaining- increased time to respond to commands approx 50% of the time (inconsistent)        Exercises Exercises: General Lower Extremity General Exercises - Lower Extremity Ankle Circles/Pumps: AROM;Right;10 reps;Seated Long Arc Quad: AROM;Right;15 reps;Seated Other Exercises Other Exercises: elevated UEs on pillows for improved positoining in bed   Shoulder Instructions       General Comments friend present on arrival but leaving; confirms plan for 24/7 assist at dc- Pt on 40% fiO2, 5 PEEP, 98% O2 sat; BP initally 140/62 and 141/57 after activity     Pertinent Vitals/ Pain       Pain Assessment: Faces Faces Pain Scale: No hurt  Home Living Family/patient expects to be discharged to:: Private residence Living Arrangements: Children Available Help at Discharge: Family Type of Home: House Home Access: Stairs to enter Secretary/administrator of Steps: 1   Home Layout: One level     Bathroom Shower/Tub: Producer, television/film/video: Standard     Home Equipment: Environmental consultant - 4 wheels;Cane - single point;Bedside commode;Shower seat;Other (comment)(lift chair)   Additional Comments: has recently been sleeping in her lift chair      Prior Functioning/Environment Level of Independence: Needs assistance  Gait / Transfers Assistance Needed: generally can get about the house short distances unassisted ADL's / Homemaking Assistance Needed: daughter helps her get into the shower and assists while showering.    Comments: Before fall pt was I in all ADLs until recently requiring assistance with most ADLs for safety and limited function.   Frequency  Min 2X/week        Progress Toward Goals  OT Goals(current goals can now be found in the care plan section)     Acute  Rehab OT Goals Patient Stated Goal: none stated OT Goal Formulation: Patient unable to participate in goal setting Time For Goal Achievement: 02/14/19 Potential to Achieve Goals: Good  Plan      Co-evaluation    PT/OT/SLP Co-Evaluation/Treatment: Yes Reason for Co-Treatment: Complexity of the patient's impairments (multi-system involvement);For patient/therapist safety;To address functional/ADL transfers   OT goals addressed during session: ADL's and self-care;Strengthening/ROM      AM-PAC OT "6 Clicks" Daily Activity     Outcome Measure   Help from another person eating meals?: Total Help from another person taking care of personal grooming?: Total Help from another person toileting, which includes using toliet, bedpan, or urinal?: Total Help from another person bathing (including washing, rinsing, drying)?: Total Help from another person to put on and taking off regular upper body clothing?: Total Help from another person to put on and taking off regular lower body clothing?: Total 6 Click Score: 6    End of Session Equipment Utilized During Treatment: Other (comment)(sling to R UE, vent)  OT Visit Diagnosis: History of  falling (Z91.81);Pain;Other symptoms and signs involving cognitive function;Hemiplegia and hemiparesis Hemiplegia - Right/Left: Left Hemiplegia - dominant/non-dominant: Dominant Pain - Right/Left: Right Pain - part of body: Arm   Activity Tolerance Patient limited by fatigue   Patient Left in bed;with call bell/phone within reach;with bed alarm set;with SCD's reapplied   Nurse Communication Mobility status;Precautions        Time: [id number] OT Time Calculation (min): 17 min  Charges: OT General Charges $OT Visit: 1 Visit OT Evaluation $OT Re-eval: 1 Re-eval  Chancy Milroyhristie S Samin Milke, OT Acute Rehabilitation Services Pager [phone] Office [phone]     Chancy MilroyChristie S Caedence Snowden 01/31/2019, 1:00 PM

## 2019-02-01 ENCOUNTER — Inpatient Hospital Stay (HOSPITAL_COMMUNITY): Payer: Medicare Other

## 2019-02-01 LAB — CBC
HCT: 25.4 % — ABNORMAL LOW (ref 36.0–46.0)
HEMOGLOBIN: 8 g/dL — AB (ref 12.0–15.0)
MCH: 30.8 pg (ref 26.0–34.0)
MCHC: 31.5 g/dL (ref 30.0–36.0)
MCV: 97.7 fL (ref 80.0–100.0)
Platelets: 425 10*3/uL — ABNORMAL HIGH (ref 150–400)
RBC: 2.6 MIL/uL — ABNORMAL LOW (ref 3.87–5.11)
RDW: 14.7 % (ref 11.5–15.5)
WBC: 8.6 10*3/uL (ref 4.0–10.5)
nRBC: 0 % (ref 0.0–0.2)

## 2019-02-01 LAB — POCT I-STAT 7, (LYTES, BLD GAS, ICA,H+H)
ACID-BASE EXCESS: 1 mmol/L (ref 0.0–2.0)
Acid-Base Excess: 1 mmol/L (ref 0.0–2.0)
Bicarbonate: 27 mmol/L (ref 20.0–28.0)
Bicarbonate: 26.8 mmol/L (ref 20.0–28.0)
Calcium, Ion: 1.36 mmol/L (ref 1.15–1.40)
Calcium, Ion: 1.34 mmol/L (ref 1.15–1.40)
HCT: 23 % — ABNORMAL LOW (ref 36.0–46.0)
HEMATOCRIT: 29 % — AB (ref 36.0–46.0)
Hemoglobin: 7.8 g/dL — ABNORMAL LOW (ref 12.0–15.0)
Hemoglobin: 9.9 g/dL — ABNORMAL LOW (ref 12.0–15.0)
O2 Saturation: 98 %
O2 Saturation: 98 %
POTASSIUM: 3.4 mmol/L — AB (ref 3.5–5.1)
Patient temperature: 98.8
Patient temperature: 98.8
Potassium: 3.4 mmol/L — ABNORMAL LOW (ref 3.5–5.1)
Sodium: 144 mmol/L (ref 135–145)
Sodium: 144 mmol/L (ref 135–145)
TCO2: 28 mmol/L (ref 22–32)
TCO2: 28 mmol/L (ref 22–32)
pCO2 arterial: 48.3 mmHg — ABNORMAL HIGH (ref 32.0–48.0)
pCO2 arterial: 47.8 mmHg (ref 32.0–48.0)
pH, Arterial: 7.357 (ref 7.350–7.450)
pH, Arterial: 7.357 (ref 7.350–7.450)
pO2, Arterial: 117 mmHg — ABNORMAL HIGH (ref 83.0–108.0)
pO2, Arterial: 119 mmHg — ABNORMAL HIGH (ref 83.0–108.0)

## 2019-02-01 LAB — BASIC METABOLIC PANEL
Anion gap: 13 (ref 5–15)
BUN: 20 mg/dL (ref 8–23)
CO2: 22 mmol/L (ref 22–32)
Calcium: 9.5 mg/dL (ref 8.9–10.3)
Chloride: 110 mmol/L (ref 98–111)
Creatinine, Ser: 0.9 mg/dL (ref 0.44–1.00)
GFR calc Af Amer: >60 mL/min (ref >60)
GFR calc non Af Amer: >60 mL/min (ref >60)
GLUCOSE: 102 mg/dL — AB (ref 70–99)
Potassium: 3.7 mmol/L (ref 3.5–5.1)
Sodium: 145 mmol/L (ref 135–145)

## 2019-02-01 LAB — GLUCOSE, CAPILLARY
GLUCOSE-CAPILLARY: 130 mg/dL — AB (ref 70–99)
GLUCOSE-CAPILLARY: 127 mg/dL — AB (ref 70–99)
Glucose-Capillary: 79 mg/dL (ref 70–99)
Glucose-Capillary: 105 mg/dL — ABNORMAL HIGH (ref 70–99)
Glucose-Capillary: 105 mg/dL — ABNORMAL HIGH (ref 70–99)
Glucose-Capillary: 123 mg/dL — ABNORMAL HIGH (ref 70–99)

## 2019-02-01 LAB — MAGNESIUM: Magnesium: 1.7 mg/dL (ref 1.7–2.4)

## 2019-02-01 LAB — PHOSPHORUS: Phosphorus: 3 mg/dL (ref 2.5–4.6)

## 2019-02-01 MED ORDER — ASPIRIN 81 MG PO CHEW
81.0000 mg | CHEWABLE_TABLET | Freq: Every day | ORAL | Status: DC
  Administered 2019-02-02 – 2019-02-06 (×5): 81 mg
  Filled 2019-02-01 (×5): qty 1

## 2019-02-01 MED ORDER — FUROSEMIDE 10 MG/ML IJ SOLN: 20.0000 mg | Freq: Once | INTRAMUSCULAR | Status: DC

## 2019-02-01 MED ORDER — FUROSEMIDE 10 MG/ML IJ SOLN
20.0000 mg | Freq: Once | INTRAMUSCULAR | Status: AC
  Administered 2019-02-01: 20 mg via INTRAVENOUS
  Filled 2019-02-01: qty 2

## 2019-02-01 MED ORDER — ASPIRIN 300 MG RE SUPP
300.0000 mg | Freq: Every day | RECTAL | Status: DC
  Administered 2019-02-01: 300 mg via RECTAL
  Filled 2019-02-01 (×3): qty 1

## 2019-02-01 NOTE — Progress Notes (Signed)
Occupational Therapy Treatment Patient Details Name: Angelica [name] MRN: [id number] DOB: 30-Jul-1946 Today's Date: 02/01/2019    History of present illness 73 y.o. female admitted on 02/13/2019 for known nonunion supracondylar R humerus fx (fx from a fall earlier fall 2019 -splinted and non surgical management faile) s/p ORIF on 02/03/2019.  Her daughter noted that around 6:15 AM she was in her usual state of health and shortly after she started noticing a left-sided facial droop, left upper and lower extremity weakness and numbness on the left side.  CTA brain revealed scan positive for severe proximal right M2 stenosis. Right parietal lobe ischemic penumbra without core infarct evident on CTP. Mild bilateral intracranial ICA stenoses. She underwent RT common carotid arteriogram followed by  re-vascularization of occluded branch of the RT MCA sup division achieving a TICI  3 revascularization with reocclusion due to underlying arteriosclerosis.  Pt has been intubated 01/22/2019-01/31/2019.  PMH significant for HTN, DM, CAD, COPD, asthma, foot surgery, back surgery, and L TKA.   OT comments  This 73 yo female admitted and underwent above with post op CVA. Pt moving RUE well spontaneously within constraints of casting. Pt following one step commands on right side close to 100% of time, but to look left she needs increased time and cues. She will continue to benefit from acute OT with follow up OT on CIR.   Follow Up Recommendations  CIR;Supervision/Assistance - 24 hour    Equipment Recommendations  Other (comment)(TBD next venue)    Recommendations for Other Services Rehab consult    Precautions / Restrictions Precautions Precautions: Fall Precaution Comments: h/o falls Required Braces or Orthoses: Sling(RUE, but doesn't fit well due to splint positioning of elbow and pt moving her arm around) Restrictions Weight Bearing Restrictions: Yes RUE Weight Bearing: Non weight bearing              ADL  either performed or assessed with clinical judgement   ADL Overall ADL's : Needs assistance/impaired       Grooming Details (indicate cue type and reason): When I handed a washcloth do pt in her right hand she brought it towards her face as far as she could within constraints of casting on RUE                                     Vision   Additional Comments: Pt able to track right without issues. Needs increased time to turn head left and even more time to turn eyes to follow where head is turned to left. Decreased time with increased repetitions. Also needed VCs in addtion to tracking object          Cognition Arousal/Alertness: Awake/alert Behavior During Therapy: Flat affect Overall Cognitive Status: Within Functional Limits for tasks assessed Area of Impairment: Following commands;Awareness;Problem solving                       Following Commands: Follows one step commands consistently(with right side and head turns)   Awareness: Intellectual Problem Solving: Difficulty sequencing;Requires verbal cues;Slow processing General Comments: She asked me for water (very low voice) and she asked me if she was going to get better (again very low voice)        Exercises Other Exercises Other Exercises: Pt moving her RUE shoulder and fingers without difficulty. Sling does not fit well due to angle of casting and pt moving arm  about so removed it and left RUE propped up on 2 pillows that RN already there for her. Pt inconsistently keeping arm propped up on pillows (either moving one pillow to her lap or throwing in floor.           Pertinent Vitals/ Pain       Pain Assessment: Faces Faces Pain Scale: No hurt Pain Intervention(s): Monitored during session         Frequency  Min 2X/week        Progress Toward Goals  OT Goals(current goals can now be found in the care plan section)  Progress towards OT goals: Progressing toward goals  ADL  Goals Pt Will Perform Grooming: (NA) Pt Will Perform Upper Body Bathing: (NA) Pt Will Perform Lower Body Bathing: (Pt will be able to sit with no more than Mod A EOB and work on washing thighs with RUE once washcloth handed to her) Pt Will Perform Upper Body Dressing: (pt will be able to sit EOB with no more than Mod A and A with UBD by attempting to put RUE in shirt sleeve) Additional ADL Goal #3: Pt will track left past midline with head and eyes with decreased effort with repetitions and no more than 3 cues each time  Plan Discharge plan remains appropriate       AM-PAC OT "6 Clicks" Daily Activity     Outcome Measure   Help from another person eating meals?: Total Help from another person taking care of personal grooming?: Total Help from another person toileting, which includes using toliet, bedpan, or urinal?: Total Help from another person bathing (including washing, rinsing, drying)?: Total Help from another person to put on and taking off regular upper body clothing?: Total Help from another person to put on and taking off regular lower body clothing?: Total 6 Click Score: 6    End of Session    OT Visit Diagnosis: History of falling (Z91.81);Pain;Other symptoms and signs involving cognitive function;Hemiplegia and hemiparesis Hemiplegia - Right/Left: Left Hemiplegia - caused by: Cerebral infarction   Activity Tolerance Patient tolerated treatment well   Patient Left in bed;with call bell/phone within reach;with bed alarm set   Nurse Communication (left sling off due to pt moving RUE and sling shifting every time she moved arm. Also LUE weeping at hand--positioned for edema control with pillow.  RN aware pt moving pilllows out from under RUE.)        Time: 5102-5852 OT Time Calculation (min): 21 min  Charges: OT General Charges $OT Visit: 1 Visit OT Treatments $Self Care/Home Management : 8-22 mins  Ignacia Palma, OTR/L Acute [employer] Pager  [phone] Office [phone]      Evette Georges 02/01/2019, 4:52 PM

## 2019-02-01 NOTE — Progress Notes (Signed)
Notified Dr. Marlane Mingle of pts increased work of breathing. Order to increase the PC above PEEP from 8 to 12 and to obtain an ABG in 30 min. RT is aware.

## 2019-02-01 NOTE — Progress Notes (Signed)
Nutrition Follow-up  DOCUMENTATION CODES:   Obesity unspecified  INTERVENTION:   If patient passes swallow evaluation will supplement diet as appropriate  If pt fails swallow evaluation recommend Cortrak placement Jevity 1.2 @ 50 ml/hr (1200 ml/day) 30 ml Prostat BID  Provides: 1640 kcal, 96 grams protein, and 973 ml free water.   Recommend bowel regimen as no BM has been documented since 2/6  NUTRITION DIAGNOSIS:   Increased nutrient needs related to post-op healing as evidenced by estimated needs. Ongoing  GOAL:   Patient will meet greater than or equal to 90% of their needs Not met.   MONITOR:   Diet advancement  ASSESSMENT:   Patient with PMH significant for DM, CAD, COPD, HTN, and HLD. Underwent ORIF of humeral fracture on 2/7. On 2/11 was found to have sudden onset left facial droop and left hemiparesis. CTA revealed R MCA.    2/7- ORIF humeral fracture 2/11- revascularization of R MCA 2/13 extubated  Pt discussed during ICU rounds and with RN.  Per RN pt required BiPAP early but is now weaned to nasal cannula. Swallow eval pending  Medications reviewed and include: colace, 2-6 untis every 4 hours novolog, Vit C Labs reviewed Moderate edema noted  Diet Order:   Diet Order            Diet NPO time specified  Diet effective now              EDUCATION NEEDS:   No education needs have been identified at this time  Skin:  Skin Assessment: Skin Integrity Issues: Skin Integrity Issues:: Incisions Incisions: right arm, right groin   Last BM:  2/6  Height:   Ht Readings from Last 1 Encounters:  02/13/2019 '5\' 1"'  (1.549 m)    Weight:   Wt Readings from Last 1 Encounters:  01/23/2019 86 kg    Ideal Body Weight:  47.7 kg  BMI:  Body mass index is 35.82 kg/m.  Estimated Nutritional Needs:   Kcal:  1550-1750  Protein:  80-95 grams  Fluid:  > 1.5 L/day  Maylon Peppers RD, LDN, CNSC [phone] Pager [phone] After Hours Pager

## 2019-02-01 NOTE — Progress Notes (Signed)
eLink Physician-Brief Progress Note Patient Name: Angelica [name] DOB: [birth year] MRN: [id number]   Date of Service  02/01/2019  HPI/Events of Note  Camera: Increased RR. HR 82, MAP 96. Obese. On BiPAP 8 over peep of 4, rate 12. Discussed with bed side RN. Received lasix earlier.extubated earlier in day. CAV, COPD. resp failure. S/p ORIF elbow on 7 th.  eICU Interventions  - Increase BiPAP, 12 over peep of 4. Get ABG in 30 mins Asp precautions.  Re intubate if worsening.      Intervention Category Intermediate Interventions: Respiratory distress - evaluation and management  Ranee Gosselin 02/01/2019, 12:00 AM

## 2019-02-01 NOTE — Progress Notes (Signed)
STROKE TEAM PROGRESS NOTE   INTERVAL HISTORY No family present. Now on CPAP, weaning underway. Propofol changed to precedex. Awake And following commands but remains with dense right hemiplegia.  Vitals:   02/01/19 1300 02/01/19 1326 02/01/19 1338 02/01/19 1400  BP: (!) 142/58   (!) 137/59  Pulse: 81  83 91  Resp: 14  16 19   Temp:      TempSrc:      SpO2: 99% 99% 99% 99%  Weight:      Height:        CBC:  Recent Labs  Lab 01/30/19 0404  01/31/19 0403 02/01/19 0044 02/01/19 0050  WBC 9.1  --  8.8  --   --   NEUTROABS 7.5  --  7.1  --   --   HGB 7.9*   < > 7.7* 7.8* 9.9*  HCT 25.4*   < > 24.6* 23.0* 29.0*  MCV 97.7  --  96.9  --   --   PLT 443*  --  453*  --   --    < > = values in this interval not displayed.    Basic Metabolic Panel:  Recent Labs  Lab 01/31/19 0403 01/31/19 1650  02/01/19 0050 02/01/19 0656  NA 142  --    < > 144 145  K 4.0  --    < > 3.4* 3.7  CL 112*  --   --   --  110  CO2 22  --   --   --  22  GLUCOSE 151*  --   --   --  102*  BUN 23  --   --   --  20  CREATININE 0.93  --   --   --  0.90  CALCIUM 8.4*  --   --   --  9.5  MG 2.0 1.9  --   --  1.7  PHOS 3.6 3.3  --   --  3.0   < > = values in this interval not displayed.   Lipid Panel:     Component Value Date/Time   CHOL 108 01/30/2019 0404   TRIG 264 (H) 01/30/2019 0404   HDL 23 (L) 01/30/2019 0404   CHOLHDL 4.7 01/30/2019 0404   VLDL 53 (H) 01/30/2019 0404   LDLCALC 32 01/30/2019 0404   HgbA1c:  Lab Results  Component Value Date   HGBA1C 6.1 (H) 01/30/2019   Urine Drug Screen: No results found for: LABOPIA, COCAINSCRNUR, LABBENZ, AMPHETMU, THCU, LABBARB  Alcohol Level No results found for: [facility]  IMAGING Dg Chest Port 1 View  Result Date: 02/01/2019 CLINICAL DATA:  Respiratory failure EXAM: PORTABLE CHEST 1 VIEW COMPARISON:  Yesterday FINDINGS: Tracheal and esophageal extubation. Low volume chest with indistinct opacities at the bases, stable. No pneumothorax. Cardiomegaly.  IMPRESSION: Extubation with stable low volume chest and basilar opacity. Electronically Signed   By: Marnee Spring M.D.   On: 02/01/2019 08:47   Dg Chest Port 1 View  Result Date: 01/31/2019 CLINICAL DATA:  Respiratory failure EXAM: PORTABLE CHEST 1 VIEW COMPARISON:  Yesterday FINDINGS: Endotracheal tube tip at the clavicular heads. The orogastric tube reaches the stomach at least. Mild improvement in left lower lobe aeration but still extensive indistinct density at both bases. Trace left pleural fluid. No pneumothorax. Cardiomegaly. IMPRESSION: 1. Stable hardware positioning. 2. Lower lobe opacities with mild left-sided improvement compared yesterday. Electronically Signed   By: Marnee Spring M.D.   On: 01/31/2019 08:54   2D Echocardiogram  1. The left ventricle has normal systolic function, with an ejection fraction of 55-60%. The cavity size was normal. Left ventricular diastolic Doppler parameters are consistent with impaired relaxation.  2. The right ventricle has normal systolic function. The cavity was normal. There is no increase in right ventricular wall thickness.  3. The mitral valve is normal in structure.  4. The tricuspid valve is normal in structure.  5. The aortic valve is normal in structure.  6. The aortic root and ascending aorta are normal in size and structure.  7. Right atrial pressure is estimated at 8 mmHg.  8. The interatrial septum was not assessed.   PHYSICAL EXAM General: Awake alert in no distress HEENT: Normocephalic atraumatic dry mucous membranes Lungs: Clear to auscultation Cardiovascular: S1-S2 heard regular rate rhythm   Patient awake On BiPAP following commands this morning  Pupils are 3mm and equal and reactive  extraocular movement exam shows inability to gaze to the left.  She cannot cross midline to look to the left.  No visual field cuts based on confrontation.  Complete left lower facial paralysis.  Palate midline.  Tongue midline. Motor  exam: Right upper extremity in cast and difficult examined.  left hemiplegia with 1-2/5 strength.  5/5 right lower extremity. Sensory exam: grimaces to pain throughout . Coordination: Unable to perform   ASSESSMENT/PLAN [name] is a 73 y.o. 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 In hospital developed confusion, left-sided facial droop, left arm and leg weakness and numbness.  She was not a TPA candidate secondary to recent surgery.  She was taken to IR, with unsuccessful revascularization.  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 ASPECTS 10.     CTA head & neck no LVO.  Severe R M2 stenosis.  Mild bilateral ICA stenosis.  Moderate RVA stenosis.  Aortic atherosclerosis.  CT perfusion R parietal ischemic penumbra without core infarct  MRI  Large acute R MCA infarct w/ mod edema, no shift  2D Echo  EF 55-60%. No source of embolus   LDL 32 on Lovaza  HgbA1c 6.1  no VTE prophylaxis.  Lovenox 40 mg subcu daily added Diet Order            Diet NPO time specified  Diet effective now              No antithrombotic prior to admission, now on No antithrombotic. Given large vessel intracranial atherosclerosis, patient should be treated with aspirin 81 mg and clopidogrel 75 mg orally every day x 3 months for secondary stroke prevention. After 3 months, change to plavix alone.   Therapy recommendations:  pending   Disposition:  pending   Acute respiratory failure  COPD  Intubated for attempted IR   CXR atx w/ prob small L effusion  Weaning underway. Plans for extubation  today  PCCM following  Hypertension  Treated with cleviprex post IR, off  PTA: diltiazem 120 q 24h  Now on cardizem 30 q4  BP goal < 160 . 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 Renal US no hydro. Cr 0.93  COPD  Non union supracondylar humerus fracture, R - stable from surgeon standpoint  Hospital day # 7  continue dual antiplatelet therapy. Wean off BiPAP. Check swallow eval been able to swallow. Mobilize out of bed. Therapy and rehabilitation consults. No family available at the bedside for discussion. Discussed with Dr. Everardo All critical care medicine. This patient is critically ill and at significant risk of neurological worsening, death and care requires constant monitoring of vital signs, hemodynamics,respiratory and cardiac monitoring, extensive review of multiple databases, frequent neurological assessment, discussion with family, other specialists and medical decision making of high complexity.I have made any additions or clarifications directly to the above note.This critical care time does not reflect procedure time, or teaching time or supervisory time of PA/NP/Med Resident etc but could involve care discussion time.  I spent 30 minutes of neurocritical care time  in the care of  this patient.     Delia Heady, MSN, APRN, ANVP-BC, AGPCNP-BC Advanced Practice Stroke Nurse [facility] See Amion for Schedule & Pager information 02/01/2019 2:26 PM      To contact Stroke Continuity provider, please refer to [email or web address]. After hours, contact General Neurology

## 2019-02-01 NOTE — Progress Notes (Signed)
SLP Cancellation Note  Patient Details Name: Angelica [name] MRN: [id number] DOB: [birth year]   Cancelled treatment:       Reason Eval/Treat Not Completed: Medical issues which prohibited therapy. Currently on BiPAP and desats when RN removes during mouth care. Will follow.    Royce Macadamia 02/01/2019, 8:48 AM   Breck Coons Lonell Face.Ed Nurse, children's [phone] Office [phone]

## 2019-02-01 NOTE — Progress Notes (Signed)
NAME:  Angelica [name], MRN:  [id number], DOB:  [birth year], LOS: 7 ADMISSION DATE:  2019/02/21, CONSULTATION DATE:  01/30/2019 REFERRING MD:  Wilford Corner CHIEF COMPLAINT:   Brief History   73 year old female past medical history of tobacco abuse, 30 pack years, no quit date, diabetes, coronary disease, COPD, hypertension and hyperlipidemia who recently underwent open reduction internal fixation of the right upper extremity humeral fracture, with sudden onset of left facial droop, dysarthria and left hemiparesis . CTA brain revealed scan positive for severe proximal right M2 stenosis. Right parietal lobe ischemic penumbra without core infarct evident on CTP. Mild bilateral intracranial ICA stenoses. She underwent RT common carotid arteriogram followed by  re-vascularization of occluded branch of the RT MCA sup division achieving a TICI  3 revascularization with reocclusion due to underlying arteriosclerosis.PCCM have been consulted for vent management in patient with known history of COPD.Marland Kitchen History of present illness   History obtained from medical record and family, as patient is intubated and sedated.  73 year old female past medical history of tobacco abuse, 30 pack years, no quit date, diabetes, coronary disease, COPD, hypertension and hyperlipidemia who recently underwent open reduction internal fixation of the right upper extremity humeral fracture, with sudden onset of left facial droop, dysarthria and left hemiparesis with right gaze deviation with a last known normal at 6:15 AM 01/28/2019. CTA head and neck with severe right M2 stenosis versus complete occlusion. 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 No TPA due to recent surgery with plates in place. Due to the proximal vascular stenosis versus occlusion, case discussed with endovascular, Dr. Corliss Skains as well as patient's daughter and patient taken for a diagnostic cerebral angiogram with the intent to perform angioplasty versus  thrombectomy as deemed appropriate after the diagnostic angiogram.She underwent  RT common carotid arteriogram followed by revascularization of occluded branch of the RT MCA sup division achieving a TICI  3 revascularization with reocclusion due to underlying arteriosclerosis.Marland Kitchen Rescue stenting not performed due the small diameter of the the involved branch,and potential increased risk of ICH with adjuvant dual antiplatelets. Patients HG 6.1 on 2/11 She was intubated by anesthesia due to her history of COPD. PCCM have been asked to consult for vent management with goal of extubation.  Past Medical History    Past Medical History:  Diagnosis Date  . Arthritis   . Asthma   . COPD (chronic obstructive pulmonary disease) (HCC)   . Coronary artery disease   . Diabetes mellitus   . Hypertension    Family History Family History  Problem Relation Age of Onset  . Pneumonia Mother   . Lupus Mother   . Lung cancer Father     Significant Hospital Events   2019-02-21  Admission for open reduction internal fixation of the right upper extremity humeral fracture  02/10/2019 Developed facial droop, Middle cerebral artery embolism, right   02/13/2019 RT common carotid arteriogram followed by revascularization of occluded branch of the RT MCA sup division achieving a TICI  3 revascularization with reocclusion due to underlying arteriosclerosis .Marland Kitchen  Consults:  02/15/2019 PCCM  Procedures:  02/13/2019 S/P RT common carotid arteriogram followed by revascularization of occluded branch of the RT MCA sup division achieving a TICI  3 revascularization with reocclusion due to underlying arteriosclerosis.Marland Kitchen Rescue stenting not performed due the small diameter of the the involved branch,and potential increased risk of ICH with adjuvant dual antiplatelets. Patients HG 6.1 on 2/11  Significant Diagnostic Tests:   CT Angio  Head> 01/28/2019 Negative for large vessel occlusion but positive for severe proximal right  M2 stenosis. Right parietal lobe ischemic penumbra without core infarct evident on CTP. Mild bilateral intracranial ICA stenoses. No cervical carotid artery stenosis. Moderate right vertebral artery origin stenosis. Aortic Atherosclerosis (ICD10-I70.0).  Post Procedure Ct Brain  02/12/2019 No ICH or mass effect.  MRI brain is noted  01/30/2019 chest x-ray reviewed reveals bilateral atelectasis. Micro Data:  02/03/2019>> MRSA PCR>> Negative 01/28/2019>> Urine Culture>> negative  Antimicrobials:  None   Interim history/subjective:  Extubated to BiPAP and tolerated overnight. Follows commands.  Objective   Blood pressure (!) 158/75, pulse 90, temperature 98.9 F (37.2 C), temperature source Axillary, resp. rate 15, height 5\' 1"  (1.549 m), weight 86 kg, SpO2 99 %.    Vent Mode: BIPAP;PCV FiO2 (%):  [30 %-40 %] 30 % Set Rate:  [12 bmp] 12 bmp PEEP:  [4 cmH20-5 cmH20] 4 cmH20 Pressure Support:  [10 cmH20] 10 cmH20   Intake/Output Summary (Last 24 hours) at 02/01/2019 1258 Last data filed at 02/01/2019 0900 Gross per 24 hour  Intake 52.92 ml  Output 2401 ml  Net -2348.08 ml   Filed Weights   Feb 18, 2019 1139  Weight: 86 kg   Physical Exam: General: MO, chronically appearing female, no acute distress HENT: [place], AT, wearing BiPAP Eyes: EOMI, no scleral icterus Respiratory: Mild rhonchi bilaterally, difficult to auscultate basilar breath sounds Cardiovascular: RRR, -M/R/G, no JVD GI: BS+, soft, nontender Extremities:RUE sling, RUE edema, PPP Neuro: Awake, alert and following commands, left hemiparesis Skin: Intact, no rashes or bruising GU: External foley in place  Resolved Hospital Problem list   Hypotension secondary to sedation Acute renal failure  Assessment & Plan:    Acute on Chronic Respiratory Failure s/p revascularization of occluded branch of the RT MCA sup division  Presumed COPD (no PFTs on file) Plan -Nebulizers -BiPAP hs -Diuresis as tolerated  Large  right MCA stroke s/p revascularization  -Neuro following: DAPT -Failed swallow evaluation -Cortrak ordered for enteral access  Hypertension Off cleviprex gtt  Iron Deficiency Anemia S/p 1 U PRBC, no acute bleeding -CBC daily  NIDDM -SSI  Nonunion supracondylar humerus fracture, right upper extremity Per Ortho Plan Soft splint and sling per orthopedics Monitor neurovascular status of right upper extremity  Best practice:  Diet: NPO, swallow evalution Pain/Anxiety/Delirium protocol (if indicated): N/A VAP protocol (if indicated): N/A DVT prophylaxis: SCD's hose  GI prophylaxis:Protonix Glucose control: CBG Q 4 with SSI Mobility: BR Code Status: Full Family Communication: No family at bedside Disposition: Remain in ICU.  Labs   CBC: Recent Labs  Lab 01/26/19 0337  01/27/2019 0835  01/20/2019 1230  02/04/2019 1800 02/02/2019 2200 01/30/19 0404 01/30/19 0439 01/31/19 0403 02/01/19 0044 02/01/19 0050  WBC 18.3*   < > 9.3  --  12.1*  --  9.2 10.0 9.1  --  8.8  --   --   NEUTROABS 16.2*  --  7.6  --  9.8*  --   --   --  7.5  --  7.1  --   --   HGB 9.3*   < > 6.1*   < > 7.0*   < > 7.7* 7.9* 7.9* 10.2* 7.7* 7.8* 9.9*  HCT 29.7*   < > 20.2*   < > 22.9*   < > 24.5* 25.4* 25.4* 30.0* 24.6* 23.0* 29.0*  MCV 97.7   < > 99.5  --  99.1  --  95.7 96.2 97.7  --  96.9  --   --  PLT 576*   < > 372  --  469*  --  410* 405* 443*  --  453*  --   --    < > = values in this interval not displayed.    Basic Metabolic Panel: Recent Labs  Lab October 18, 2019 0835  October 18, 2019 1230  01/30/19 0040 01/30/19 0404 01/30/19 0439 01/31/19 0403 01/31/19 1650 02/01/19 0044 02/01/19 0050 02/01/19 0656  NA 140   < > 135   < >  --  138 138 142  --  144 144 145  K 4.0   < > 4.6   < >  --  3.8 3.8 4.0  --  3.4* 3.4* 3.7  CL 105  --  102  --   --  107  --  112*  --   --   --  110  CO2 20*  --  22  --   --  23  --  22  --   --   --  22  GLUCOSE 134*  --  162*  --   --  158*  --  151*  --   --   --  102*    BUN 40*  --  42*  --   --  34*  --  23  --   --   --  20  CREATININE 1.18*  --  1.33*  --   --  1.23*  --  0.93  --   --   --  0.90  CALCIUM 7.3*  --  8.1*  --   --  8.2*  --  8.4*  --   --   --  9.5  MG  --   --   --    < > 2.1 2.1  --  2.0 1.9  --   --  1.7  PHOS  --   --   --   --  3.8 3.5  --  3.6 3.3  --   --  3.0   < > = values in this interval not displayed.   GFR: Estimated Creatinine Clearance: 55.5 mL/min (by C-G formula based on SCr of 0.9 mg/dL). Recent Labs  Lab October 18, 2019 1443 October 18, 2019 1800 October 18, 2019 2200 01/30/19 0404 01/31/19 0403  WBC  --  9.2 10.0 9.1 8.8  LATICACIDVEN 0.6  --   --  0.6  --     Liver Function Tests: Recent Labs  Lab 01/31/2019 2255  AST 16  ALT 15  ALKPHOS 37*  BILITOT 0.7  PROT 7.0  ALBUMIN 3.1*   No results for input(s): LIPASE, AMYLASE in the last 168 hours. No results for input(s): AMMONIA in the last 168 hours.  ABG    Component Value Date/Time   PHART 7.357 02/01/2019 0050   PCO2ART 47.8 02/01/2019 0050   PO2ART 119.0 (H) 02/01/2019 0050   HCO3 26.8 02/01/2019 0050   TCO2 28 02/01/2019 0050   ACIDBASEDEF 5.0 (H) 01/30/2019 0439   O2SAT 98.0 02/01/2019 0050     Coagulation Profile: Recent Labs  Lab October 18, 2019 0835 October 18, 2019 1230  INR 1.24 1.18    Cardiac Enzymes: No results for input(s): CKTOTAL, CKMB, CKMBINDEX, TROPONINI in the last 168 hours.  HbA1C: Hgb A1c MFr Bld  Date/Time Value Ref Range Status  01/30/2019 04:04 AM 6.1 (H) 4.8 - 5.6 % Final    Comment:    (NOTE) Pre diabetes:          5.7%-6.4% Diabetes:              >  6.4% Glycemic control for   <7.0% adults with diabetes   01/30/2019 10:55 PM 6.4 (H) 4.8 - 5.6 % Final    Comment:    (NOTE) Pre diabetes:          5.7%-6.4% Diabetes:              >6.4% Glycemic control for   <7.0% adults with diabetes     CBG: Recent Labs  Lab 01/31/19 1934 01/31/19 2326 02/01/19 0337 02/01/19 0821 02/01/19 1156  GLUCAP 123* 71 79 105* 130*     The  patient is critically ill with multiple organ systems failure and requires high complexity decision making for assessment and support, frequent evaluation and titration of therapies, application of advanced monitoring technologies and extensive interpretation of multiple databases.   Critical Care Time devoted to patient care services described in this note is 33 Minutes. This time reflects time of care of this signee Dr. Mechele CollinJane Kaysha Parsell. This critical care time does not reflect procedure time, or teaching time or supervisory time of PA/NP/Med student/Med Resident etc but could involve care discussion time.  Mechele CollinJane Raykwon Hobbs, M.D. [facility] Pulmonary/Critical Care Medicine Pager: [phone] After hours pager: [phone]

## 2019-02-01 NOTE — Progress Notes (Signed)
eLink Physician-Brief Progress Note Patient Name: Angelica [name] DOB: [birth year] MRN: [id number]   Date of Service  02/01/2019  HPI/Events of Note  Lungs sound congested as per bed side RN. On BID lasix. Obese. ABG on BiPAP improved.  eICU Interventions  Lasix 20 mg IV once Has CxR, labs pending for AM. Replace K if low.      Intervention Category Intermediate Interventions: Respiratory distress - evaluation and management;Diagnostic test evaluation  Ranee Gosselin 02/01/2019, 4:16 AM

## 2019-02-01 NOTE — Evaluation (Signed)
Clinical/Bedside Swallow Evaluation Patient Details  Name: Angelica [name] MRN: [id number] Date of Birth: 18-Jan-1946  Today's Date: 02/01/2019 Time: SLP Start Time (ACUTE ONLY): 1610 SLP Stop Time (ACUTE ONLY): 1617 SLP Time Calculation (min) (ACUTE ONLY): 7 min  Past Medical History:  Past Medical History:  Diagnosis Date  . Arthritis   . Asthma   . COPD (chronic obstructive pulmonary disease) (HCC)   . Coronary artery disease   . Diabetes mellitus   . Hypertension    Past Surgical History:  Past Surgical History:  Procedure Laterality Date  . ABDOMINAL HYSTERECTOMY    . BACK SURGERY    . CARDIAC CATHETERIZATION  2012   Mild 2V CAD 08/01/01 LHC  . CESAREAN SECTION    . CHOLECYSTECTOMY    . FOOT SURGERY    . IR ANGIO VERTEBRAL SEL SUBCLAVIAN INNOMINATE UNI R MOD SED  02/14/2019  . IR CT HEAD LTD  01/22/2019  . IR PERCUTANEOUS ART THROMBECTOMY/INFUSION INTRACRANIAL INC DIAG ANGIO  02/12/2019  . ORIF ELBOW FRACTURE Right 24-Feb-2019   Procedure: Right elbow open reduction internal fixation with olecranon osteotomy and ulnar nerve decompression;  Surgeon: Dominica Severin, MD;  Location: [facility] OR;  Service: Orthopedics;  Laterality: Right;   . RADIOLOGY WITH ANESTHESIA N/A 02/08/2019   Procedure: IR WITH ANESTHESIA;  Surgeon: Julieanne Cotton, MD;  Location: [facility] OR;  Service: Radiology;  Laterality: N/A;  . TONSILLECTOMY    . TOTAL KNEE ARTHROPLASTY    . TUBAL LIGATION     HPI:  73 y.o. female admitted on 02-24-19 for known nonunion supracondylar R humerus fx (fx from a fall earlier fall 2019 -splinted and non surgical management failed) s/p ORIF on 2019-02-24. Intubated 2/11- 2/14 in am. Per chart daughter noted left-sided facial droop, left upper and lower extremity weakness and numbness on the left side.   CTA brain revealed scan positive for severe proximal right M2 stenosis. Right parietal lobe ischemic penumbra without core infarct evident on CTP. Mild bilateral intracranial ICA  stenoses. She underwent RT common carotid arteriogram followed by  re-vascularization of occluded branch of the RT MCA sup division achieving a TICI  3 revascularization with reocclusion due to underlying arteriosclerosis. PMH significant for HTN, DM, CAD, COPD, asthma, foot surgery, back surgery, and L TKA.   Assessment / Plan / Recommendation Clinical Impression  Pt is presently unsafe for food/liquid. Vocal quality is hoarse and barely audible; volitional cough attempt was a weak throat clear. She is groggy with reduced attention and awareness. Immediate cough following water. Suspect airway compromise following intubation. Prognosis is good for improvement and therapist will continue to see for appropriateness for instrumental swallow assessent. Continue NPO and oral care.    SLP Visit Diagnosis: Dysphagia, unspecified (R13.10)    Aspiration Risk  Severe aspiration risk    Diet Recommendation NPO        Other  Recommendations Oral Care Recommendations: Oral care QID   Follow up Recommendations (TBD)      Frequency and Duration min 2x/week  2 weeks       Prognosis Prognosis for Safe Diet Advancement: Good Barriers to Reach Goals: Cognitive deficits      Swallow Study   General HPI: 73 y.o. female admitted on 02-24-19 for known nonunion supracondylar R humerus fx (fx from a fall earlier fall 2019 -splinted and non surgical management failed) s/p ORIF on 2019-02-24. Intubated 2/11- 2/14 in am. Per chart daughter noted left-sided facial droop, left upper and lower extremity weakness  and numbness on the left side.   CTA brain revealed scan positive for severe proximal right M2 stenosis. Right parietal lobe ischemic penumbra without core infarct evident on CTP. Mild bilateral intracranial ICA stenoses. She underwent RT common carotid arteriogram followed by  re-vascularization of occluded branch of the RT MCA sup division achieving a TICI  3 revascularization with reocclusion due to underlying  arteriosclerosis. PMH significant for HTN, DM, CAD, COPD, asthma, foot surgery, back surgery, and L TKA. Type of Study: Bedside Swallow Evaluation Previous Swallow Assessment: none Diet Prior to this Study: NPO Temperature Spikes Noted: Yes Respiratory Status: Nasal cannula History of Recent Intubation: Yes Length of Intubations (days): 3 days Date extubated: 02/01/19 Behavior/Cognition: Alert;Requires cueing;Distractible Oral Cavity Assessment: Dry;Dried secretions Oral Care Completed by SLP: (cannot completely view) Oral Cavity - Dentition: Adequate natural dentition Vision: Impaired for self-feeding Self-Feeding Abilities: Needs assist;Needs set up Patient Positioning: Upright in bed Baseline Vocal Quality: Low vocal intensity;Hoarse Volitional Cough: Weak Volitional Swallow: Unable to elicit    Oral/Motor/Sensory Function Overall Oral Motor/Sensory Function: Other (comment)(difficulty following commands)   Ice Chips Ice chips: Not tested   Thin Liquid Thin Liquid: Impaired Presentation: Cup Oral Phase Impairments: Reduced labial seal Oral Phase Functional Implications: Right anterior spillage;Left anterior spillage Pharyngeal  Phase Impairments: Cough - Immediate    Nectar Thick Nectar Thick Liquid: Not tested   Honey Thick Honey Thick Liquid: Not tested   Puree Puree: Impaired Pharyngeal Phase Impairments: Wet Vocal Quality   Solid     Solid: Not tested      Royce Macadamia 02/01/2019,4:33 PM  Breck Coons Lonell Face.Ed Nurse, children's [phone] Office [phone]

## 2019-02-01 NOTE — Progress Notes (Signed)
Patient off Bipap at this time and on 4L [place]. Patient tolerating well. Vitals are stable. Will continue to monitor.

## 2019-02-02 DIAGNOSIS — J9621 Acute and chronic respiratory failure with hypoxia: Secondary | ICD-10-CM

## 2019-02-02 LAB — BASIC METABOLIC PANEL
Anion gap: 14 (ref 5–15)
BUN: 22 mg/dL (ref 8–23)
CO2: 23 mmol/L (ref 22–32)
Calcium: 10.2 mg/dL (ref 8.9–10.3)
Chloride: 109 mmol/L (ref 98–111)
Creatinine, Ser: 0.96 mg/dL (ref 0.44–1.00)
GFR calc Af Amer: >60 mL/min (ref >60)
GFR calc non Af Amer: 59 mL/min — ABNORMAL LOW (ref >60)
Glucose, Bld: 156 mg/dL — ABNORMAL HIGH (ref 70–99)
POTASSIUM: 3.4 mmol/L — AB (ref 3.5–5.1)
SODIUM: 146 mmol/L — AB (ref 135–145)

## 2019-02-02 LAB — CBC
HCT: 26.6 % — ABNORMAL LOW (ref 36.0–46.0)
Hemoglobin: 7.8 g/dL — ABNORMAL LOW (ref 12.0–15.0)
MCH: 29.4 pg (ref 26.0–34.0)
MCHC: 29.3 g/dL — ABNORMAL LOW (ref 30.0–36.0)
MCV: 100.4 fL — ABNORMAL HIGH (ref 80.0–100.0)
Platelets: 542 10*3/uL — ABNORMAL HIGH (ref 150–400)
RBC: 2.65 MIL/uL — ABNORMAL LOW (ref 3.87–5.11)
RDW: 14.6 % (ref 11.5–15.5)
WBC: 8.2 10*3/uL (ref 4.0–10.5)
nRBC: 0 % (ref 0.0–0.2)

## 2019-02-02 LAB — GLUCOSE, CAPILLARY
GLUCOSE-CAPILLARY: 168 mg/dL — AB (ref 70–99)
GLUCOSE-CAPILLARY: 214 mg/dL — AB (ref 70–99)
Glucose-Capillary: 113 mg/dL — ABNORMAL HIGH (ref 70–99)
Glucose-Capillary: 123 mg/dL — ABNORMAL HIGH (ref 70–99)
Glucose-Capillary: 141 mg/dL — ABNORMAL HIGH (ref 70–99)
Glucose-Capillary: 147 mg/dL — ABNORMAL HIGH (ref 70–99)

## 2019-02-02 LAB — MAGNESIUM
MAGNESIUM: 1.7 mg/dL (ref 1.7–2.4)
Magnesium: 1.8 mg/dL (ref 1.7–2.4)

## 2019-02-02 LAB — PHOSPHORUS
PHOSPHORUS: 2.3 mg/dL — AB (ref 2.5–4.6)
Phosphorus: 2.6 mg/dL (ref 2.5–4.6)

## 2019-02-02 MED ORDER — VITAL HIGH PROTEIN PO LIQD: 1000.0000 mL | ORAL | Status: DC

## 2019-02-02 MED ORDER — INSULIN ASPART 100 UNIT/ML ~~LOC~~ SOLN
0.0000 [IU] | SUBCUTANEOUS | Status: DC
  Administered 2019-02-02: 2 [IU] via SUBCUTANEOUS
  Administered 2019-02-02: 3 [IU] via SUBCUTANEOUS
  Administered 2019-02-02: 1 [IU] via SUBCUTANEOUS
  Administered 2019-02-03: 3 [IU] via SUBCUTANEOUS
  Administered 2019-02-03: 1 [IU] via SUBCUTANEOUS
  Administered 2019-02-03: 2 [IU] via SUBCUTANEOUS
  Administered 2019-02-03 (×3): 1 [IU] via SUBCUTANEOUS
  Administered 2019-02-04: 5 [IU] via SUBCUTANEOUS
  Administered 2019-02-04 (×2): 3 [IU] via SUBCUTANEOUS
  Administered 2019-02-04: 5 [IU] via SUBCUTANEOUS
  Administered 2019-02-04: 2 [IU] via SUBCUTANEOUS
  Administered 2019-02-05 (×3): 7 [IU] via SUBCUTANEOUS
  Administered 2019-02-05: 3 [IU] via SUBCUTANEOUS
  Administered 2019-02-05: 7 [IU] via SUBCUTANEOUS
  Administered 2019-02-05 – 2019-02-06 (×2): 5 [IU] via SUBCUTANEOUS
  Administered 2019-02-06: 2 [IU] via SUBCUTANEOUS
  Administered 2019-02-06: 7 [IU] via SUBCUTANEOUS

## 2019-02-02 MED ORDER — JEVITY 1.2 CAL PO LIQD
1000.0000 mL | ORAL | Status: DC
  Administered 2019-02-02 – 2019-02-04 (×3): 1000 mL
  Filled 2019-02-02 (×7): qty 1000

## 2019-02-02 MED ORDER — FREE WATER
100.0000 mL | Freq: Three times a day (TID) | Status: DC
  Administered 2019-02-02 – 2019-02-03 (×4): 100 mL

## 2019-02-02 MED ORDER — SODIUM CHLORIDE 0.9 % IV SOLN
INTRAVENOUS | Status: DC
  Administered 2019-02-02: 11:00:00 via INTRAVENOUS

## 2019-02-02 MED ORDER — PRO-STAT SUGAR FREE PO LIQD
30.0000 mL | Freq: Two times a day (BID) | ORAL | Status: DC
  Administered 2019-02-02 – 2019-02-06 (×9): 30 mL
  Filled 2019-02-02 (×8): qty 30

## 2019-02-02 MED ORDER — ORAL CARE MOUTH RINSE
15.0000 mL | Freq: Two times a day (BID) | OROMUCOSAL | Status: DC
  Administered 2019-02-02 – 2019-02-04 (×5): 15 mL via OROMUCOSAL

## 2019-02-02 MED ORDER — CHLORHEXIDINE GLUCONATE 0.12 % MT SOLN
15.0000 mL | Freq: Two times a day (BID) | OROMUCOSAL | Status: DC
  Administered 2019-02-02 – 2019-02-04 (×5): 15 mL via OROMUCOSAL
  Filled 2019-02-02 (×4): qty 15

## 2019-02-02 MED ORDER — BUDESONIDE 0.5 MG/2ML IN SUSP
0.5000 mg | Freq: Two times a day (BID) | RESPIRATORY_TRACT | Status: DC
  Administered 2019-02-02 – 2019-02-06 (×8): 0.5 mg via RESPIRATORY_TRACT
  Filled 2019-02-02 (×8): qty 2

## 2019-02-02 NOTE — Progress Notes (Signed)
Pt transferred to 3w11. Report called to [facility]. Assessment stable.

## 2019-02-02 NOTE — Progress Notes (Addendum)
NAME:  Angelica HeritageVickie S [name], MRN:  [id number], DOB:  11/30/1946, LOS: 8 ADMISSION DATE:  02/04/2019, CONSULTATION DATE:  02/04/2019 REFERRING MD:  Wilford CornerArora CHIEF COMPLAINT:   Brief History   73 year old female past medical history of tobacco abuse, 30 pack years, no quit date, diabetes, coronary disease, COPD, hypertension and hyperlipidemia who recently underwent open reduction internal fixation of the right upper extremity humeral fracture, who developed sudden onset of left facial droop 2/11, dysarthria and left hemiparesis . CTA brain revealed scan positive for severe proximal right M2 stenosis. Right parietal lobe ischemic penumbra without core infarct evident on CTP. Mild bilateral intracranial ICA stenoses. She underwent RT common carotid arteriogram followed by re-vascularization of occluded branch of the RT MCA sup division achieving a TICI  3 revascularization with reocclusion due to underlying arteriosclerosis. PCCM have been consulted for vent management in patient with known history of COPD.  Past Medical History  HTN Asthma  COPD  Arthritis  DM  CAD   Significant Hospital Events   2/07 Admit for ORIF of R humeral fracture  2/11 Developed facial droop, R MCA embolism to IR    02/10/2019 RT common carotid arteriogram followed by revascularization of occluded branch of the RT MCA sup division achieving a TICI  3 revascularization with reocclusion due to underlying arteriosclerosis  Consults:  PCCM 2/11   Procedures:  2/11 Right common carotid arteriogram followed by revascularization of occluded branch of the RT MCA sup division achieving a TICI  3 revascularization with reocclusion due to underlying arteriosclerosis.Marland Kitchen. Rescue stenting not performed due the small diameter of the the involved branch,and potential increased risk of ICH with adjuvant dual antiplatelets. Patients HG 6.1 on 2/11  Significant Diagnostic Tests:  CT Angio Head 2/11 >> Negative for large vessel occlusion but  positive for severe proximal right M2 stenosis. Right parietal lobe ischemic penumbra without core infarct evident on CTP. Mild bilateral intracranial ICA stenoses. No cervical carotid artery stenosis. Moderate right vertebral artery origin stenosis. Aortic Atherosclerosis. Post Procedure Ct Brain  2/11 >> No ICH or mass effect.   Micro Data:  MRSA PCR 2/11 >> Negative Urine Culture 2/10 >> negative  Antimicrobials:     Interim history/subjective:  Pt remains on [place] O2, failed swallow evaluation with SLP.  No acute events.   Objective   Blood pressure (!) 149/64, pulse 78, temperature 98 F (36.7 C), temperature source Oral, resp. rate 16, height 5\' 1"  (1.549 m), weight 90 kg, SpO2 100 %.    Vent Mode: BIPAP;PCV FiO2 (%):  [30 %] 30 % Set Rate:  [12 bmp] 12 bmp PEEP:  [4 cmH20] 4 cmH20   Intake/Output Summary (Last 24 hours) at 02/02/2019 0948 Last data filed at 02/02/2019 0700 Gross per 24 hour  Intake -  Output 800 ml  Net -800 ml   Filed Weights    1139 02/02/19 0500  Weight: 86 kg 90 kg   Physical Exam: General: elderly female lying in bed in NAD on [place] O2 HEENT: MM pink/moist, left facial droop  Neuro: left hemiparesis CV: s1s2 rrr, no m/r/g PULM: even/non-labored, lungs bilaterally coarse WU:JWJXGI:soft, non-tender, bsx4 active  Extremities: warm/dry, trace to 1+ edema, RUE wrapped in ACE/soft cast, pt able to move fingers, good cap refill Skin: no rashes or lesions  Resolved Hospital Problem list   Hypotension secondary to sedation Acute renal failure  Assessment & Plan:   Acute on Chronic Respiratory Failure s/p revascularization of occluded branch of the RT MCA  sup division  Presumed COPD (no PFTs on file) P: Wean O2 for sats 88-95% Pulmonary hygiene  Strict NPO  Adjust pulmicort dosing  Continue duoneb Q6   Large Right MCA Stroke s/p Revascularization  P: Neurology following, appreciate input  PT / OT efforts  ASA, plavix PT   Dysphagia    -failed swallow evaluation, coretrak in place P: SLP following  Place Cortrak > discontinue IVF's once TF started  Likely to have FEE's on 2/17  Hypertension P: Continue cardizem, bisoprolol PT   Iron Deficiency Anemia -s/p 1 U PRBC 2/11, no acute bleeding P: Trend CBC  Transfuse per ICU guidelines   NIDDM P: SSI Q4  Nonunion supracondylar humerus fracture, right upper extremity P: Per Ortho recommendations > soft splint and sling Monitor neurovascular checks of RUE   Best practice:  Diet: NPO, swallow evalution Pain/Anxiety/Delirium protocol (if indicated): N/A VAP protocol (if indicated): N/A DVT prophylaxis: SCD's hose  GI prophylaxis:Protonix Glucose control: CBG Q 4 with SSI Mobility: BR Code Status: Full Family Communication: No family at bedside am 2/15.   Disposition: Transfer to Neuro Progressive care > to TRH as of 2/16 am.   Labs   CBC: Recent Labs  Lab 02/04/2019 0835  01/21/2019 1230  01/28/2019 1800 01/28/2019 2200 01/30/19 0404 01/30/19 0439 01/31/19 0403 02/01/19 0044 02/01/19 0050 02/01/19 0656  WBC 9.3  --  12.1*  --  9.2 10.0 9.1  --  8.8  --   --  8.6  NEUTROABS 7.6  --  9.8*  --   --   --  7.5  --  7.1  --   --   --   HGB 6.1*   < > 7.0*   < > 7.7* 7.9* 7.9* 10.2* 7.7* 7.8* 9.9* 8.0*  HCT 20.2*   < > 22.9*   < > 24.5* 25.4* 25.4* 30.0* 24.6* 23.0* 29.0* 25.4*  MCV 99.5  --  99.1  --  95.7 96.2 97.7  --  96.9  --   --  97.7  PLT 372  --  469*  --  410* 405* 443*  --  453*  --   --  425*   < > = values in this interval not displayed.    Basic Metabolic Panel: Recent Labs  Lab 01/20/2019 0835  02/11/2019 1230  01/30/19 0040 01/30/19 0404 01/30/19 0439 01/31/19 0403 01/31/19 1650 02/01/19 0044 02/01/19 0050 02/01/19 0656  NA 140   < > 135   < >  --  138 138 142  --  144 144 145  K 4.0   < > 4.6   < >  --  3.8 3.8 4.0  --  3.4* 3.4* 3.7  CL 105  --  102  --   --  107  --  112*  --   --   --  110  CO2 20*  --  22  --   --  23  --  22   --   --   --  22  GLUCOSE 134*  --  162*  --   --  158*  --  151*  --   --   --  102*  BUN 40*  --  42*  --   --  34*  --  23  --   --   --  20  CREATININE 1.18*  --  1.33*  --   --  1.23*  --  0.93  --   --   --  0.90  CALCIUM 7.3*  --  8.1*  --   --  8.2*  --  8.4*  --   --   --  9.5  MG  --   --   --    < > 2.1 2.1  --  2.0 1.9  --   --  1.7  PHOS  --   --   --   --  3.8 3.5  --  3.6 3.3  --   --  3.0   < > = values in this interval not displayed.   GFR: Estimated Creatinine Clearance: 56.9 mL/min (by C-G formula based on SCr of 0.9 mg/dL). Recent Labs  Lab 02/07/2019 1443  01/28/2019 2200 01/30/19 0404 01/31/19 0403 02/01/19 0656  WBC  --    < > 10.0 9.1 8.8 8.6  LATICACIDVEN 0.6  --   --  0.6  --   --    < > = values in this interval not displayed.    Liver Function Tests: No results for input(s): AST, ALT, ALKPHOS, BILITOT, PROT, ALBUMIN in the last 168 hours. No results for input(s): LIPASE, AMYLASE in the last 168 hours. No results for input(s): AMMONIA in the last 168 hours.  ABG    Component Value Date/Time   PHART 7.357 02/01/2019 0050   PCO2ART 47.8 02/01/2019 0050   PO2ART 119.0 (H) 02/01/2019 0050   HCO3 26.8 02/01/2019 0050   TCO2 28 02/01/2019 0050   ACIDBASEDEF 5.0 (H) 01/30/2019 0439   O2SAT 98.0 02/01/2019 0050     Coagulation Profile: Recent Labs  Lab 01/21/2019 0835 02/02/2019 1230  INR 1.24 1.18    Cardiac Enzymes: No results for input(s): CKTOTAL, CKMB, CKMBINDEX, TROPONINI in the last 168 hours.  HbA1C: Hgb A1c MFr Bld  Date/Time Value Ref Range Status  01/30/2019 04:04 AM 6.1 (H) 4.8 - 5.6 % Final    Comment:    (NOTE) Pre diabetes:          5.7%-6.4% Diabetes:              >6.4% Glycemic control for   <7.0% adults with diabetes   01/21/2019 10:55 PM 6.4 (H) 4.8 - 5.6 % Final    Comment:    (NOTE) Pre diabetes:          5.7%-6.4% Diabetes:              >6.4% Glycemic control for   <7.0% adults with diabetes     CBG: Recent Labs   Lab 02/01/19 1637 02/01/19 1922 02/01/19 2323 02/02/19 0329 02/02/19 0758  GLUCAP 105* 123* 127* 113* 123*   Canary Brim, NP-C [facility] Pgr: [phone] or if no answer [phone] 02/02/2019, 9:48 AM

## 2019-02-02 NOTE — Procedures (Signed)
Cortrak  Person Inserting Tube:  Christophe Louis A, RD Tube Type:  Cortrak - 43 inches Tube Location:  Left nare Initial Placement:  Postpyloric Secured by: Tape Technique Used to Measure Tube Placement:  Documented cm marking at nare/ corner of mouth Cortrak Secured At:  78 cm    Cortrak Tube Team Note:  Consult received to place a Cortrak feeding tube.   Per cortrak monitor, tube terminates in proximal-mid duodenum.   No x-ray is required. RN may begin using tube. RD to initiate the TF recommendations left by RD yesterday afternoon. RN is aware pt has not had BM  If the tube becomes dislodged please keep the tube and contact the Cortrak team at [email or web address] (password [email or web address]) for replacement.  If after hours and replacement cannot be delayed, place a NG tube and confirm placement with an abdominal x-ray.   Christophe Louis RD, LDN, CNSC Clinical Nutrition Available Tues-Sat via Pager: [phone] 02/02/2019 12:14 PM

## 2019-02-02 NOTE — Progress Notes (Signed)
STROKE TEAM PROGRESS NOTE   INTERVAL HISTORY No family present. Now off BiPAP and on nasal cannula oxygen and seems comfortable.. Awake and following commands but remains with dense right hemiplegia.  Vitals:   02/02/19 0900 02/02/19 1000 02/02/19 1100 02/02/19 1200  BP: (!) 149/64 (!) 162/80 (!) 171/68   Pulse: 78 88 91   Resp: 16 (!) 37 (!) 21   Temp:    98.9 F (37.2 C)  TempSrc:    Oral  SpO2: 100% 100% 99%   Weight:      Height:        CBC:  Recent Labs  Lab 01/30/19 0404  01/31/19 0403  02/01/19 0656 02/02/19 1122  WBC 9.1  --  8.8  --  8.6 8.2  NEUTROABS 7.5  --  7.1  --   --   --   HGB 7.9*   < > 7.7*   < > 8.0* 7.8*  HCT 25.4*   < > 24.6*   < > 25.4* 26.6*  MCV 97.7  --  96.9  --  97.7 100.4*  PLT 443*  --  453*  --  425* 542*   < > = values in this interval not displayed.    Basic Metabolic Panel:  Recent Labs  Lab 02/01/19 0656 02/02/19 1122  NA 145 146*  K 3.7 3.4*  CL 110 109  CO2 22 23  GLUCOSE 102* 156*  BUN 20 22  CREATININE 0.90 0.96  CALCIUM 9.5 10.2  MG 1.7 1.8  PHOS 3.0 2.6   Lipid Panel:     Component Value Date/Time   CHOL 108 01/30/2019 0404   TRIG 264 (H) 01/30/2019 0404   HDL 23 (L) 01/30/2019 0404   CHOLHDL 4.7 01/30/2019 0404   VLDL 53 (H) 01/30/2019 0404   LDLCALC 32 01/30/2019 0404   HgbA1c:  Lab Results  Component Value Date   HGBA1C 6.1 (H) 01/30/2019   Urine Drug Screen: No results found for: LABOPIA, COCAINSCRNUR, LABBENZ, AMPHETMU, THCU, LABBARB  Alcohol Level No results found for: [facility]  IMAGING Dg Chest Port 1 View  Result Date: 02/01/2019 CLINICAL DATA:  Respiratory failure EXAM: PORTABLE CHEST 1 VIEW COMPARISON:  Yesterday FINDINGS: Tracheal and esophageal extubation. Low volume chest with indistinct opacities at the bases, stable. No pneumothorax. Cardiomegaly. IMPRESSION: Extubation with stable low volume chest and basilar opacity. Electronically Signed   By: Marnee Spring M.D.   On: 02/01/2019 08:47   2D  Echocardiogram   1. The left ventricle has normal systolic function, with an ejection fraction of 55-60%. The cavity size was normal. Left ventricular diastolic Doppler parameters are consistent with impaired relaxation.  2. The right ventricle has normal systolic function. The cavity was normal. There is no increase in right ventricular wall thickness.  3. The mitral valve is normal in structure.  4. The tricuspid valve is normal in structure.  5. The aortic valve is normal in structure.  6. The aortic root and ascending aorta are normal in size and structure.  7. Right atrial pressure is estimated at 8 mmHg.  8. The interatrial septum was not assessed.   PHYSICAL EXAM General: Awake alert in no distress HEENT: Normocephalic atraumatic dry mucous membranes Lungs: Clear to auscultation Cardiovascular: S1-S2 heard regular rate rhythm   Patient awake On BiPAP following commands this morning  Pupils are 70mm and equal and reactive  extraocular movement exam shows inability to gaze to the left.  She cannot cross midline to look to  the left.  No visual field cuts based on confrontation.  Complete left lower facial paralysis.  Palate midline.  Tongue midline. Motor exam: Right upper extremity in cast and difficult examined.  left hemiplegia with 1-2/5 strength.  5/5 right lower extremity. Sensory exam: grimaces to pain throughout . Coordination: Unable to perform   ASSESSMENT/PLAN [name] is a 73 y.o. 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 In hospital developed confusion, left-sided facial droop, left arm and leg weakness and numbness.  She was not a TPA candidate secondary to recent surgery.  She was taken to  IR, with unsuccessful revascularization.  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 ASPECTS 10.     CTA head & neck no LVO.  Severe R M2 stenosis.  Mild bilateral ICA stenosis.  Moderate RVA stenosis.  Aortic atherosclerosis.  CT perfusion R parietal ischemic penumbra without core infarct  MRI  Large acute R MCA infarct w/ mod edema, no shift  2D Echo  EF 55-60%. No source of embolus   LDL 32 on [place]  HgbA1c 6.1  no VTE prophylaxis.  Lovenox 40 mg subcu daily added Diet Order            Diet NPO time specified  Diet effective now              No antithrombotic prior to admission, now on No antithrombotic. Given large vessel intracranial atherosclerosis, patient should be treated with aspirin 81 mg and clopidogrel 75 mg orally every day x 3 months for secondary stroke prevention. After 3 months, change to plavix alone.   Therapy recommendations: CLR  Disposition:  pending   Acute respiratory failure  COPD  Intubated for attempted IR   CXR atx w/ prob small L effusion  Weaning underway. Plans for extubation today  PCCM following  Hypertension  Treated with cleviprex post IR, off  PTA: diltiazem 120 q 24h  Now on cardizem 30 q4  BP goal < 160 . 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 Renal US no hydro. Cr 0.93  COPD  Non union supracondylar humerus fracture, R - stable from surgeon standpoint  Hospital day # 8  Continue dual antiplatelet therapy.   Mobilize out of bed. Therapy and  rehabilitation consults. No family available at the bedside for discussion. Discussed with Zannie Kehr critical care medicine.  transfer to neurology floor bed. Greater than 50% time during this 35 minute visit was spent on counseling and coordination of care about her stroke and discussion about plan of care and answering questions     Delia Heady, MSN, APRN, ANVP-BC, AGPCNP-BC Advanced Practice Stroke Nurse [facility] See Amion for Schedule & Pager information 02/02/2019 1:17 PM      To contact Stroke Continuity provider, please refer to  [email or web address] After hours, contact General Neurology

## 2019-02-02 NOTE — Progress Notes (Signed)
  Speech Language Pathology Treatment: Dysphagia  Patient Details Name: Angelica [name] MRN: [id number] DOB: 18-Nov-1946 Today's Date: 02/02/2019 Time: 0950-1005 SLP Time Calculation (min) (ACUTE ONLY): 15 min  Assessment / Plan / Recommendation Clinical Impression  Pt is more alert today during reassessment of dysphagia, reduced attention and awareness still limit ability to follow commands. Oral care performed, and pt required max A to maintain mouth opening to allow cleaning of her tongue and palate. Voice remains hoarse. Pt tolerates small ice chips without overt signs of aspiration, however she requires frequent verbal and tactile cues for attention to POs. She frequently requests more before initiating a swallow. With small amount of water from spoon, pt with immediate weak coughing and increased respiratory rate, suggestive of airway compromise. Prognosis good with additional time post-extubation, though short-term means of alternative nutrition (cortrak) likely necessary as pt remains insufficiently attentive to PO trials for instrumental assessment. D/w RN, NP. Will continue to follow for readiness for instrumental testing. Will consider MBS next date or possibly FEES Monday if appropriate. Pt may have a few small ice chips with RN after oral care; hold if coughing or respiratory distress.     HPI HPI: 74 y.o. female admitted on 11-Feb-2019 for known nonunion supracondylar R humerus fx (fx from a fall earlier fall 2019 -splinted and non surgical management failed) s/p ORIF on 02/11/2019. Intubated 2/11- 2/14 in am. Per chart daughter noted left-sided facial droop, left upper and lower extremity weakness and numbness on the left side.   CTA brain revealed scan positive for severe proximal right M2 stenosis. Right parietal lobe ischemic penumbra without core infarct evident on CTP. Mild bilateral intracranial ICA stenoses. She underwent RT common carotid arteriogram followed by  re-vascularization of  occluded branch of the RT MCA sup division achieving a TICI  3 revascularization with reocclusion due to underlying arteriosclerosis. PMH significant for HTN, DM, CAD, COPD, asthma, foot surgery, back surgery, and L TKA.      SLP Plan  Continue with current plan of care;Other (Comment);MBS(MBS or FEES when appropriate)       Recommendations  Diet recommendations: NPO;Other(comment)(may have a few small ice chips after oral care) Medication Administration: Via alternative means(cortrak) Supervision: Full supervision/cueing for compensatory strategies                Oral Care Recommendations: Oral care QID Follow up Recommendations: (tbd) SLP Visit Diagnosis: Dysphagia, unspecified (R13.10) Plan: Continue with current plan of care;Other (Comment);MBS(MBS or FEES when appropriate)       GO              Angelica Baton, MS, CCC-SLP Speech-Language Pathologist Acute Rehabilitation Services Pager: [phone] Office: [phone]   Angelica [name] 02/02/2019, 10:12 AM

## 2019-02-03 LAB — CBC
HCT: 26.2 % — ABNORMAL LOW (ref 36.0–46.0)
Hemoglobin: 8 g/dL — ABNORMAL LOW (ref 12.0–15.0)
MCH: 30.3 pg (ref 26.0–34.0)
MCHC: 30.5 g/dL (ref 30.0–36.0)
MCV: 99.2 fL (ref 80.0–100.0)
PLATELETS: 575 10*3/uL — AB (ref 150–400)
RBC: 2.64 MIL/uL — AB (ref 3.87–5.11)
RDW: 14.7 % (ref 11.5–15.5)
WBC: 9.8 10*3/uL (ref 4.0–10.5)
nRBC: 0 % (ref 0.0–0.2)

## 2019-02-03 LAB — GLUCOSE, CAPILLARY
Glucose-Capillary: 134 mg/dL — ABNORMAL HIGH (ref 70–99)
Glucose-Capillary: 140 mg/dL — ABNORMAL HIGH (ref 70–99)
Glucose-Capillary: 189 mg/dL — ABNORMAL HIGH (ref 70–99)
Glucose-Capillary: 123 mg/dL — ABNORMAL HIGH (ref 70–99)
Glucose-Capillary: 208 mg/dL — ABNORMAL HIGH (ref 70–99)
Glucose-Capillary: 193 mg/dL — ABNORMAL HIGH (ref 70–99)

## 2019-02-03 LAB — BASIC METABOLIC PANEL
Anion gap: 10 (ref 5–15)
BUN: 32 mg/dL — ABNORMAL HIGH (ref 8–23)
CO2: 28 mmol/L (ref 22–32)
Calcium: 11 mg/dL — ABNORMAL HIGH (ref 8.9–10.3)
Chloride: 113 mmol/L — ABNORMAL HIGH (ref 98–111)
Creatinine, Ser: 0.72 mg/dL (ref 0.44–1.00)
GFR calc Af Amer: >60 mL/min (ref >60)
GFR calc non Af Amer: >60 mL/min (ref >60)
Glucose, Bld: 207 mg/dL — ABNORMAL HIGH (ref 70–99)
POTASSIUM: 3.4 mmol/L — AB (ref 3.5–5.1)
Sodium: 151 mmol/L — ABNORMAL HIGH (ref 135–145)

## 2019-02-03 LAB — MAGNESIUM: Magnesium: 1.8 mg/dL (ref 1.7–2.4)

## 2019-02-03 LAB — PHOSPHORUS: Phosphorus: 2.3 mg/dL — ABNORMAL LOW (ref 2.5–4.6)

## 2019-02-03 MED ORDER — BISOPROLOL FUMARATE 10 MG PO TABS
5.0000 mg | ORAL_TABLET | Freq: Every day | ORAL | Status: DC
  Administered 2019-02-04 – 2019-02-06 (×3): 5 mg
  Filled 2019-02-03 (×3): qty 1

## 2019-02-03 MED ORDER — VITAMIN C 500 MG PO TABS
1000.0000 mg | ORAL_TABLET | Freq: Every day | ORAL | Status: DC
  Administered 2019-02-04 – 2019-02-06 (×3): 1000 mg
  Filled 2019-02-03 (×3): qty 2

## 2019-02-03 MED ORDER — PANTOPRAZOLE SODIUM 40 MG PO PACK
40.0000 mg | PACK | Freq: Every day | ORAL | Status: DC
  Administered 2019-02-03 – 2019-02-06 (×4): 40 mg
  Filled 2019-02-03 (×4): qty 20

## 2019-02-03 MED ORDER — ALUM & MAG HYDROXIDE-SIMETH 200-200-20 MG/5ML PO SUSP: 30.0000 mL | Freq: Four times a day (QID) | ORAL | Status: DC | PRN

## 2019-02-03 MED ORDER — POTASSIUM CHLORIDE 20 MEQ/15ML (10%) PO SOLN
40.0000 meq | Freq: Once | ORAL | Status: AC
  Administered 2019-02-03: 40 meq
  Filled 2019-02-03: qty 30

## 2019-02-03 MED ORDER — FREE WATER
200.0000 mL | Freq: Three times a day (TID) | Status: DC
  Administered 2019-02-03 – 2019-02-04 (×3): 200 mL

## 2019-02-03 MED ORDER — IPRATROPIUM-ALBUTEROL 0.5-2.5 (3) MG/3ML IN SOLN
3.0000 mL | Freq: Three times a day (TID) | RESPIRATORY_TRACT | Status: DC
  Administered 2019-02-03 – 2019-02-06 (×10): 3 mL via RESPIRATORY_TRACT
  Filled 2019-02-03 (×10): qty 3

## 2019-02-03 MED ORDER — DILTIAZEM 12 MG/ML ORAL SUSPENSION
60.0000 mg | Freq: Four times a day (QID) | ORAL | Status: DC
  Administered 2019-02-03 – 2019-02-06 (×10): 60 mg
  Filled 2019-02-03 (×13): qty 6

## 2019-02-03 MED ORDER — ACETAMINOPHEN 160 MG/5ML PO SOLN
650.0000 mg | Freq: Four times a day (QID) | ORAL | Status: DC | PRN
  Administered 2019-02-04 – 2019-02-05 (×2): 650 mg
  Filled 2019-02-03 (×2): qty 20.3

## 2019-02-03 MED ORDER — ALBUTEROL SULFATE (2.5 MG/3ML) 0.083% IN NEBU: 3.0000 mL | INHALATION_SOLUTION | RESPIRATORY_TRACT | Status: DC | PRN

## 2019-02-03 NOTE — Progress Notes (Signed)
During periods of increased activity or when patient's family is not supervising, tube feeds have been paused due to fear of aspiration because patient is often repositioning herself into a flat position on the bed. When family is in the room, tube feed is restarted. Attending physician Dr. Sharon Seller was notified and aware. MD stated he would explore further options for nutrition when he was able to see her. Will continue to monitor.

## 2019-02-03 NOTE — Progress Notes (Signed)
[place] TEAM 1 - Stepdown/ICU TEAM  Angelica [name]  ION:[id number] DOB: 10/02/1946 DOA: 01/19/2019 PCP: Joette CatchingNyland, Leonard, MD    Brief Narrative:  73yo w/ hx of tobacco abuse, DM, CAD, COPD, HTN, and hyperlipidemia who underwent ORIF of a R humeral fracture on 2/7 and then developed sudden onset of left facial droop 2/11 w/ dysarthria and left hemiparesis. CTA brain revealed severe proximal right M2 stenosis. Right parietal lobe ischemic penumbra without core infarct was evident on CTP. She underwent a R common carotid arteriogram followed by re-vascularization of an occluded branch of the R MCA sup division achieving a TICI 3 revascularization with re-occlusion due to underlying arteriosclerosis.  Significant Events: 2/07 Admit for ORIF of R humeral fracture  2/11 Developed facial droop, R MCA embolism 2/11 R common carotid arteriogram followed by revascularization of occluded branch of the R MCA sup division >  reocclusion  2/15 Cortrak placed  2/16 TRH assumed care   Subjective: Resting comfortably. Continues to slide down in bed, despite frequent repositioning from staff. No resp distress. No uncontrolled pain.   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   MRSA screen +  DVT prophylaxis: lovenox  Code Status: FULL CODE Family  Communication: no family present at time of exam  Disposition Plan: stable for tele bed   Consultants:  PCCM Neurology  Antimicrobials:  none  Objective: Blood pressure (!) 180/85, pulse 83, temperature 98.7 F (37.1 C), resp. rate (!) 24, height 5\' 1"  (1.549 m), weight 90 kg, SpO2 96 %.  Intake/Output Summary (Last 24 hours) at 02/03/2019 1513 Last data filed at 02/03/2019 1452 Gross per 24 hour  Intake 1373.33 ml  Output -  Net 1373.33 ml   Filed Weights   02/10/2019 1139 02/02/19 0500  Weight: 86 kg 90 kg    Examination: General: No acute respiratory distress Lungs: Clear to auscultation bilaterally without wheezes or crackles Cardiovascular: Regular rate and rhythm without murmur  Abdomen: Nondistended, overweight, soft, bowel sounds positive Extremities: trace B LE edema   CBC: Recent Labs  Lab 02/14/2019 1230  01/30/19 0404  01/31/19 0403  02/01/19 0656 02/02/19 1122 02/03/19 1024  WBC 12.1*   < > 9.1  --  8.8  --  8.6 8.2 9.8  NEUTROABS 9.8*  --  7.5  --  7.1  --   --   --   --   HGB 7.0*   < > 7.9*   < > 7.7*   < > 8.0* 7.8* 8.0*  HCT 22.9*   < > 25.4*   < > 24.6*   < > 25.4* 26.6* 26.2*  MCV 99.1   < > 97.7  --  96.9  --  97.7 100.4* 99.2  PLT 469*   < >  443*  --  453*  --  425* 542* 575*   < > = values in this interval not displayed.   Basic Metabolic Panel: Recent Labs  Lab 02/01/19 0656 02/02/19 1122 02/02/19 1818 02/03/19 1024  NA 145 146*  --  151*  K 3.7 3.4*  --  3.4*  CL 110 109  --  113*  CO2 22 23  --  28  GLUCOSE 102* 156*  --  207*  BUN 20 22  --  32*  CREATININE 0.90 0.96  --  0.72  CALCIUM 9.5 10.2  --  11.0*  MG 1.7 1.8 1.7 1.8  PHOS 3.0 2.6 2.3* 2.3*   GFR: Estimated Creatinine Clearance: 64 mL/min (by C-G formula based on SCr of 0.72 mg/dL).  Liver Function Tests: No results for input(s): AST, ALT, ALKPHOS, BILITOT, PROT, ALBUMIN in the last 168 hours. No results for input(s): LIPASE, AMYLASE in the last 168 hours. No  results for input(s): AMMONIA in the last 168 hours.  Coagulation Profile: Recent Labs  Lab 01/23/2019 0835 02/08/2019 1230  INR 1.24 1.18   HbA1C: Hgb A1c MFr Bld  Date/Time Value Ref Range Status  01/30/2019 04:04 AM 6.1 (H) 4.8 - 5.6 % Final    Comment:    (NOTE) Pre diabetes:          5.7%-6.4% Diabetes:              >6.4% Glycemic control for   <7.0% adults with diabetes   01/23/2019 10:55 PM 6.4 (H) 4.8 - 5.6 % Final    Comment:    (NOTE) Pre diabetes:          5.7%-6.4% Diabetes:              >6.4% Glycemic control for   <7.0% adults with diabetes     CBG: Recent Labs  Lab 02/02/19 2345 02/03/19 0452 02/03/19 0756 02/03/19 1106 02/03/19 1504  GLUCAP 147* 134* 140* 189* 123*    Recent Results (from the past 240 hour(s))  Culture, Urine     Status: None   Collection Time: 01/28/19 10:56 AM  Result Value Ref Range Status   Specimen Description URINE, RANDOM  Final   Special Requests NONE  Final   Culture   Final    NO GROWTH Performed at [facility], [address] [place], [place] [address]    Report Status 02/11/2019 FINAL  Final  MRSA PCR Screening     Status: Abnormal   Collection Time: 01/31/2019 12:33 PM  Result Value Ref Range Status   MRSA by PCR POSITIVE (A) NEGATIVE Final    Comment:        The GeneXpert MRSA Assay (FDA approved for NASAL specimens only), is one component of a comprehensive MRSA colonization surveillance program. It is not intended to diagnose MRSA infection nor to guide or monitor treatment for MRSA infections. RESULT CALLED TO, READ BACK BY AND VERIFIED WITH: Hector Brunswick RN 14:40 01/28/2019 ([email or web address]) Performed at [facility], [address] [place], [place] [address]      Scheduled Meds: .  stroke: mapping our early stages of recovery book   Does not apply Once  . aspirin  300 mg Rectal Daily   Or  . aspirin  81 mg Per Tube Daily  . bisoprolol  5 mg Oral Daily  . budesonide (PULMICORT) nebulizer  solution  0.5 mg Nebulization BID  . chlorhexidine  15 mL Mouth Rinse BID  .  clopidogrel  75 mg Per Tube Daily  . diltiazem  30 mg Per Tube Q6H  . docusate  100 mg Per Tube BID  . DULoxetine  60 mg Oral Daily  . enoxaparin (LOVENOX) injection  40 mg Subcutaneous Q24H  . feeding supplement (PRO-STAT SUGAR FREE 64)  30 mL Per Tube BID  . free water  100 mL Per Tube Q8H  . insulin aspart  0-9 Units Subcutaneous Q4H  . ipratropium-albuterol  3 mL Nebulization TID  . mouth rinse  15 mL Mouth Rinse q12n4p  . omega-3 acid ethyl esters  2 g Per Tube BID  . pantoprazole (PROTONIX) IV  40 mg Intravenous Q24H  . vitamin C  1,000 mg Oral Daily     LOS: 9 days   Lonia Blood, MD [facility] Office  [phone] Pager - Text Page per Loretha Stapler  If 7PM-7AM, please contact night-coverage per Amion 02/03/2019, 3:13 PM

## 2019-02-03 NOTE — Progress Notes (Signed)
When dayshift nurse arrived there was approximately 900cc of tube feed left from bottle hung yesterday 02/02/19 at 1621. Dayshift nurse was informed by nightshift nurse that she had held the tube feed for fear of aspiration due to patient sliding down in bed. Dayshift nurse has repositioned patient and restarted tube feed. Will continue to monitor patient and reposition to maintain HOB >30 degrees.

## 2019-02-04 ENCOUNTER — Inpatient Hospital Stay (HOSPITAL_COMMUNITY): Payer: Medicare Other

## 2019-02-04 DIAGNOSIS — J9602 Acute respiratory failure with hypercapnia: Secondary | ICD-10-CM

## 2019-02-04 LAB — BLOOD GAS, ARTERIAL
Acid-Base Excess: 9.8 mmol/L — ABNORMAL HIGH (ref 0.0–2.0)
Bicarbonate: 34.7 mmol/L — ABNORMAL HIGH (ref 20.0–28.0)
DRAWN BY: 535471
O2 Content: 2 L/min
O2 Saturation: 95.8 %
PCO2 ART: 54.8 mmHg — AB (ref 32.0–48.0)
Patient temperature: 98.6
pH, Arterial: 7.417 (ref 7.350–7.450)
pO2, Arterial: 78.1 mmHg — ABNORMAL LOW (ref 83.0–108.0)

## 2019-02-04 LAB — COMPREHENSIVE METABOLIC PANEL
ALT: 17 U/L (ref 0–44)
AST: 18 U/L (ref 15–41)
Albumin: 2.8 g/dL — ABNORMAL LOW (ref 3.5–5.0)
Alkaline Phosphatase: 53 U/L (ref 38–126)
Anion gap: 8 (ref 5–15)
BUN: 43 mg/dL — ABNORMAL HIGH (ref 8–23)
CO2: 33 mmol/L — ABNORMAL HIGH (ref 22–32)
Calcium: 10.9 mg/dL — ABNORMAL HIGH (ref 8.9–10.3)
Chloride: 113 mmol/L — ABNORMAL HIGH (ref 98–111)
Creatinine, Ser: 0.83 mg/dL (ref 0.44–1.00)
GFR calc non Af Amer: >60 mL/min (ref >60)
Glucose, Bld: 235 mg/dL — ABNORMAL HIGH (ref 70–99)
Potassium: 3.4 mmol/L — ABNORMAL LOW (ref 3.5–5.1)
Sodium: 154 mmol/L — ABNORMAL HIGH (ref 135–145)
Total Bilirubin: 1 mg/dL (ref 0.3–1.2)
Total Protein: 6.9 g/dL (ref 6.5–8.1)

## 2019-02-04 LAB — GLUCOSE, CAPILLARY
GLUCOSE-CAPILLARY: 263 mg/dL — AB (ref 70–99)
Glucose-Capillary: 205 mg/dL — ABNORMAL HIGH (ref 70–99)
Glucose-Capillary: 214 mg/dL — ABNORMAL HIGH (ref 70–99)
Glucose-Capillary: 242 mg/dL — ABNORMAL HIGH (ref 70–99)
Glucose-Capillary: 248 mg/dL — ABNORMAL HIGH (ref 70–99)
Glucose-Capillary: 260 mg/dL — ABNORMAL HIGH (ref 70–99)

## 2019-02-04 LAB — CBC
HCT: 29.9 % — ABNORMAL LOW (ref 36.0–46.0)
Hemoglobin: 8.7 g/dL — ABNORMAL LOW (ref 12.0–15.0)
MCH: 29.3 pg (ref 26.0–34.0)
MCHC: 29.1 g/dL — AB (ref 30.0–36.0)
MCV: 100.7 fL — ABNORMAL HIGH (ref 80.0–100.0)
Platelets: 620 10*3/uL — ABNORMAL HIGH (ref 150–400)
RBC: 2.97 MIL/uL — ABNORMAL LOW (ref 3.87–5.11)
RDW: 14.7 % (ref 11.5–15.5)
WBC: 12.6 10*3/uL — AB (ref 4.0–10.5)
nRBC: 0.2 % (ref 0.0–0.2)

## 2019-02-04 LAB — POCT I-STAT 7, (LYTES, BLD GAS, ICA,H+H)
Acid-Base Excess: 16 mmol/L — ABNORMAL HIGH (ref 0.0–2.0)
BICARBONATE: 42.8 mmol/L — AB (ref 20.0–28.0)
Calcium, Ion: 1.47 mmol/L — ABNORMAL HIGH (ref 1.15–1.40)
HEMATOCRIT: 27 % — AB (ref 36.0–46.0)
Hemoglobin: 9.2 g/dL — ABNORMAL LOW (ref 12.0–15.0)
O2 Saturation: 100 %
Potassium: 3.7 mmol/L (ref 3.5–5.1)
Sodium: 154 mmol/L — ABNORMAL HIGH (ref 135–145)
TCO2: 45 mmol/L — ABNORMAL HIGH (ref 22–32)
pCO2 arterial: 69.4 mmHg (ref 32.0–48.0)
pH, Arterial: 7.398 (ref 7.350–7.450)
pO2, Arterial: 411 mmHg — ABNORMAL HIGH (ref 83.0–108.0)

## 2019-02-04 LAB — MAGNESIUM: Magnesium: 2 mg/dL (ref 1.7–2.4)

## 2019-02-04 MED ORDER — FENTANYL 2500MCG IN NS 250ML (10MCG/ML) PREMIX INFUSION
0.0000 ug/h | INTRAVENOUS | Status: DC
  Administered 2019-02-04: 50 ug/h via INTRAVENOUS

## 2019-02-04 MED ORDER — SUCCINYLCHOLINE CHLORIDE 20 MG/ML IJ SOLN
100.0000 mg | Freq: Once | INTRAMUSCULAR | Status: AC
  Administered 2019-02-04: 100 mg via INTRAVENOUS

## 2019-02-04 MED ORDER — POTASSIUM CHLORIDE 20 MEQ/15ML (10%) PO SOLN
40.0000 meq | Freq: Once | ORAL | Status: AC
  Administered 2019-02-04: 40 meq
  Filled 2019-02-04: qty 30

## 2019-02-04 MED ORDER — CHLORHEXIDINE GLUCONATE 0.12% ORAL RINSE (MEDLINE KIT)
15.0000 mL | Freq: Two times a day (BID) | OROMUCOSAL | Status: DC
  Administered 2019-02-04 – 2019-02-06 (×4): 15 mL via OROMUCOSAL

## 2019-02-04 MED ORDER — FENTANYL CITRATE (PF) 100 MCG/2ML IJ SOLN
50.0000 ug | Freq: Once | INTRAMUSCULAR | Status: AC
  Administered 2019-02-04: 50 ug via INTRAVENOUS

## 2019-02-04 MED ORDER — PROPOFOL 1000 MG/100ML IV EMUL
INTRAVENOUS | Status: AC
  Filled 2019-02-04: qty 100

## 2019-02-04 MED ORDER — SODIUM CHLORIDE 0.9 % IV SOLN
3.0000 g | Freq: Three times a day (TID) | INTRAVENOUS | Status: DC
  Administered 2019-02-04 – 2019-02-06 (×5): 3 g via INTRAVENOUS
  Filled 2019-02-04 (×8): qty 3

## 2019-02-04 MED ORDER — FENTANYL CITRATE (PF) 100 MCG/2ML IJ SOLN
INTRAMUSCULAR | Status: AC
  Filled 2019-02-04: qty 2

## 2019-02-04 MED ORDER — PROPOFOL 1000 MG/100ML IV EMUL
5.0000 ug/kg/min | INTRAVENOUS | Status: DC
  Administered 2019-02-04 – 2019-02-05 (×3): 30 ug/kg/min via INTRAVENOUS
  Filled 2019-02-04 (×2): qty 100

## 2019-02-04 MED ORDER — FREE WATER
250.0000 mL | Freq: Four times a day (QID) | Status: DC
  Administered 2019-02-04 – 2019-02-05 (×3): 250 mL

## 2019-02-04 MED ORDER — ORAL CARE MOUTH RINSE
15.0000 mL | OROMUCOSAL | Status: DC
  Administered 2019-02-04 – 2019-02-06 (×15): 15 mL via OROMUCOSAL

## 2019-02-04 MED ORDER — ETOMIDATE 2 MG/ML IV SOLN
16.0000 mg | Freq: Once | INTRAVENOUS | Status: AC
  Administered 2019-02-04: 16 mg via INTRAVENOUS

## 2019-02-04 MED ORDER — FENTANYL BOLUS VIA INFUSION
30.0000 ug | INTRAVENOUS | Status: DC | PRN
  Filled 2019-02-04: qty 30

## 2019-02-04 MED ORDER — MIDAZOLAM HCL 2 MG/2ML IJ SOLN
INTRAMUSCULAR | Status: AC
  Filled 2019-02-04: qty 2

## 2019-02-04 MED ORDER — MIDAZOLAM HCL 2 MG/2ML IJ SOLN
2.0000 mg | Freq: Once | INTRAMUSCULAR | Status: AC
  Administered 2019-02-04: 2 mg via INTRAVENOUS

## 2019-02-04 MED ORDER — DEXAMETHASONE SODIUM PHOSPHATE 4 MG/ML IJ SOLN
4.0000 mg | Freq: Three times a day (TID) | INTRAMUSCULAR | Status: DC
  Administered 2019-02-04 – 2019-02-05 (×4): 4 mg via INTRAVENOUS
  Filled 2019-02-04 (×4): qty 1

## 2019-02-04 MED ORDER — FUROSEMIDE 10 MG/ML IJ SOLN
40.0000 mg | Freq: Once | INTRAMUSCULAR | Status: AC
  Administered 2019-02-04: 40 mg via INTRAVENOUS
  Filled 2019-02-04: qty 4

## 2019-02-04 NOTE — Progress Notes (Signed)
Inpatient Diabetes Program Recommendations  AACE/ADA: New Consensus Statement on Inpatient Glycemic Control  Target Ranges:  Prepandial:   less than 140 mg/dL      Peak postprandial:   less than 180 mg/dL (1-2 hours)      Critically ill patients:  140 - 180 mg/dL   Results for Angelica [name], Angelica [name] (MRN [id number]) as of 02/04/2019 13:20  Ref. Range 02/03/2019 07:56 02/03/2019 11:06 02/03/2019 15:04 02/03/2019 20:10 02/03/2019 23:17 02/04/2019 05:03 02/04/2019 08:47 02/04/2019 11:30  Glucose-Capillary Latest Ref Range: 70 - 99 mg/dL 638 (H) 466 (H) 599 (H) 208 (H) 193 (H) 214 (H) 260 (H) 263 (H)   Review of Glycemic Control  Diabetes history: DM2 Outpatient Diabetes medications: Metformin 1000 mg BID, Januvia 100 mg daily, Glipizide XL 10 mg QHS as needed if glucose >150 mg/dl Current orders for Inpatient glycemic control: Novolog 0-9 units Q4H  Inpatient Diabetes Program Recommendations:  Insulin - Tube Feeding Coverage: Please consider ordering Novolog 3 units Q4H for tube feeding coverage. If tube feeding is stopped or held, then Novolog tube feeding coverage should also be stopped or held.  Thanks, Orlando Penner, RN, MSN, CDE Diabetes Coordinator Inpatient Diabetes Program [phone] (Team Pager from 8am to 5pm)

## 2019-02-04 NOTE — Procedures (Signed)
Central Venous Catheter Insertion Procedure Note TYREANA BALDERAZ [id number] 07/17/1946  Procedure: Insertion of Central Venous Catheter Indications: Drug and/or fluid administration and Frequent blood sampling  Procedure Details Consent: Unable to obtain consent because of emergent medical necessity. Time Out: Verified patient identification, verified procedure, site/side was marked, verified correct patient position, special equipment/implants available, medications/allergies/relevent history reviewed, required imaging and test results available.  Performed  Maximum sterile technique was used including antiseptics, cap, gloves, gown, hand hygiene, mask and sheet. Skin prep: Chlorhexidine; local anesthetic administered A antimicrobial bonded/coated 7.56F triple lumen catheter was placed in the left subclavian vein using the Seldinger technique to insertion depth of 17cm.  Evaluation Blood flow good Complications: No apparent complications Patient did tolerate procedure well. Chest X-ray ordered to verify placement.  CXR: pending.  Waris Rodger 02/04/2019, 6:27 PM

## 2019-02-04 NOTE — Progress Notes (Signed)
STROKE TEAM PROGRESS NOTE   INTERVAL HISTORY No family present.she is obtunded with depressed sensorium this morning. She has labored breathing. She just gotarterial blood gas which shows PCO2 of 54  and PO2 of 79. White count is elevated at 12.6 and 7 sodium at 154  Vitals:   02/04/19 0502 02/04/19 0714 02/04/19 0716 02/04/19 1328  BP:      Pulse:      Resp:      Temp: 98.6 F (37 C)     TempSrc: Axillary     SpO2:  98% 98% 96%  Weight:      Height:        CBC:  Recent Labs  Lab 01/30/19 0404  01/31/19 0403  02/03/19 1024 02/04/19 0621  WBC 9.1  --  8.8   < > 9.8 12.6*  NEUTROABS 7.5  --  7.1  --   --   --   HGB 7.9*   < > 7.7*   < > 8.0* 8.7*  HCT 25.4*   < > 24.6*   < > 26.2* 29.9*  MCV 97.7  --  96.9   < > 99.2 100.7*  PLT 443*  --  453*   < > 575* 620*   < > = values in this interval not displayed.    Basic Metabolic Panel:  Recent Labs  Lab 02/02/19 1818 02/03/19 1024 02/04/19 0621  NA  --  151* 154*  K  --  3.4* 3.4*  CL  --  113* 113*  CO2  --  28 33*  GLUCOSE  --  207* 235*  BUN  --  32* 43*  CREATININE  --  0.72 0.83  CALCIUM  --  11.0* 10.9*  MG 1.7 1.8 2.0  PHOS 2.3* 2.3*  --    Lipid Panel:     Component Value Date/Time   CHOL 108 01/30/2019 0404   TRIG 264 (H) 01/30/2019 0404   HDL 23 (L) 01/30/2019 0404   CHOLHDL 4.7 01/30/2019 0404   VLDL 53 (H) 01/30/2019 0404   LDLCALC 32 01/30/2019 0404   HgbA1c:  Lab Results  Component Value Date   HGBA1C 6.1 (H) 01/30/2019   Urine Drug Screen: No results found for: LABOPIA, COCAINSCRNUR, LABBENZ, AMPHETMU, THCU, LABBARB  Alcohol Level No results found for: [facility]  IMAGING Dg Chest Port 1 View  Result Date: 02/04/2019 CLINICAL DATA:  Shortness of breath. EXAM: PORTABLE CHEST 1 VIEW COMPARISON:  Chest x-ray dated February 01, 2019. FINDINGS: The patient is rotated to the left, limiting evaluation. Interval placement of a feeding tube entering the stomach with the tip below the field of view.  Unchanged cardiomegaly. Normal pulmonary vascularity. Unchanged bibasilar opacities. No pneumothorax or large pleural effusion. No acute osseous abnormality. IMPRESSION: 1. Unchanged bibasilar airspace disease versus atelectasis. 2. Interval placement of a feeding tube with the tip below the field of view. Electronically Signed   By: Obie Dredge M.D.   On: 02/04/2019 13:49   2D Echocardiogram   1. The left ventricle has normal systolic function, with an ejection fraction of 55-60%. The cavity size was normal. Left ventricular diastolic Doppler parameters are consistent with impaired relaxation.  2. The right ventricle has normal systolic function. The cavity was normal. There is no increase in right ventricular wall thickness.  3. The mitral valve is normal in structure.  4. The tricuspid valve is normal in structure.  5. The aortic valve is normal in structure.  6. The aortic root  and ascending aorta are normal in size and structure.  7. Right atrial pressure is estimated at 8 mmHg.  8. The interatrial septum was not assessed.   PHYSICAL EXAM General: obese elderly Caucasian lady in mild respiratory distress HEENT: Normocephalic atraumatic dry mucous membranes Lungs: Clear to auscultation Cardiovascular: S1-S2 heard regular rate rhythm   Patient stuporose and barely opens eyes She does not follow commands. She makes contrast sounds and speaks occasional words but not sentences Pupils are 3mm and equal and reactive  extraocular movement exam shows inability to gaze to the left.  She cannot cross midline to look to the left.  No visual field cuts based on confrontation.  Complete left lower facial paralysis.  Palate midline.  Tongue midline. Motor exam: Right upper extremity in cast and difficult to examine   left hemiplegia with 1-2/5 strength.  withdraws right lower extremity. Sensory exam: grimaces to pain throughout . Coordination: Unable to perform   ASSESSMENT/PLAN Ms. Angelica HeritageVickie S  [name] is a 73 y.o. 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 In hospital developed confusion, left-sided facial droop, left arm and leg weakness and numbness.  She was not a TPA candidate secondary to recent surgery.  She was taken to IR, with unsuccessful revascularization.  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 ASPECTS 10.     CTA head & neck no LVO.  Severe R M2 stenosis.  Mild bilateral ICA stenosis.  Moderate RVA stenosis.  Aortic atherosclerosis.  CT perfusion R parietal ischemic penumbra without core infarct  MRI  Large acute R MCA infarct w/ mod edema, no shift  2D Echo  EF 55-60%. No source of embolus   LDL 32 on Lovaza  HgbA1c 6.1  no VTE prophylaxis.  Lovenox 40 mg subcu daily added Diet Order            Diet NPO time specified  Diet effective now              No antithrombotic prior to admission, now on No antithrombotic. Given large vessel intracranial atherosclerosis, patient should be treated with aspirin 81 mg and clopidogrel 75 mg orally every day x 3 months for secondary stroke prevention. After 3 months, change to plavix alone.   Therapy recommendations: CLR  Disposition:  pending   Acute respiratory failure  COPD  Intubated for attempted IR   CXR atx w/ prob small L effusion  Weaning underway. Plans for extubation today  PCCM following  Hypertension  Treated with cleviprex post IR, off  PTA: diltiazem 120 q 24h  Now on cardizem 30 q4  BP goal < 160 . 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 Renal US no hydro. Cr 0.93  COPD  Non union supracondylar humerus fracture, R - stable from surgeon standpoint  Hospital day #  10  Continue dual antiplatelet therapy.   Mobilize out of bed. Therapy and rehabilitation consults. No family available at the bedside for discussion. Discussed with Dr. Delrae Sawyers. Need to have discussion with family about goals of care and make patient DO NOT RESUSCITATE and comfort care. If family wants full support will need to order repeat CT scan and move her back to the intensive care unit and need likely prolonged intubation and trach and peg which would not be advisable given his significant disability from large disabling stroke.  . Greater than 50% time during this 25 minute visit was spent on counseling and coordination of care about her stroke and discussion about plan of care and answering questions     [facility] See Amion for Schedule & Pager information 02/04/2019 2:59 PM      To contact Stroke Continuity provider, please refer to [email or web address]. After hours, contact General Neurology

## 2019-02-04 NOTE — Progress Notes (Signed)
PROGRESS NOTE    Angelica [name]  WVP:[id number] DOB: 09/24/1946 DOA: 01/30/2019 PCP: Joette CatchingNyland, Leonard, MD    Brief Narrative:  73 year old female with extensive past medical history includes COPD, hypertension, diabetes, dyslipidemia and coronary disease, recent right humeral fracture who was found to have acute CVA, right parietal lobe, underwent right common carotid arteriogram followed by revascularization, complicated by postoperative respiratory failure.  She spent 3 days on invasive mechanical ventilation, she was extubated to nasal cannula, she was found to have swallow dysfunction and an NG tube was placed for feedings.  Condition has been deteriorating in terms of her mentation, this morning in frank respiratory distress.  Intensive care has been consulted to transfer back to the ICU, patient will need invasive mechanical ventilation.  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 Acute respiratory failure. Patient this am with significant increased work of breathing, positive accessory muscle use and decreased mentation. Stat blood gas with pH at 7.41 with Pco 54 and Pa0 78. Will do stat chest film. Will be challenging non invasive mechanical ventilation due to significant altered mentation, her Glasgow coma scale is close to 3. I spoke with patient's daughter and explained patient's critical condition. Will consult critical care, patient will need invasive mechanical ventilation. For now will stop NG feedings and will place patient on broad spectrum antibiotic, suspecting aspiration pneumonia.   I discuss the case with Dr. Denese KillingsAgarwala, from the critical care team, patient will need invasive mechanical  ventilation to prevent acute deterioration.   2. Large right MCA CVA sp revascularization. Patient with significant decreased mentation, now with Glasgow coma scale close to 3. Now with very poor prognosis.    3. HTN. Currently patient is off antihypertensive agents, will continue close monitoring.   4. Hypernatremia. Worsening hyperNatremia with serum Na up to 154, with Cl at 113 and serum bicarbonate at 33. Renal function continue to be preserved with serum cr at 0,83. Will increase free water flushed per ng and follow on renal panel in am.   5. T2DM. For now will hold on tube feedings, continue glucose cover and monitoring with insulin sliding scale. Fasting glucose this am is 235.   6. Right humeral fracture. Immobilized. Not in apparent pain.   7. Anemia. Hb and hct have been stable at 8.7 and 29.9.  8. Obesity. Calculated BMI 37,49.   Patient is critically with worsening encephalopathy, respiratory failure and imminent risk for deterioration.   Critical care time 60 minutes.   DVT prophylaxis: enoxaparin   Code Status:  full Family Communication: I spoke with patient's daughter at the bedside and all questions were addressed.  Disposition Plan/ discharge barriers: Critically ill   Body mass index is 37.49 kg/m. Malnutrition Type:  Nutrition Problem: Increased nutrient needs Etiology: post-op healing   Malnutrition Characteristics:  Signs/Symptoms: estimated needs   Nutrition Interventions:  Interventions: Tube feeding  RN Pressure Injury Documentation:     Consultants:   Critical Care  Neurology   Procedures:     Antimicrobials:   Unasyn.     Subjective: Patient is not responsive, positive respiratory distress, not able to get any further history from patient. Per her daughter  over the phone patient's mentation has been worsening, along with her respiratory condition.   Objective: Vitals:   02/03/19 2029 02/04/19 0502 02/04/19 0714 02/04/19  0716  BP:      Pulse: 90     Resp:      Temp:  98.6 F (37 C)    TempSrc:  Axillary    SpO2: 98%  98% 98%  Weight:      Height:        Intake/Output Summary (Last 24 hours) at 02/04/2019 1211 Last data filed at 02/04/2019 0300 Gross per 24 hour  Intake 1194.17 ml  Output -  Net 1194.17 ml   Filed Weights   02/07/2019 1139 02/02/19 0500  Weight: 86 kg 90 kg    Examination:   General: respiratory distress  Neurology: Unresponsive with glasgow coma scale 3-4.   E ENT: positive pallor, no icterus, oral mucosa moist Cardiovascular: No JVD. S1-S2 present, rhythmic, no gallops, rubs, or murmurs.  Positive lower extremity edema. Pulmonary: positive breath sounds bilaterally, no wheezing, but significant diffuse bilateral rhonchi. Gastrointestinal. Abdomen protuberant with paradoxical movement, no organomegaly, no rebound or guarding Skin. No rashes Musculoskeletal: no joint deformities     Data Reviewed: I have personally reviewed following labs and imaging studies  CBC: Recent Labs  Lab 01/22/2019 0835  02/12/2019 1230  01/30/19 0404  01/31/19 0403  02/01/19 0050 02/01/19 0656 02/02/19 1122 02/03/19 1024 02/04/19 0621  WBC 9.3  --  12.1*   < > 9.1  --  8.8  --   --  8.6 8.2 9.8 12.6*  NEUTROABS 7.6  --  9.8*  --  7.5  --  7.1  --   --   --   --   --   --   HGB 6.1*   < > 7.0*   < > 7.9*   < > 7.7*   < > 9.9* 8.0* 7.8* 8.0* 8.7*  HCT 20.2*   < > 22.9*   < > 25.4*   < > 24.6*   < > 29.0* 25.4* 26.6* 26.2* 29.9*  MCV 99.5  --  99.1   < > 97.7  --  96.9  --   --  97.7 100.4* 99.2 100.7*  PLT 372  --  469*   < > 443*  --  453*  --   --  425* 542* 575* 620*   < > = values in this interval not displayed.   Basic Metabolic Panel: Recent Labs  Lab 01/31/19 0403 01/31/19 1650  02/01/19 0050 02/01/19 0656 02/02/19 1122 02/02/19 1818 02/03/19 1024 02/04/19 0621  NA 142  --    < > 144 145 146*  --  151* 154*  K 4.0  --    < > 3.4* 3.7 3.4*  --  3.4* 3.4*  CL 112*  --   --    --  110 109  --  113* 113*  CO2 22  --   --   --  22 23  --  28 33*  GLUCOSE 151*  --   --   --  102* 156*  --  207* 235*  BUN 23  --   --   --  20 22  --  32* 43*  CREATININE 0.93  --   --   --  0.90 0.96  --  0.72 0.83  CALCIUM 8.4*  --   --   --  9.5 10.2  --  11.0* 10.9*  MG  2.0 1.9  --   --  1.7 1.8 1.7 1.8 2.0  PHOS 3.6 3.3  --   --  3.0 2.6 2.3* 2.3*  --    < > = values in this interval not displayed.   GFR: Estimated Creatinine Clearance: 61.7 mL/min (by C-G formula based on SCr of 0.83 mg/dL). Liver Function Tests: Recent Labs  Lab 02/04/19 0621  AST 18  ALT 17  ALKPHOS 53  BILITOT 1.0  PROT 6.9  ALBUMIN 2.8*   No results for input(s): LIPASE, AMYLASE in the last 168 hours. No results for input(s): AMMONIA in the last 168 hours. Coagulation Profile: Recent Labs  Lab 01-30-2019 0835 January 30, 2019 1230  INR 1.24 1.18   Cardiac Enzymes: No results for input(s): CKTOTAL, CKMB, CKMBINDEX, TROPONINI in the last 168 hours. BNP (last 3 results) No results for input(s): PROBNP in the last 8760 hours. HbA1C: No results for input(s): HGBA1C in the last 72 hours. CBG: Recent Labs  Lab 02/03/19 2010 02/03/19 2317 02/04/19 0503 02/04/19 0847 02/04/19 1130  GLUCAP 208* 193* 214* 260* 263*   Lipid Profile: No results for input(s): CHOL, HDL, LDLCALC, TRIG, CHOLHDL, LDLDIRECT in the last 72 hours. Thyroid Function Tests: No results for input(s): TSH, T4TOTAL, FREET4, T3FREE, THYROIDAB in the last 72 hours. Anemia Panel: No results for input(s): VITAMINB12, FOLATE, FERRITIN, TIBC, IRON, RETICCTPCT in the last 72 hours.    Radiology Studies: I have reviewed all of the imaging during this hospital visit personally     Scheduled Meds: .  stroke: mapping our early stages of recovery book   Does not apply Once  . aspirin  81 mg Per Tube Daily  . bisoprolol  5 mg Per Tube Daily  . budesonide (PULMICORT) nebulizer solution  0.5 mg Nebulization BID  . chlorhexidine  15  mL Mouth Rinse BID  . clopidogrel  75 mg Per Tube Daily  . diltiazem  60 mg Per Tube Q6H  . docusate  100 mg Per Tube BID  . DULoxetine  60 mg Oral Daily  . enoxaparin (LOVENOX) injection  40 mg Subcutaneous Q24H  . feeding supplement (PRO-STAT SUGAR FREE 64)  30 mL Per Tube BID  . free water  200 mL Per Tube Q8H  . insulin aspart  0-9 Units Subcutaneous Q4H  . ipratropium-albuterol  3 mL Nebulization TID  . mouth rinse  15 mL Mouth Rinse q12n4p  . omega-3 acid ethyl esters  2 g Per Tube BID  . pantoprazole sodium  40 mg Per Tube Daily  . vitamin C  1,000 mg Per Tube Daily   Continuous Infusions: . feeding supplement (JEVITY 1.2 CAL) 1,000 mL (02/04/19 0547)     LOS: 10 days         Annett Gula, MD

## 2019-02-04 NOTE — Progress Notes (Signed)
OT Cancellation Note  Patient Details Name: Angelica [name] MRN: [id number] DOB: Aug 22, 1946   Cancelled Treatment:    Reason Eval/Treat Not Completed: Medical issues which prohibited therapy.  Pt with decreased responsiveness, and change in respiratory status.  Will cancel today and monitor for continued appropriateness of OT.  Jeani Hawking, OTR/L Acute Rehabilitation Services Pager [phone] Office [phone]   Jeani Hawking M 02/04/2019, 1:57 PM

## 2019-02-04 NOTE — Progress Notes (Signed)
Pharmacy Antibiotic Note  Angelica [name] is a 73 y.o. female admitted on 01/22/2019 with CVA, now with suspected aspiration PNA.  Pharmacy has been consulted for Unasyn dosing.  CC/HPI: 75 YOF who presented on 2/7 for planned surgery on R-elbow/humerus  PMH: COPD, CAD, DM, HTN, asthma, HLD, recent right humeral fracture   Significant events: underwent right common carotid arteriogram followed by revascularization, complicated by postoperative respiratory failure.  She spent 3 days on invasive mechanical ventilation,   ID: Aspiration PNA. Afebrile. WBC 12.6. Worsening ventilation.  Unasyn 2/17>>  2/10 UC - neg   Plan: Unasyn 3g IV q 8 hrs    Height: 5\' 1"  (154.9 cm) Weight: 198 lb 6.6 oz (90 kg) IBW/kg (Calculated) : 47.8  Temp (24hrs), Avg:98.7 F (37.1 C), Min:98.6 F (37 C), Max:98.7 F (37.1 C)  Recent Labs  Lab 02-01-19 1443  01/30/19 0404 01/31/19 0403 02/01/19 0656 02/02/19 1122 02/03/19 1024 02/04/19 0621  WBC  --    < > 9.1 8.8 8.6 8.2 9.8 12.6*  CREATININE  --   --  1.23* 0.93 0.90 0.96 0.72 0.83  LATICACIDVEN 0.6  --  0.6  --   --   --   --   --    < > = values in this interval not displayed.    Estimated Creatinine Clearance: 61.7 mL/min (by C-G formula based on SCr of 0.83 mg/dL).    Allergies  Allergen Reactions  . Bee Venom Anaphylaxis  . Levaquin [Levofloxacin] Anaphylaxis and Itching  . Amoxicillin-Pot Clavulanate Other (See Comments)    Upset GI Did it involve swelling of the face/tongue/throat, SOB, or low BP? No Did it involve sudden or severe rash/hives, skin peeling, or any reaction on the inside of your mouth or nose? No Did you need to seek medical attention at a hospital or doctor's office? No When did it last happen?10-20 years If all above answers are "NO", may proceed with cephalosporin use.   Marland Kitchen Morphine And Related Rash  . Rocephin [Ceftriaxone Sodium In Dextrose] Rash and Other (See Comments)  . Latex Itching and Swelling   . Quinine Derivatives     Decreased platelet counts   Denario Bagot S. Merilynn Finland, PharmD, BCPS Clinical Staff Pharmacist Misty Stanley Stillinger 02/04/2019 1:57 PM

## 2019-02-04 NOTE — Progress Notes (Addendum)
Inpatient Rehabilitation Admissions Coordinator  I met with patient at bedside with her RN. Pt opened eyes for a short time, but not following commands. I will contact her daughter to discuss concerning her rehab venue options when appropriate. Noted plans to admit to ICU today.  Danne Baxter, RN, MSN Rehab Admissions Coordinator [phone] 02/04/2019 12:53 PM

## 2019-02-04 NOTE — Procedures (Signed)
Intubation Procedure Note  GRASIELA HUSSONG  [id number] 1946-03-22   Procedure: Intubation Indications: Airway protection and maintenance  Procedure Details Consent: Risks of procedure as well as the alternatives and risks of each were explained to the (patient/caregiver).  Consent for procedure obtained. Time Out: Verified patient identification, verified procedure, site/side was marked, verified correct patient position, special equipment/implants available, medications/allergies/relevent history reviewed, required imaging and test results available.  Performed  Pre-oxygenation: 100% via  Premedication: Fentanyl 50, versed 2 Position: supine with ramp Induction agent: etomidate 16 Paralytic: succinylcholine 100 Technique: Glidescope 4 Tube size: 7.5 Laryngoscopy view: grade 1 Number of attempts: 1 Insertion depth: 20cm Tube secured: tube holder Other findings: copious dry secretions.   OGT/NGT inserted: Cortrak Position confirmed by auscultation: yes  Evaluation Colorimetric change: yes Bilateral breath sounds: yes  Hemodynamic Status: BP stable throughout; O2 sats: stable throughout Patient's Current Condition: stable Complications: No apparent complications Patient did tolerate procedure well. Chest X-ray ordered to verify placement.  CXR: pending.   Daleen Bo Kineta Fudala 10/31/2018

## 2019-02-04 NOTE — Progress Notes (Signed)
Report given to nurse on 4N. Patient will be transferred to 4N23.

## 2019-02-04 NOTE — Progress Notes (Signed)
PT Cancellation Note  Patient Details Name: Angelica [name] MRN: [id number] DOB: 1946-12-13   Cancelled Treatment:    Reason Eval/Treat Not Completed: Medical issues which prohibited therapy.  Pt with decreased responsiveness, and change in respiratory status.  Will cancel today and monitor for continued appropriateness of PT. 02/04/2019  [address] Bing, PT Acute Rehabilitation Services [phone]  (pager) [phone]  (office)   Angelica [name] 02/04/2019, 2:54 PM

## 2019-02-04 NOTE — Progress Notes (Signed)
NAME:  Angelica [name], MRN:  [id number], DOB:  Nov 20, 1946, LOS: 10 ADMISSION DATE:  02/01/2019, CONSULTATION DATE:  02-14-19 REFERRING MD:  Wilford Corner CHIEF COMPLAINT:   Brief History   73 year old female past medical history of tobacco abuse, 30 pack years, no quit date, diabetes, coronary disease, COPD, hypertension and hyperlipidemia who recently underwent open reduction internal fixation of the right upper extremity humeral fracture, who developed sudden onset of left facial droop 2/11, dysarthria and left hemiparesis . CTA brain revealed scan positive for severe proximal right M2 stenosis. Right parietal lobe ischemic penumbra without core infarct evident on CTP. Mild bilateral intracranial ICA stenoses. She underwent RT common carotid arteriogram followed by re-vascularization of occluded branch of the RT MCA sup division achieving a TICI  3 revascularization with reocclusion due to underlying arteriosclerosis. PCCM have been consulted for vent management in patient with known history of COPD.  Past Medical History  HTN Asthma  COPD  Arthritis  DM  CAD   Significant Hospital Events   2/07 Admit for ORIF of R humeral fracture  2/11 Developed facial droop, R MCA embolism to IR  2/15 Tx to neurology floor  2/17 PCCM called back for change in mental status, labored breathing    Consults:  PCCM 2/11   Procedures:  2/11 Right common carotid arteriogram followed by revascularization of occluded branch of the RT MCA sup division achieving a TICI  3 revascularization with reocclusion due to underlying arteriosclerosis.Marland Kitchen Rescue stenting not performed due the small diameter of the the involved branch,and potential increased risk of ICH with adjuvant dual antiplatelets  Significant Diagnostic Tests:  CT Angio Head 2/11 >> Negative for large vessel occlusion but positive for severe proximal right M2 stenosis. Right parietal lobe ischemic penumbra without core infarct evident on CTP. Mild bilateral  intracranial ICA stenoses. No cervical carotid artery stenosis. Moderate right vertebral artery origin stenosis. Aortic Atherosclerosis. Post Procedure Ct Brain  2/11 >> No ICH or mass effect   Micro Data:  MRSA PCR 2/11 >> Negative Urine Culture 2/10 >> negative  Antimicrobials:     Interim history/subjective:  Called back for increased work of breathing.  RN cleaning patient after episode of urinary incontinence.  Pt less alert than 2/15 evaluation.  Family at bedside. Afebrile.  I/O-1L positive balance since admit.   Objective   Blood pressure (!) 187/74, pulse 90, temperature 98.6 F (37 C), temperature source Axillary, resp. rate (!) 22, height 5\' 1"  (1.549 m), weight 90 kg, SpO2 96 %.        Intake/Output Summary (Last 24 hours) at 02/04/2019 1510 Last data filed at 02/04/2019 0300 Gross per 24 hour  Intake 726.67 ml  Output -  Net 726.67 ml   Filed Weights   01/28/2019 1139 02/02/19 0500  Weight: 86 kg 90 kg   Physical Exam: General: ill appearing elderly female lying in bed HEENT: MM pink/moist, dried crusting on lips, small bore feeding tube in place, left sided facial droop  Neuro: dense left hemiparesis, moans to voice, spontaneous movement of RUE/RLE, no follow commands   CV: s1s2 rrr, no m/r/g PULM: abdominal breathing / labored but not distressed, upper airway secretions  OQ:HUTM, non-tender, bsx4 active  Extremities: warm/dry, trace generalized edema  Skin: no rashes or lesions  Resolved Hospital Problem list   Hypotension secondary to sedation Acute renal failure  Assessment & Plan:   Acute Respiratory Distress in setting of Encephalopathy & suspected ongoing micro Aspiration secondary to CVA Presumed  COPD (no PFTs on file) P: Transfer back to ICU  May require intubation for airway protection  ABG, CXR reviewed > based on these alone she does not need ETT Follow intermittent CXR Continue Duoneb + Pulmicort  Vent orders placed Lasix 40 mg IV x1    Decadron for possible upper airway contribution   Large Right MCA Stroke s/p Revascularization of occluded branch of the RT MCA sup division P: Per Neurology  PT/OT efforts  Continue ASA, Plavix  Dysphagia  -failed swallow evaluation, coretrak in place P: SLP following  NPO   Hypertension P: Continue cardizem, bisoprolol PT   Iron Deficiency Anemia -s/p 1 U PRBC 2/11, no acute bleeding P: Trend CBC  Transfuse per ICU guidelines  NIDDM P: SSI Q4  Nonunion supracondylar humerus fracture, right upper extremity P: Per Ortho > soft cast in place  Monitor neurovascular checks of RUE  Best practice:  Diet: NPO  Pain/Anxiety/Delirium protocol (if indicated): N/A VAP protocol (if indicated): N/A DVT prophylaxis: SCD's hose  GI prophylaxis: Protonix Glucose control: CBG Q 4 with SSI Mobility: BR Code Status: Full Family Communication: Daughter, sisters x2 updated at bedside by Dr. Denese Killings.  Disposition: ICU  Labs   CBC: Recent Labs  Lab 02/13/2019 0835  02/14/2019 1230  01/30/19 0404  01/31/19 0403  02/01/19 0050 02/01/19 0656 02/02/19 1122 02/03/19 1024 02/04/19 0621  WBC 9.3  --  12.1*   < > 9.1  --  8.8  --   --  8.6 8.2 9.8 12.6*  NEUTROABS 7.6  --  9.8*  --  7.5  --  7.1  --   --   --   --   --   --   HGB 6.1*   < > 7.0*   < > 7.9*   < > 7.7*   < > 9.9* 8.0* 7.8* 8.0* 8.7*  HCT 20.2*   < > 22.9*   < > 25.4*   < > 24.6*   < > 29.0* 25.4* 26.6* 26.2* 29.9*  MCV 99.5  --  99.1   < > 97.7  --  96.9  --   --  97.7 100.4* 99.2 100.7*  PLT 372  --  469*   < > 443*  --  453*  --   --  425* 542* 575* 620*   < > = values in this interval not displayed.    Basic Metabolic Panel: Recent Labs  Lab 01/31/19 0403 01/31/19 1650  02/01/19 0050 02/01/19 0656 02/02/19 1122 02/02/19 1818 02/03/19 1024 02/04/19 0621  NA 142  --    < > 144 145 146*  --  151* 154*  K 4.0  --    < > 3.4* 3.7 3.4*  --  3.4* 3.4*  CL 112*  --   --   --  110 109  --  113* 113*  CO2 22   --   --   --  22 23  --  28 33*  GLUCOSE 151*  --   --   --  102* 156*  --  207* 235*  BUN 23  --   --   --  20 22  --  32* 43*  CREATININE 0.93  --   --   --  0.90 0.96  --  0.72 0.83  CALCIUM 8.4*  --   --   --  9.5 10.2  --  11.0* 10.9*  MG 2.0 1.9  --   --  1.7  1.8 1.7 1.8 2.0  PHOS 3.6 3.3  --   --  3.0 2.6 2.3* 2.3*  --    < > = values in this interval not displayed.   GFR: Estimated Creatinine Clearance: 61.7 mL/min (by C-G formula based on SCr of 0.83 mg/dL). Recent Labs  Lab 01/22/2019 1443  01/30/19 0404  02/01/19 0656 02/02/19 1122 02/03/19 1024 02/04/19 0621  WBC  --    < > 9.1   < > 8.6 8.2 9.8 12.6*  LATICACIDVEN 0.6  --  0.6  --   --   --   --   --    < > = values in this interval not displayed.    Liver Function Tests: Recent Labs  Lab 02/04/19 0621  AST 18  ALT 17  ALKPHOS 53  BILITOT 1.0  PROT 6.9  ALBUMIN 2.8*   No results for input(s): LIPASE, AMYLASE in the last 168 hours. No results for input(s): AMMONIA in the last 168 hours.  ABG    Component Value Date/Time   PHART 7.417 02/04/2019 1240   PCO2ART 54.8 (H) 02/04/2019 1240   PO2ART 78.1 (L) 02/04/2019 1240   HCO3 34.7 (H) 02/04/2019 1240   TCO2 28 02/01/2019 0050   ACIDBASEDEF 5.0 (H) 01/30/2019 0439   O2SAT 95.8 02/04/2019 1240     Coagulation Profile: Recent Labs  Lab 01/28/2019 0835 01/28/2019 1230  INR 1.24 1.18    Cardiac Enzymes: No results for input(s): CKTOTAL, CKMB, CKMBINDEX, TROPONINI in the last 168 hours.  HbA1C: Hgb A1c MFr Bld  Date/Time Value Ref Range Status  01/30/2019 04:04 AM 6.1 (H) 4.8 - 5.6 % Final    Comment:    (NOTE) Pre diabetes:          5.7%-6.4% Diabetes:              >6.4% Glycemic control for   <7.0% adults with diabetes   02/07/2019 10:55 PM 6.4 (H) 4.8 - 5.6 % Final    Comment:    (NOTE) Pre diabetes:          5.7%-6.4% Diabetes:              >6.4% Glycemic control for   <7.0% adults with diabetes     CBG: Recent Labs  Lab  02/03/19 2010 02/03/19 2317 02/04/19 0503 02/04/19 0847 02/04/19 1130  GLUCAP 208* 193* 214* 260* 263*   Canary BrimBrandi Sheriden Archibeque, NP-C [facility] Pgr: [phone] or if no answer [phone] 02/04/2019, 3:10 PM

## 2019-02-04 NOTE — Progress Notes (Signed)
SLP Cancellation Note  Patient Details Name: Angelica [name] MRN: [id number] DOB: [birth year]   Cancelled treatment:       Reason Eval/Treat Not Completed: Fatigue/lethargy limiting ability to participate.  Pt opened eyes to name, but otherwise not sufficiently arousable for PO trials. Tachypneic, audible secretions.  SLP will follow closely for readiness for instrumental swallow study.  Adell Koval L. Samson Frederic, MA CCC/SLP Acute Rehabilitation Services Office number [phone] Pager [phone]    Blenda Mounts Laurice 02/04/2019, 12:06 PM

## 2019-02-05 ENCOUNTER — Inpatient Hospital Stay: Payer: Medicare Other | Admitting: Family

## 2019-02-05 ENCOUNTER — Inpatient Hospital Stay (HOSPITAL_COMMUNITY): Payer: Medicare Other

## 2019-02-05 ENCOUNTER — Inpatient Hospital Stay: Payer: Medicare Other

## 2019-02-05 DIAGNOSIS — Z515 Encounter for palliative care: Secondary | ICD-10-CM

## 2019-02-05 DIAGNOSIS — I639 Cerebral infarction, unspecified: Secondary | ICD-10-CM

## 2019-02-05 DIAGNOSIS — Z7189 Other specified counseling: Secondary | ICD-10-CM

## 2019-02-05 DIAGNOSIS — J9600 Acute respiratory failure, unspecified whether with hypoxia or hypercapnia: Secondary | ICD-10-CM

## 2019-02-05 LAB — GLUCOSE, CAPILLARY
GLUCOSE-CAPILLARY: 350 mg/dL — AB (ref 70–99)
GLUCOSE-CAPILLARY: 317 mg/dL — AB (ref 70–99)
Glucose-Capillary: 323 mg/dL — ABNORMAL HIGH (ref 70–99)
Glucose-Capillary: 316 mg/dL — ABNORMAL HIGH (ref 70–99)
Glucose-Capillary: 277 mg/dL — ABNORMAL HIGH (ref 70–99)
Glucose-Capillary: 315 mg/dL — ABNORMAL HIGH (ref 70–99)

## 2019-02-05 LAB — CBC WITH DIFFERENTIAL/PLATELET
Abs Immature Granulocytes: 0.27 10*3/uL — ABNORMAL HIGH (ref 0.00–0.07)
Basophils Absolute: 0 10*3/uL (ref 0.0–0.1)
Basophils Relative: 0 %
Eosinophils Absolute: 0 10*3/uL (ref 0.0–0.5)
Eosinophils Relative: 0 %
HCT: 26.6 % — ABNORMAL LOW (ref 36.0–46.0)
Hemoglobin: 7.5 g/dL — ABNORMAL LOW (ref 12.0–15.0)
Immature Granulocytes: 2 %
LYMPHS PCT: 8 %
Lymphs Abs: 1 10*3/uL (ref 0.7–4.0)
MCH: 29.3 pg (ref 26.0–34.0)
MCHC: 28.2 g/dL — ABNORMAL LOW (ref 30.0–36.0)
MCV: 103.9 fL — ABNORMAL HIGH (ref 80.0–100.0)
Monocytes Absolute: 1.3 10*3/uL — ABNORMAL HIGH (ref 0.1–1.0)
Monocytes Relative: 10 %
NEUTROS ABS: 10.2 10*3/uL — AB (ref 1.7–7.7)
Neutrophils Relative %: 80 %
Platelets: 580 10*3/uL — ABNORMAL HIGH (ref 150–400)
RBC: 2.56 MIL/uL — ABNORMAL LOW (ref 3.87–5.11)
RDW: 14.7 % (ref 11.5–15.5)
WBC: 12.8 10*3/uL — ABNORMAL HIGH (ref 4.0–10.5)
nRBC: 0.8 % — ABNORMAL HIGH (ref 0.0–0.2)

## 2019-02-05 LAB — BASIC METABOLIC PANEL
Anion gap: 7 (ref 5–15)
BUN: 50 mg/dL — ABNORMAL HIGH (ref 8–23)
CHLORIDE: 111 mmol/L (ref 98–111)
CO2: 36 mmol/L — ABNORMAL HIGH (ref 22–32)
Calcium: 10.5 mg/dL — ABNORMAL HIGH (ref 8.9–10.3)
Creatinine, Ser: 0.92 mg/dL (ref 0.44–1.00)
GFR calc Af Amer: >60 mL/min (ref >60)
GFR calc non Af Amer: >60 mL/min (ref >60)
Glucose, Bld: 355 mg/dL — ABNORMAL HIGH (ref 70–99)
POTASSIUM: 3.6 mmol/L (ref 3.5–5.1)
Sodium: 154 mmol/L — ABNORMAL HIGH (ref 135–145)

## 2019-02-05 LAB — TRIGLYCERIDES: Triglycerides: 336 mg/dL — ABNORMAL HIGH (ref <150)

## 2019-02-05 MED ORDER — PANCRELIPASE (LIP-PROT-AMYL) 10440-39150 UNITS PO TABS
20880.0000 [IU] | ORAL_TABLET | Freq: Once | ORAL | Status: DC
  Filled 2019-02-05: qty 2

## 2019-02-05 MED ORDER — INSULIN DETEMIR 100 UNIT/ML ~~LOC~~ SOLN
10.0000 [IU] | Freq: Two times a day (BID) | SUBCUTANEOUS | Status: DC
  Administered 2019-02-05 – 2019-02-06 (×3): 10 [IU] via SUBCUTANEOUS
  Filled 2019-02-05 (×3): qty 0.1

## 2019-02-05 MED ORDER — FREE WATER
250.0000 mL | Freq: Four times a day (QID) | Status: DC
  Administered 2019-02-06 (×2): 250 mL

## 2019-02-05 MED ORDER — SODIUM BICARBONATE 650 MG PO TABS
650.0000 mg | ORAL_TABLET | Freq: Once | ORAL | Status: DC
  Filled 2019-02-05: qty 1

## 2019-02-05 NOTE — Progress Notes (Signed)
Pt transported from 4N23 to CT and back without incident. 

## 2019-02-05 NOTE — Progress Notes (Signed)
Inpatient Diabetes Program Recommendations  AACE/ADA: New Consensus Statement on Inpatient Glycemic Control (2015)  Target Ranges:  Prepandial:   less than 140 mg/dL      Peak postprandial:   less than 180 mg/dL (1-2 hours)      Critically ill patients:  140 - 180 mg/dL   Lab Results  Component Value Date   GLUCAP 316 (H) 01/31/2019   HGBA1C 6.1 (H) 01/30/2019    Review of Glycemic Control Results for Angelica [name], Angelica [name] (MRN [id number]) as of 01/23/2019 15:19  Ref. Range 02/04/2019 20:09 02/04/2019 23:47 01/25/2019 04:01 01/26/2019 08:02 02/09/2019 11:44  Glucose-Capillary Latest Ref Range: 70 - 99 mg/dL 998 (H) 338 (H) 250 (H) 323 (H) 316 (H)  Outpatient Diabetes medications: Metformin 1000 mg BID, Januvia 100 mg daily, Glipizide XL 10 mg QHS as needed if glucose >150 mg/dl Current orders for Inpatient glycemic control: Novolog 0-9 units Q4H  Inpatient Diabetes Program Recommendations: Insulin - Meal Coverage: Please consider ordering Novolog 3 units Q4H for tube feeding coverage. If tube feeding is stopped or held, then Novolog tube feeding coverage should also be stopped or held.  If blood sugars continue to be > goal, may need ICU glycemic control order/IV insulin set if appropriate.    Thanks,  Beryl Meager, RN, BC-ADM Inpatient Diabetes Coordinator Pager [phone] (8a-5p)

## 2019-02-05 NOTE — Progress Notes (Signed)
Inpatient Rehabilitation Admissions Coordinator  Noted plans as outlined . We will sign off at this time.  Ottie Glazier, RN, MSN Rehab Admissions Coordinator [phone] 02/15/2019 8:32 PM

## 2019-02-05 NOTE — Progress Notes (Addendum)
STROKE TEAM PROGRESS NOTE   INTERVAL HISTORY  .she was intubated yesterday and transferred to intensive care unit. She is presently on ventilatory support. She is arousable but not following commands. She has purposeful right sided movement. She had CT scan of the head this morning shows evolving large subacute right MCA infarct without significant midline shift or change compared with previous CT from 01/31/19  Vitals:   01/20/2019 1000 01/30/2019 1100 02/04/2019 1200 01/27/2019 1240  BP: (!) 163/52 (!) 158/74 105/71 95/69  Pulse:  97 76 76  Resp:   (!) 22 (!) 23  Temp:   99.3 F (37.4 C)   TempSrc:   Axillary   SpO2:  99% 98% 100%  Weight:      Height:        CBC:  Recent Labs  Lab 01/31/19 0403  02/04/19 0621 02/04/19 2020 02/12/2019 0807  WBC 8.8   < > 12.6*  --  12.8*  NEUTROABS 7.1  --   --   --  10.2*  HGB 7.7*   < > 8.7* 9.2* 7.5*  HCT 24.6*   < > 29.9* 27.0* 26.6*  MCV 96.9   < > 100.7*  --  103.9*  PLT 453*   < > 620*  --  580*   < > = values in this interval not displayed.    Basic Metabolic Panel:  Recent Labs  Lab 02/02/19 1818 02/03/19 1024 02/04/19 0621 02/04/19 2020 02/14/2019 0807  NA  --  151* 154* 154* 154*  K  --  3.4* 3.4* 3.7 3.6  CL  --  113* 113*  --  111  CO2  --  28 33*  --  36*  GLUCOSE  --  207* 235*  --  355*  BUN  --  32* 43*  --  50*  CREATININE  --  0.72 0.83  --  0.92  CALCIUM  --  11.0* 10.9*  --  10.5*  MG 1.7 1.8 2.0  --   --   PHOS 2.3* 2.3*  --   --   --    Lipid Panel:     Component Value Date/Time   CHOL 108 01/30/2019 0404   TRIG 336 (H) 02/04/2019 2325   HDL 23 (L) 01/30/2019 0404   CHOLHDL 4.7 01/30/2019 0404   VLDL 53 (H) 01/30/2019 0404   LDLCALC 32 01/30/2019 0404   HgbA1c:  Lab Results  Component Value Date   HGBA1C 6.1 (H) 01/30/2019   Urine Drug Screen: No results found for: LABOPIA, COCAINSCRNUR, LABBENZ, AMPHETMU, THCU, LABBARB  Alcohol Level [facility] results found for: [facility]  [facility] Ct Head Wo Contrast  Result  Date: 01/30/2019 CLINICAL DATA:  Altered mental status. Recent right MCA infarct. EXAM: CT HEAD WITHOUT CONTRAST TECHNIQUE: Contiguous axial images were obtained from the base of the skull through the vertex without intravenous contrast. COMPARISON:  Brain MRI 01/30/2019 FINDINGS: Brain: There is a large well-defined region of cytotoxic edema corresponding to the acute MCA infarct shown on the prior MRI without evidence of interval infarct enlargement. This involves the insula, parietal lobe, and posterior frontal lobe including frontoparietal operculum. There is progressive sulcal effacement in this area but no midline shift, and there is at most trace petechial hemorrhage. A small infarct is also present in the right caudate nucleus, unchanged. No new infarct, or extra-axial fluid collection is identified. There is mild cerebral atrophy. Vascular: Calcified atherosclerosis at the skull base. No hyperdense vessel. Skull: No fracture or focal  osseous lesion. Sinuses/Orbits: Visualized paranasal sinuses are clear. There are persistent bilateral mastoid effusions. Bilateral cataract extraction is noted. Other: None. IMPRESSION: Evolving large subacute right MCA infarct with at most trace petechial hemorrhage. No malignant hemorrhagic transformation, infarct enlargement, or midline shift. Electronically Signed   By: Sebastian Ache M.D.   On: 02/11/2019 11:04   Dg Chest Port 1 View  Result Date: 02/04/2019 CLINICAL DATA:  73 year old female with a history of stroke EXAM: PORTABLE CHEST 1 VIEW COMPARISON:  02/04/2019 FINDINGS: Cardiomediastinal silhouette unchanged in size and contour. Low lung volumes with patchy opacities at the lung bases. No pneumothorax. Interval placement of right subclavian central venous catheter with the tip appearing to terminate superior vena cava/cavoatrial junction. Unchanged enteric tube which terminates out of the field of view. Interval placement of endotracheal tube, terminating 3.1 cm  above the carina IMPRESSION: Interval placement of endotracheal tube terminating above the carina approximately 3.1 cm. Low lung volumes with likely basilar atelectasis/consolidation. Interval placement of right subclavian central venous catheter without pneumothorax. Unchanged enteric tube Electronically Signed   By: Gilmer Mor D.O.   On: 02/04/2019 19:06   Dg Chest Port 1 View  Result Date: 02/04/2019 CLINICAL DATA:  Shortness of breath. EXAM: PORTABLE CHEST 1 VIEW COMPARISON:  Chest x-ray dated February 01, 2019. FINDINGS: The patient is rotated to the left, limiting evaluation. Interval placement of a feeding tube entering the stomach with the tip below the field of view. Unchanged cardiomegaly. Normal pulmonary vascularity. Unchanged bibasilar opacities. No pneumothorax or large pleural effusion. No acute osseous abnormality. IMPRESSION: 1. Unchanged bibasilar airspace disease versus atelectasis. 2. Interval placement of a feeding tube with the tip below the field of view. Electronically Signed   By: Obie Dredge M.D.   On: 02/04/2019 13:49   2D Echocardiogram   1. The left ventricle has normal systolic function, with an ejection fraction of 55-60%. The cavity size was normal. Left ventricular diastolic Doppler parameters are consistent with impaired relaxation.  2. The right ventricle has normal systolic function. The cavity was normal. There is no increase in right ventricular wall thickness.  3. The mitral valve is normal in structure.  4. The tricuspid valve is normal in structure.  5. The aortic valve is normal in structure.  6. The aortic root and ascending aorta are normal in size and structure.  7. Right atrial pressure is estimated at 8 mmHg.  8. The interatrial septum was not assessed.   PHYSICAL EXAM General: obese elderly Caucasian lady who is intubated HEENT: Normocephalic atraumatic dry mucous membranes Lungs: Clear to auscultation Cardiovascular: S1-S2 heard regular  rate rhythm   Patient intubated. She is drowsy  and barely opens eyes She does not follow commands. She can visually track partially.Pupils are 3mm and equal and reactive  extraocular movement exam shows inability to gaze to the left.  She cannot cross midline to look to the left.  No visual field cuts based on confrontation.  Complete left lower facial paralysis.  Palate midline.  Tongue midline. Motor exam: Right upper extremity in cast and difficult to examine   left hemiplegia with 1-2/5 strength.  withdraws right lower extremityvalve purposefully against gravity. Sensory exam: grimaces to pain throughout . Coordination: Unable to perform   ASSESSMENT/PLAN [name] is a 73 y.o. 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 In hospital developed confusion, left-sided facial droop, left arm and leg weakness and numbness.  She was not a TPA candidate secondary to recent surgery.  She was taken to IR, with unsuccessful revascularization.  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 ASPECTS 10.     CTA head & neck no LVO.  Severe R M2 stenosis.  Mild bilateral ICA stenosis.  Moderate RVA stenosis.  Aortic atherosclerosis.  CT perfusion R parietal ischemic penumbra without core infarct  MRI  Large acute R MCA infarct w/ mod edema, no shift  2D Echo  EF 55-60%. No source of embolus   LDL 32 on [place]  HgbA1c 6.1  no VTE prophylaxis.  Lovenox 40 mg subcu daily added Diet Order            Diet NPO time specified  Diet effective now              No antithrombotic prior to admission, now  on No antithrombotic. Given large vessel intracranial atherosclerosis, patient should be treated with aspirin 81 mg and clopidogrel 75 mg orally every day x 3 months for secondary stroke prevention. After 3 months, change to plavix alone.   Therapy recommendations: CLR  Disposition:  pending   Acute respiratory failure  COPD  Intubated for attempted IR   CXR atx w/ prob small L effusion  Patient reintubated 02/04/19 due to inability to protect airway  PCCM following  Hypertension  Treated with cleviprex post IR, off  PTA: diltiazem 120 q 24h  Now on cardizem 30 q4  BP goal < 160 . 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 Renal US no hydro. Cr 0.93  COPD  Non union supracondylar humerus fracture, R - stable from surgeon standpoint  Respiratory failure due to inability to protect airway  Hospital day # 11 The patient has developed worsening respiratory failure due to inability to protect airway and got intubated last night. She will likely prolonged intubation and trach and peg which would not be advisable given his significant disability from large disabling stroke.I had a long discussion with the patient's daughter at the bedside and answered questions about her prognosis. She feels patient would not have wanted a tracheostomy PEG tube and nursing home carea decision making nd is struggling with decision making. I suggested palliative care consult to help her make the decision and she is agreeable  . This patient is critically ill and at significant risk of neurological worsening, death and care requires constant monitoring of vital  signs, hemodynamics,respiratory and cardiac monitoring, extensive review of multiple databases, frequent neurological assessment, discussion with family, other specialists and medical decision making of high complexity.I have made any additions or clarifications directly to the above note.This critical care time does not reflect procedure time, or teaching time or supervisory time of PA/NP/Med Resident etc but could involve care discussion time.discussed with Dr. Denese Killings,  I spent 30 minutes of neurocritical care time  in  the care of  this patient.        Delia Heady,  [facility] See Amion for Schedule & Pager information 02/13/2019 1:59 PM      To contact Stroke Continuity provider, please refer to [email or web address]. After hours, contact General Neurology

## 2019-02-05 NOTE — Progress Notes (Signed)
PT Cancellation Note  Patient Details Name: Angelica [name] MRN: [id number] DOB: 1946-11-01   Cancelled Treatment:    Reason Eval/Treat Not Completed: Medical issues which prohibited therapy(pt with medical decline and intubation not yet appropriate for therapy)   Angelica [name] B Sya Nestler 01/31/2019, 9:14 AM Angelica [name], PT Acute Rehabilitation Services Pager: [phone] Office: [phone]

## 2019-02-05 NOTE — Progress Notes (Signed)
SLP Cancellation Note  Patient Details Name: Angelica [name] MRN: [id number] DOB: 04/20/1946   Cancelled treatment:        Pt now intubated. Will follow   Royce Macadamia 02/03/2019, 8:02 AM   Breck Coons Lonell Face.Ed Nurse, children's [phone] Office [phone]

## 2019-02-05 NOTE — Progress Notes (Signed)
  Chaplain encountered patient's daughter, [name], in chapel crying. [name] is an only child. Her father died a few years ago.  [name] has sole responsibility for making heath decisions for her mother at this time and feels this burden.She is pondering palliative care due to the fact that she reports her mother is intubated and "not doing well." "It sounds like she may need to live with a breathing tube and feeding tube and I don't think she would want that."  She wonders if she should have a Palliative Care consult.  Chaplain offered ministry of presence and will continue to follow. [name] Pager [phone]

## 2019-02-05 NOTE — Consult Note (Signed)
Consultation Note Date: 01/28/2019   Patient Name: Angelica [name]  DOB: 03-01-1946  MRN: [id number]  Age / Sex: 73 y.o., female  PCP: Dione Housekeeper, MD Referring Physician: Kipp Brood, MD  Reason for Consultation: Establishing goals of care, Psychosocial/spiritual support, Terminal Care and Withdrawal of life-sustaining treatment  HPI/Patient Profile: 73 y.o. female  with past medical history of DM, CAD, COPD, HTN, HLD, and tobacco use admitted on 02/04/2019 for surgery on right upper extremity humeral fracture. On 2/11 patient had sudden onset of facial droop, dysarthria, and left hemiparesis. CTA of brain acute ischemic stoke. She underwent arteriogram for revascularization but experienced reocclusion. On 2/17, patient experienced mental status changes, was unable to protect airway,  and was intubated. PMT consulted for [place].   Clinical Assessment and Goals of Care: I have reviewed medical records including EPIC notes, labs and imaging, received report from RN and Dr. Lynetta Mare, assessed the patient and then met with patient's daughter and pastor to discuss diagnosis prognosis, [place], EOL wishes, disposition and options.  I introduced Palliative Medicine as specialized medical care for people living with serious illness. It focuses on providing relief from the symptoms and stress of a serious illness. The goal is to improve quality of life for both the patient and the family.  We discussed a brief life review of the patient. Daughter describes her as a social butterfly. She tells me patient has been living with her since October d/t unsteadiness.   As far as functional and nutritional status, patient requires assistance with ADLs d/t weakness. Her daughter and a CNA help with ADLs. Daughter tells me of falls and increased unsteadiness and weakness. Daughter also speaks of decreased appetite. No cognitive concerns at baseline - very sharp.   We  discussed her current illness and what it means in the larger context of her on-going co-morbidities.  Natural disease trajectory and expectations at EOL were discussed. Daughter has spoken with Dr. Lynetta Mare and Dr. Leonie Man and reports good understanding of clinical condition. We discussed stroke and her dependence on mechanical ventilation.   I attempted to elicit values and goals of care important to the patient.  Daughter shares that her mother would value quality of life over length of life. She does not think her mother would want trach/PEG placement as she would not consider this good quality of life.   The difference between aggressive medical intervention and comfort care was considered in light of the patient's goals of care. Daughter is leaning towards focusing on comfort - we discussed what a liberation from the ventilator could look like. She would like to speak to other family members about this before finalizing decision.   Advance directives, concepts specific to code status, artifical feeding and hydration, and rehospitalization were considered and discussed. Code status changed to DNR after discussion.   Questions and concerns were addressed.  The family was encouraged to call with questions or concerns.   Primary Decision Maker NEXT OF KIN - daughter Meleia    SUMMARY OF RECOMMENDATIONS   Code status changed to DNR Daughter moving towards terminal extubation - taking time to discuss with other family members PMT to follow up tomorrow  Code Status/Advance Care Planning:  DNR  Symptom Management:  Per CCM/neurology  Palliative Prophylaxis:   Aspiration, Bowel Regimen, Frequent Pain Assessment and Turn Reposition  Psycho-social/Spiri tual:   Desire for further Chaplaincy support:no  Additional Recommendations: Education on Hospice  Prognosis:   Unable to determine - depends on family decisions about  withdrawal of care  Discharge Planning: To Be Determined       Primary Diagnoses: Present on Admission: . 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 Anemia . HTN (hypertension) . COPD (chronic obstructive pulmonary disease) ([place]) . Hyperkalemia . Middle cerebral artery embolism, right   I have reviewed the medical record, interviewed the patient and family, and examined the patient. The following aspects are pertinent.  Past Medical History:  Diagnosis Date  . Arthritis   . Asthma   . COPD (chronic obstructive pulmonary disease) ([place])   . Coronary artery disease   . Diabetes mellitus   . Hypertension    Social History   Socioeconomic History  . Marital status: Married    Spouse name: Not on file  . Number of children: Not on file  . Years of education: Not on file  . Highest education level: Not on file  Occupational History  . Not on file  Social Needs  . Financial resource strain: Not on file  . Food insecurity:    Worry: Not on file    Inability: Not on file  . Transportation needs:    Medical: Not on file    Non-medical: Not on file  Tobacco Use  . Smoking status: Former Smoker    Packs/day: 1.00    Years: 30.00    Pack years: 30.00    Types: Cigarettes  . Smokeless tobacco: Never Used  Substance and Sexual Activity  . Alcohol use: No  . Drug use: No  . Sexual activity: Never  Lifestyle  . Physical activity:    Days per week: Not on file    Minutes per session: Not on file  . Stress: Not on file  Relationships  . Social connections:    Talks on phone: Not on file    Gets together: Not on file    Attends religious service: Not on file    Active member of club or organization: Not on file    Attends meetings of clubs or organizations: Not on file    Relationship status: Not on file  Other Topics Concern  . Not on file  Social History Narrative  . Not on file   Family History  Problem Relation Age of Onset  . Pneumonia Mother   . Lupus Mother   . Lung cancer Father    Scheduled Meds: .  stroke:  mapping our early stages of recovery book   Does not apply Once  . aspirin  81 mg Per Tube Daily  . bisoprolol  5 mg Per Tube Daily  . budesonide (PULMICORT) nebulizer solution  0.5 mg Nebulization BID  . chlorhexidine gluconate (MEDLINE KIT)  15 mL Mouth Rinse BID  . clopidogrel  75 mg Per Tube Daily  . diltiazem  60 mg Per Tube Q6H  . docusate  100 mg Per Tube BID  . DULoxetine  60 mg Oral Daily  . enoxaparin (LOVENOX) injection  40 mg Subcutaneous Q24H  . feeding supplement (PRO-STAT SUGAR FREE 64)  30 mL Per Tube BID  . insulin aspart  0-9 Units Subcutaneous Q4H  . insulin detemir  10 Units Subcutaneous BID  . ipratropium-albuterol  3 mL Nebulization TID  . lipase/protease/amylase)  20,880 Units Per Tube Once   And  . sodium bicarbonate  650 mg Per Tube Once  . mouth rinse  15 mL Mouth Rinse 10 times per day  . omega-3 acid ethyl esters  2 g Per Tube BID  . pantoprazole  sodium  40 mg Per Tube Daily  . vitamin C  1,000 mg Per Tube Daily   Continuous Infusions: . ampicillin-sulbactam (UNASYN) IV 3 g (02/10/2019 1534)  . feeding supplement (JEVITY 1.2 CAL) 1,000 mL (02/04/19 2303)  . fentaNYL infusion INTRAVENOUS Stopped (01/20/2019 0753)  . propofol (DIPRIVAN) infusion Stopped (01/28/2019 0753)   PRN Meds:.acetaminophen (TYLENOL) oral liquid 160 mg/5 mL, albuterol, alum & mag hydroxide-simeth, bisacodyl, fentaNYL, [DISCONTINUED] ondansetron **OR** ondansetron (ZOFRAN) IV, promethazine, sennosides Allergies  Allergen Reactions  . Bee Venom Anaphylaxis  . Levaquin [Levofloxacin] Anaphylaxis and Itching  . Amoxicillin-Pot Clavulanate Other (See Comments)    Upset GI Did it involve swelling of the face/tongue/throat, SOB, or low BP? No Did it involve sudden or severe rash/hives, skin peeling, or any reaction on the inside of your mouth or nose? No Did you need to seek medical attention at a hospital or doctor's office? No When did it last happen?10-20 years If all above answers  are "NO", may proceed with cephalosporin use.   Marland Kitchen Morphine And Related Rash  . Rocephin [Ceftriaxone Sodium In Dextrose] Rash and Other (See Comments)  . Latex Itching and Swelling  . Quinine Derivatives     Decreased platelet counts   Review of Systems  Unable to perform ROS: Intubated    Physical Exam Constitutional:      Interventions: She is intubated.  HENT:     Head: Normocephalic and atraumatic.  Cardiovascular:     Rate and Rhythm: Normal rate and regular rhythm.  Pulmonary:     Effort: Pulmonary effort is normal. She is intubated.  Abdominal:     Palpations: Abdomen is soft.  Musculoskeletal:     Right lower leg: Edema present.     Left lower leg: Edema present.  Skin:    General: Skin is warm and dry.  Neurological:     Mental Status: She is unresponsive.     Vital Signs: BP (!) 153/74   Pulse 86   Temp 99.3 F (37.4 C) (Axillary)   Resp (!) 24   Ht '5\' 1"'  (1.549 m)   Wt 90 kg   SpO2 100%   BMI 37.49 kg/m  Pain Scale: CPOT   Pain Score: Asleep   SpO2: SpO2: 100 % O2 Device:SpO2: 100 % O2 Flow Rate: .O2 Flow Rate (L/min): 3 L/min  IO: Intake/output summary:   Intake/Output Summary (Last 24 hours) at 01/28/2019 1600 Last data filed at 02/04/2019 1400 Gross per 24 hour  Intake 1241.46 ml  Output 950 ml  Net 291.46 ml    LBM: Last BM Date: 01/30/19 Baseline Weight: Weight: 86 kg Most recent weight: Weight: 90 kg     Palliative Assessment/Data: PPS 30%     Time Total: 70 minutes Greater than 50%  of this time was spent counseling and coordinating care related to the above assessment and plan.  Juel Burrow, DNP, AGNP-C Palliative Medicine Team [phone] Pager: [phone]

## 2019-02-05 NOTE — Progress Notes (Signed)
NAME:  Angelica [name], MRN:  [id number], DOB:  07-31-1946, LOS: 11 ADMISSION DATE:  02/09/2019, CONSULTATION DATE:  02/12/2019 REFERRING MD:  Wilford Corner CHIEF COMPLAINT:   Brief History   73 year old female past medical history of tobacco abuse, 30 pack years, no quit date, diabetes, coronary disease, COPD, hypertension and hyperlipidemia who recently underwent open reduction internal fixation of the right upper extremity humeral fracture, who developed sudden onset of left facial droop 2/11, dysarthria and left hemiparesis . CTA brain revealed scan positive for severe proximal right M2 stenosis. Right parietal lobe ischemic penumbra without core infarct evident on CTP. Mild bilateral intracranial ICA stenoses. She underwent RT common carotid arteriogram followed by re-vascularization of occluded branch of the RT MCA sup division achieving a TICI  3 revascularization with reocclusion due to underlying arteriosclerosis. PCCM have been consulted for vent management in patient with known history of COPD.  Past Medical History  HTN Asthma  COPD  Arthritis  DM  CAD   Significant Hospital Events   2/07 Admit for ORIF of R humeral fracture  2/11 Developed facial droop, R MCA embolism to IR  2/15 Tx to neurology floor  2/17 PCCM called back for change in mental status, labored breathing  - intubated  Consults:  PCCM 2/11   Procedures:  2/11 Right common carotid arteriogram followed by revascularization of occluded branch of the RT MCA sup division achieving a TICI  3 revascularization with reocclusion due to underlying arteriosclerosis.Marland Kitchen Rescue stenting not performed due the small diameter of the the involved branch,and potential increased risk of ICH with adjuvant dual antiplatelets  Significant Diagnostic Tests:  CT Angio Head 2/11 >> Negative for large vessel occlusion but positive for severe proximal right M2 stenosis. Right parietal lobe ischemic penumbra without core infarct evident on CTP. Mild  bilateral intracranial ICA stenoses. No cervical carotid artery stenosis. Moderate right vertebral artery origin stenosis. Aortic Atherosclerosis. Post Procedure Ct Brain  2/11 >> No ICH or mass effect CT head 2/18 on return to ICU >> evolving R MCA CVA without edema or mass effect. Micro Data:  MRSA PCR 2/11 >> Negative Urine Culture 2/10 >> negative  Antimicrobials:   none  Interim history/subjective:  Intubated yesterday for inability to protect airway. Daughter is reconsidering the decision to proceed with trach/PEG.  Objective   Blood pressure 95/69, pulse 76, temperature 99.3 F (37.4 C), temperature source Axillary, resp. rate (!) 23, height 5\' 1"  (1.549 m), weight 90 kg, SpO2 100 %.    Vent Mode: PRVC FiO2 (%):  [40 %-100 %] 40 % Set Rate:  [20 bmp] 20 bmp Vt Set:  [380 mL] 380 mL PEEP:  [5 cmH20] 5 cmH20 Pressure Support:  [10 cmH20] 10 cmH20 Plateau Pressure:  [14 cmH20-18 cmH20] 14 cmH20   Intake/Output Summary (Last 24 hours) at 02-11-19 1457 Last data filed at 02-11-2019 1200 Gross per 24 hour  Intake 1141.46 ml  Output 950 ml  Net 191.46 ml   Filed Weights   02/11/2019 1139 02/02/19 0500  Weight: 86 kg 90 kg   Physical Exam: General: ill appearing elderly female lying in bed HEENT: MM pink/moist, dried crusting on lips, small bore feeding tube in place, left sided facial droop  Neuro: dense left hemiparesis, eyes closed, spontaneous movement of RUE/RLE, no follow commands off sedation.   CV: s1s2 rrr, no m/r/g PULM: no ventilator dyssynchrony. Chest clear WS:FKCL, non-tender, bsx4 active  Extremities: warm/dry, trace generalized edema  Skin: no rashes or lesions  Resolved Hospital Problem list   Hypotension secondary to sedation Acute renal failure  Assessment & Plan:   Critically ill due to acute Respiratory Distress in setting of Encephalopathy & suspected ongoing micro Aspiration secondary to CVA Presumed COPD (no PFTs on file) P: Continue full  ventilatory support, no weaning for now pending decision regarding tracheostomy.  Large Right MCA Stroke s/p Revascularization of occluded branch of the RT MCA sup division P: Continue ASA, Plavix  Dysphagia  -failed swallow evaluation, Cortrak in place P: Tube feeds for now.  Hypertension P: Continue cardizem, bisoprolol PT   Iron Deficiency Anemia -s/p 1 U PRBC 2/11, no acute bleeding P: Trend CBC  Transfuse per ICU guidelines  NIDDM - uncontrolled P: SSI Q4 Add basal coverage.  Nonunion supracondylar humerus fracture, right upper extremity P: Per Ortho > soft cast in place  Monitor neurovascular checks of RUE  Best practice:  Diet: NPO  Pain/Anxiety/Delirium protocol (if indicated): N/A VAP protocol (if indicated): N/A DVT prophylaxis: SCD's hose  GI prophylaxis: Protonix Glucose control: CBG Q 4 with SSI Mobility: BR Code Status: Full Family Communication: Daughter, sisters x2 updated at bedside by Dr. Denese KillingsAgarwala.  Disposition: ICU  Labs   CBC: Recent Labs  Lab 01/30/19 0404  01/31/19 0403  02/01/19 0656 02/02/19 1122 02/03/19 1024 02/04/19 0621 02/04/19 2020 01/21/2019 0807  WBC 9.1  --  8.8  --  8.6 8.2 9.8 12.6*  --  12.8*  NEUTROABS 7.5  --  7.1  --   --   --   --   --   --  10.2*  HGB 7.9*   < > 7.7*   < > 8.0* 7.8* 8.0* 8.7* 9.2* 7.5*  HCT 25.4*   < > 24.6*   < > 25.4* 26.6* 26.2* 29.9* 27.0* 26.6*  MCV 97.7  --  96.9  --  97.7 100.4* 99.2 100.7*  --  103.9*  PLT 443*  --  453*  --  425* 542* 575* 620*  --  580*   < > = values in this interval not displayed.    Basic Metabolic Panel: Recent Labs  Lab 01/31/19 1650  02/01/19 0656 02/02/19 1122 02/02/19 1818 02/03/19 1024 02/04/19 0621 02/04/19 2020 02/14/2019 0807  NA  --    < > 145 146*  --  151* 154* 154* 154*  K  --    < > 3.7 3.4*  --  3.4* 3.4* 3.7 3.6  CL  --   --  110 109  --  113* 113*  --  111  CO2  --   --  22 23  --  28 33*  --  36*  GLUCOSE  --   --  102* 156*  --  207*  235*  --  355*  BUN  --   --  20 22  --  32* 43*  --  50*  CREATININE  --   --  0.90 0.96  --  0.72 0.83  --  0.92  CALCIUM  --   --  9.5 10.2  --  11.0* 10.9*  --  10.5*  MG 1.9  --  1.7 1.8 1.7 1.8 2.0  --   --   PHOS 3.3  --  3.0 2.6 2.3* 2.3*  --   --   --    < > = values in this interval not displayed.   GFR: Estimated Creatinine Clearance: 55.6 mL/min (by C-G formula based on SCr of 0.92 mg/dL). Recent  Labs  Lab 01/30/19 0404  02/02/19 1122 02/03/19 1024 02/04/19 0621 02/12/2019 0807  WBC 9.1   < > 8.2 9.8 12.6* 12.8*  LATICACIDVEN 0.6  --   --   --   --   --    < > = values in this interval not displayed.    Liver Function Tests: Recent Labs  Lab 02/04/19 0621  AST 18  ALT 17  ALKPHOS 53  BILITOT 1.0  PROT 6.9  ALBUMIN 2.8*   No results for input(s): LIPASE, AMYLASE in the last 168 hours. No results for input(s): AMMONIA in the last 168 hours.  ABG    Component Value Date/Time   PHART 7.398 02/04/2019 2020   PCO2ART 69.4 (HH) 02/04/2019 2020   PO2ART 411.0 (H) 02/04/2019 2020   HCO3 42.8 (H) 02/04/2019 2020   TCO2 45 (H) 02/04/2019 2020   ACIDBASEDEF 5.0 (H) 01/30/2019 0439   O2SAT 100.0 02/04/2019 2020     Coagulation Profile: No results for input(s): INR, PROTIME in the last 168 hours.  Cardiac Enzymes: No results for input(s): CKTOTAL, CKMB, CKMBINDEX, TROPONINI in the last 168 hours.  HbA1C: Hgb A1c MFr Bld  Date/Time Value Ref Range Status  01/30/2019 04:04 AM 6.1 (H) 4.8 - 5.6 % Final    Comment:    (NOTE) Pre diabetes:          5.7%-6.4% Diabetes:              >6.4% Glycemic control for   <7.0% adults with diabetes   01/19/2019 10:55 PM 6.4 (H) 4.8 - 5.6 % Final    Comment:    (NOTE) Pre diabetes:          5.7%-6.4% Diabetes:              >6.4% Glycemic control for   <7.0% adults with diabetes     CBG: Recent Labs  Lab 02/04/19 2009 02/04/19 2347 01/24/2019 0401 02/09/2019 0802 02/02/2019 1144  GLUCAP 248* 242* 350* 323* 316*     Critical care time: 35 min including chart data review, examination of patient, multidisciplinary rounds, and frequent assessment and modification of ventilator settings.  Lynnell Catalan, MD [facility] ICU Physician [facility]  Pager: [phone] Mobile: [phone] After hours: [phone].  02/11/2019, 2:58 PM

## 2019-02-06 ENCOUNTER — Encounter: Payer: Self-pay | Admitting: Family

## 2019-02-06 DIAGNOSIS — Z9911 Dependence on respirator [ventilator] status: Secondary | ICD-10-CM

## 2019-02-06 LAB — CBC WITH DIFFERENTIAL/PLATELET
Abs Immature Granulocytes: 0.42 10*3/uL — ABNORMAL HIGH (ref 0.00–0.07)
BASOS PCT: 0 %
Basophils Absolute: 0 10*3/uL (ref 0.0–0.1)
Eosinophils Absolute: 0 10*3/uL (ref 0.0–0.5)
Eosinophils Relative: 0 %
HCT: 25.2 % — ABNORMAL LOW (ref 36.0–46.0)
Hemoglobin: 7.1 g/dL — ABNORMAL LOW (ref 12.0–15.0)
Immature Granulocytes: 3 %
Lymphocytes Relative: 12 %
Lymphs Abs: 1.6 10*3/uL (ref 0.7–4.0)
MCH: 29.7 pg (ref 26.0–34.0)
MCHC: 28.2 g/dL — ABNORMAL LOW (ref 30.0–36.0)
MCV: 105.4 fL — ABNORMAL HIGH (ref 80.0–100.0)
MONO ABS: 1.3 10*3/uL — AB (ref 0.1–1.0)
Monocytes Relative: 9 %
NEUTROS PCT: 76 %
NRBC: 1.5 % — AB (ref 0.0–0.2)
Neutro Abs: 10.2 10*3/uL — ABNORMAL HIGH (ref 1.7–7.7)
Platelets: 531 10*3/uL — ABNORMAL HIGH (ref 150–400)
RBC: 2.39 MIL/uL — ABNORMAL LOW (ref 3.87–5.11)
RDW: 15 % (ref 11.5–15.5)
WBC: 13.6 10*3/uL — AB (ref 4.0–10.5)

## 2019-02-06 LAB — BASIC METABOLIC PANEL
Anion gap: 9 (ref 5–15)
BUN: 38 mg/dL — AB (ref 8–23)
CO2: 37 mmol/L — ABNORMAL HIGH (ref 22–32)
Calcium: 10.2 mg/dL (ref 8.9–10.3)
Chloride: 113 mmol/L — ABNORMAL HIGH (ref 98–111)
Creatinine, Ser: 0.8 mg/dL (ref 0.44–1.00)
GFR calc Af Amer: >60 mL/min (ref >60)
GFR calc non Af Amer: >60 mL/min (ref >60)
Glucose, Bld: 200 mg/dL — ABNORMAL HIGH (ref 70–99)
Potassium: 3.1 mmol/L — ABNORMAL LOW (ref 3.5–5.1)
Sodium: 159 mmol/L — ABNORMAL HIGH (ref 135–145)

## 2019-02-06 LAB — GLUCOSE, CAPILLARY
Glucose-Capillary: 259 mg/dL — ABNORMAL HIGH (ref 70–99)
Glucose-Capillary: 153 mg/dL — ABNORMAL HIGH (ref 70–99)

## 2019-02-06 MED ORDER — HALOPERIDOL LACTATE 5 MG/ML IJ SOLN: 0.5000 mg | INTRAMUSCULAR | Status: DC | PRN

## 2019-02-06 MED ORDER — MORPHINE BOLUS VIA INFUSION
2.0000 mg | INTRAVENOUS | Status: DC | PRN
  Administered 2019-02-06 (×3): 2 mg via INTRAVENOUS
  Filled 2019-02-06: qty 2

## 2019-02-06 MED ORDER — LORAZEPAM 2 MG/ML IJ SOLN
1.0000 mg | Freq: Once | INTRAMUSCULAR | Status: AC
  Administered 2019-02-06: 1 mg via INTRAVENOUS
  Filled 2019-02-06: qty 1

## 2019-02-06 MED ORDER — HALOPERIDOL 0.5 MG PO TABS
0.5000 mg | ORAL_TABLET | ORAL | Status: DC | PRN
  Filled 2019-02-06: qty 1

## 2019-02-06 MED ORDER — GLYCOPYRROLATE 1 MG PO TABS
1.0000 mg | ORAL_TABLET | ORAL | Status: DC | PRN
  Filled 2019-02-06: qty 1

## 2019-02-06 MED ORDER — GLYCOPYRROLATE 0.2 MG/ML IJ SOLN: 0.2000 mg | INTRAMUSCULAR | Status: DC | PRN

## 2019-02-06 MED ORDER — LORAZEPAM 2 MG/ML PO CONC: 1.0000 mg | ORAL | Status: DC | PRN

## 2019-02-06 MED ORDER — MORPHINE 100MG IN NS 100ML (1MG/ML) PREMIX INFUSION
2.0000 mg/h | INTRAVENOUS | Status: DC
  Administered 2019-02-06: 2 mg/h via INTRAVENOUS
  Filled 2019-02-06: qty 100

## 2019-02-06 MED ORDER — GLYCOPYRROLATE 0.2 MG/ML IJ SOLN
0.2000 mg | INTRAMUSCULAR | Status: DC | PRN
  Administered 2019-02-06: 0.2 mg via INTRAVENOUS
  Filled 2019-02-06: qty 1

## 2019-02-06 MED ORDER — MORPHINE BOLUS VIA INFUSION
4.0000 mg | INTRAVENOUS | Status: DC | PRN
  Administered 2019-02-06: 4 mg via INTRAVENOUS
  Filled 2019-02-06: qty 4

## 2019-02-06 MED ORDER — LORAZEPAM 1 MG PO TABS: 1.0000 mg | ORAL_TABLET | ORAL | Status: DC | PRN

## 2019-02-06 MED ORDER — LORAZEPAM 2 MG/ML IJ SOLN
1.0000 mg | INTRAMUSCULAR | Status: DC | PRN
  Administered 2019-02-06: 1 mg via INTRAVENOUS

## 2019-02-06 MED ORDER — HALOPERIDOL LACTATE 2 MG/ML PO CONC
0.5000 mg | ORAL | Status: DC | PRN
  Filled 2019-02-06: qty 0.3

## 2019-02-07 DIAGNOSIS — Z9911 Dependence on respirator [ventilator] status: Secondary | ICD-10-CM

## 2019-02-07 DIAGNOSIS — Z515 Encounter for palliative care: Secondary | ICD-10-CM

## 2019-02-12 ENCOUNTER — Telehealth: Payer: Self-pay | Admitting: *Deleted

## 2019-02-12 NOTE — Telephone Encounter (Signed)
Received original D/C from [facility] [place]-D/C Forwarded to Dr. Vassie Loll to sign.

## 2019-02-17 NOTE — Progress Notes (Signed)
Chaplain rec'd referral from unit. Chaplain had first met pt's daughter in [place].   Patient now transitioning to receive comfort care. Patient's only daughter and two friends bedside.  Pt's son-in-law and grandchildren, and [employer] pastor is on the way. Pt's daughter remembers end of life for her father about two years ago.  She does not want to see "her mother drown" like he did.  Daughter hopes for a comfortable transition.  Chaplain was asked to explain death to patient's grandchildren when they come. Will continue to follow. Tamsen Snider Pager [phone]

## 2019-02-17 NOTE — Progress Notes (Signed)
Nutrition Brief Note  Chart reviewed. Pt discussed during ICU rounds and with RN. Plan to transition to comfort care.  No further nutrition interventions warranted at this time.  Please re-consult as needed.   Kendell Bane RD, LDN, CNSC [phone] Pager [phone] After Hours Pager

## 2019-02-17 NOTE — Progress Notes (Signed)
OT Cancellation Note  Patient Details Name: Angelica [name] MRN: [id number] DOB: 04-30-1946   Cancelled Treatment:    Reason Eval/Treat Not Completed: Other (comment), per RN plan for comfort care.  Will sign off at this time, if further OT needs arise please re-consult.   Chancy Milroy, OT Acute Rehabilitation Services Pager [phone] Office [phone]    Chancy Milroy 02/03/2019, 8:38 AM

## 2019-02-17 NOTE — Progress Notes (Signed)
Pt extubated using withdrawal guidelines.  Pt made comfortable.  No distress noted.  RN @ bedside. 

## 2019-02-17 NOTE — Progress Notes (Signed)
Chaplain present for extubation. Patient made comfortable.  Offered brief words of scripture and prayers with family and friends bedside. Will continue to be available. [name] Pager [phone]

## 2019-02-17 NOTE — Progress Notes (Signed)
STROKE TEAM PROGRESS NOTE   INTERVAL HISTORY  .she  remains intubated and on ventilatory support. She is arousable but not following commands. She has purposeful right sided movement.  Her daughter is at the bedside and she has met with palliative care team and is leaning towards him for care and extubation later today  Vitals:   11-Feb-2019 0824 11-Feb-2019 0827 Feb 11, 2019 0830 2019-02-11 0930  BP: (!) 141/44  (!) 175/47 135/70  Pulse: 93  95 (!) 107  Resp: (!) 22  (!) 24 18  Temp:  99.2 F (37.3 C)    TempSrc:  Oral    SpO2: 93%  92% 99%  Weight:      Height:        CBC:  Recent Labs  Lab 02/02/2019 0807 2019/02/11 0648  WBC 12.8* 13.6*  NEUTROABS 10.2* 10.2*  HGB 7.5* 7.1*  HCT 26.6* 25.2*  MCV 103.9* 105.4*  PLT 580* 531*    Basic Metabolic Panel:  Recent Labs  Lab 02/02/19 1818 02/03/19 1024 02/04/19 0621  01/28/2019 0807 2019-02-11 0648  NA  --  151* 154*   < > 154* 159*  K  --  3.4* 3.4*   < > 3.6 3.1*  CL  --  113* 113*  --  111 113*  CO2  --  28 33*  --  36* 37*  GLUCOSE  --  207* 235*  --  355* 200*  BUN  --  32* 43*  --  50* 38*  CREATININE  --  0.72 0.83  --  0.92 0.80  CALCIUM  --  11.0* 10.9*  --  10.5* 10.2  MG 1.7 1.8 2.0  --   --   --   PHOS 2.3* 2.3*  --   --   --   --    < > = values in this interval not displayed.   Lipid Panel:     Component Value Date/Time   CHOL 108 01/30/2019 0404   TRIG 336 (H) 02/04/2019 2325   HDL 23 (L) 01/30/2019 0404   CHOLHDL 4.7 01/30/2019 0404   VLDL 53 (H) 01/30/2019 0404   LDLCALC 32 01/30/2019 0404   HgbA1c:  Lab Results  Component Value Date   HGBA1C 6.1 (H) 01/30/2019   Urine Drug Screen: No results found for: LABOPIA, COCAINSCRNUR, LABBENZ, AMPHETMU, THCU, LABBARB  Alcohol Level No results found for: [facility]  IMAGING Ct Head Wo Contrast  Result Date: 01/21/2019 CLINICAL DATA:  Altered mental status. Recent right MCA infarct. EXAM: CT HEAD WITHOUT CONTRAST TECHNIQUE: Contiguous axial images were obtained from the  base of the skull through the vertex without intravenous contrast. COMPARISON:  Brain MRI 01/30/2019 FINDINGS: Brain: There is a large well-defined region of cytotoxic edema corresponding to the acute MCA infarct shown on the prior MRI without evidence of interval infarct enlargement. This involves the insula, parietal lobe, and posterior frontal lobe including frontoparietal operculum. There is progressive sulcal effacement in this area but no midline shift, and there is at most trace petechial hemorrhage. A small infarct is also present in the right caudate nucleus, unchanged. No new infarct, or extra-axial fluid collection is identified. There is mild cerebral atrophy. Vascular: Calcified atherosclerosis at the skull base. No hyperdense vessel. Skull: No fracture or focal osseous lesion. Sinuses/Orbits: Visualized paranasal sinuses are clear. There are persistent bilateral mastoid effusions. Bilateral cataract extraction is noted. Other: None. IMPRESSION: Evolving large subacute right MCA infarct with at most trace petechial hemorrhage. No malignant hemorrhagic transformation,  infarct enlargement, or midline shift. Electronically Signed   By: Logan Bores M.D.   On: 02/12/2019 11:04   Dg Chest Port 1 View  Result Date: 02/04/2019 CLINICAL DATA:  73 year old female with a history of stroke EXAM: PORTABLE CHEST 1 VIEW COMPARISON:  02/04/2019 FINDINGS: Cardiomediastinal silhouette unchanged in size and contour. Low lung volumes with patchy opacities at the lung bases. No pneumothorax. Interval placement of right subclavian central venous catheter with the tip appearing to terminate superior vena cava/cavoatrial junction. Unchanged enteric tube which terminates out of the field of view. Interval placement of endotracheal tube, terminating 3.1 cm above the carina IMPRESSION: Interval placement of endotracheal tube terminating above the carina approximately 3.1 cm. Low lung volumes with likely basilar  atelectasis/consolidation. Interval placement of right subclavian central venous catheter without pneumothorax. Unchanged enteric tube Electronically Signed   By: Corrie Mckusick D.O.   On: 02/04/2019 19:06   2D Echocardiogram   1. The left ventricle has normal systolic function, with an ejection fraction of 55-60%. The cavity size was normal. Left ventricular diastolic Doppler parameters are consistent with impaired relaxation.  2. The right ventricle has normal systolic function. The cavity was normal. There is no increase in right ventricular wall thickness.  3. The mitral valve is normal in structure.  4. The tricuspid valve is normal in structure.  5. The aortic valve is normal in structure.  6. The aortic root and ascending aorta are normal in size and structure.  7. Right atrial pressure is estimated at 8 mmHg.  8. The interatrial septum was not assessed.   PHYSICAL EXAM General: obese elderly Caucasian lady who is intubated HEENT: Normocephalic atraumatic dry mucous membranes Lungs: Clear to auscultation Cardiovascular: S1-S2 heard regular rate rhythm   Patient intubated. She is awake She does not follow commands. She can visually track partially.Pupils are 73m and equal and reactive  extraocular movement exam shows inability to gaze to the left.  She cannot cross midline to look to the left.  No visual field cuts based on confrontation.  Complete left lower facial paralysis.  Palate midline.  Tongue midline. Motor exam: Right upper extremity in cast and difficult to examine   left hemiplegia with 1-2/5 strength.  withdraws right lower extremityvalve purposefully against gravity. Sensory exam: grimaces to pain throughout . Coordination: Unable to perform   ASSESSMENT/PLAN Ms. VBRITTANE GRUDZINSKIis a 73y.o. female with history of diabetes, coronary disease, COPD, HTN, HLD admitted 2/7 for treatment of her nonunion of the supracondylar humerus fracture on the right upper extremity  status post open reduction and internal fixation of supracondylar humerus fracture nonunion, being treated with antibiotics for leukocytosis. In hospital developed confusion, left-sided facial droop, left arm and leg weakness and numbness.  She was not a TPA candidate secondary to recent surgery.  She was taken to IR, with unsuccessful revascularization.  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 ASPECTS 10.     CTA head & neck no LVO.  Severe R M2 stenosis.  Mild bilateral ICA stenosis.  Moderate RVA stenosis.  Aortic atherosclerosis.  CT perfusion R parietal ischemic penumbra without core infarct  MRI  Large acute R MCA infarct w/ mod edema, no shift  2D Echo  EF 55-60%. No source of embolus   LDL 32 on Lov[place]  HgbA1c 6.1  no VTE prophylaxis.  Lovenox  40 mg subcu daily added Diet Order            Diet NPO time specified  Diet effective now              No antithrombotic prior to admission, now on No antithrombotic. Given large vessel intracranial atherosclerosis, patient should be treated with aspirin 81 mg and clopidogrel 75 mg orally every day x 3 months for secondary stroke prevention. After 3 months, change to plavix alone.   Therapy recommendations: CLR  Disposition:  pending   Acute respiratory failure  COPD  Intubated for attempted IR   CXR atx w/ prob small L effusion  Patient reintubated 02/04/19 due to inability to protect airway  PCCM following  Hypertension  Treated with cleviprex post IR, off  PTA: diltiazem 120 q 24h  Now on cardizem 30 q4  BP goal < 160 . 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 Renal US no hydro. Cr 0.93  COPD  Non union supracondylar humerus fracture, R - stable from surgeon standpoint  Respiratory failure due to inability to protect airway  Hospital day # 12 The patient has developed worsening respiratory failure due to inability to protect airway and got intubated last night. She will likely prolonged intubation and trach and peg which would not be advisable given his significant disability from large disabling stroke.I had a long discussion with the patient's daughter at the bedside and answered questions about her prognosis. She feels patient would not have wanted a tracheostomy PEG tube and nursing home and has met with palliative care team and has decided to do comfort care and extubation later today. . This patient is critically ill and at significant risk of neurological worsening, death and care requires constant monitoring of vital signs, hemodynamics,respiratory and cardiac monitoring, extensive review of multiple databases, frequent neurological assessment, discussion with family, other specialists and medical decision making of high complexity.I have made any additions or clarifications directly to the above note.This critical care time does not reflect procedure time, or teaching time or supervisory time of PA/NP/Med Resident etc but could involve care discussion time.discussed with Dr. Lynetta Mare,  I spent 30 minutes of neurocritical care time  in the care of  this patient.        Antony Contras,  [place] for Schedule & Pager information 12-Feb-2019 3:20 PM      To contact Stroke Continuity provider, please refer to [email or web address] After hours, contact General Neurology

## 2019-02-17 NOTE — Progress Notes (Signed)
Wasted fentanyl in sink w/ Docia Barrier, RN.

## 2019-02-17 NOTE — Progress Notes (Signed)
Daily Progress Note   Patient Name: Angelica [name]       Date: Feb 09, 2019 DOB: 10/07/1946  Age: 73 y.o. MRN#: [id number] Attending Physician: Kipp Brood, MD Primary Care Physician: Dione Housekeeper, MD Admit Date: 02/14/2019  Reason for Consultation/Follow-up: Non pain symptom management, Pain control, Psychosocial/spiritual support, Terminal Care and Withdrawal of life-sustaining treatment  Subjective: Patient unresponsive, daughter at bedside  Length of Stay: 12  Current Medications: Scheduled Meds:  . chlorhexidine gluconate (MEDLINE KIT)  15 mL Mouth Rinse BID  . mouth rinse  15 mL Mouth Rinse 10 times per day    Continuous Infusions: . morphine 2 mg/hr (2019-02-09 1248)    PRN Meds: acetaminophen (TYLENOL) oral liquid 160 mg/5 mL, bisacodyl, glycopyrrolate **OR** glycopyrrolate **OR** glycopyrrolate, haloperidol **OR** haloperidol **OR** haloperidol lactate, LORazepam **OR** LORazepam **OR** LORazepam, morphine, [DISCONTINUED] ondansetron **OR** ondansetron (ZOFRAN) IV  Physical Exam Constitutional:      General: She is not in acute distress.    Appearance: She is ill-appearing.     Interventions: She is intubated.  Cardiovascular:     Rate and Rhythm: Normal rate and regular rhythm.  Pulmonary:     Effort: She is intubated.  Abdominal:     Palpations: Abdomen is soft.  Skin:    General: Skin is warm and dry.  Neurological:     Mental Status: She is unresponsive.             Vital Signs: BP 135/70   Pulse (!) 107   Temp 99.2 F (37.3 C) (Oral)   Resp 18   Ht '5\' 1"'  (1.549 m)   Wt 90 kg   SpO2 99%   BMI 37.49 kg/m  SpO2: SpO2: 99 % O2 Device: O2 Device: Ventilator O2 Flow Rate: O2 Flow Rate (L/min): 3 L/min  Intake/output summary:   Intake/Output Summary (Last 24  hours) at February 09, 2019 1319 Last data filed at 2019/02/09 0900 Gross per 24 hour  Intake 3062.42 ml  Output 1200 ml  Net 1862.42 ml   LBM: Last BM Date: 01/30/19 Baseline Weight: Weight: 86 kg Most recent weight: Weight: 90 kg       Palliative Assessment/Data: PPS 10%    Flowsheet Rows     Most Recent Value  Intake Tab  Referral Department  Neurology  Unit at Time of Referral  ICU  Palliative Care Primary Diagnosis  Neurology  Date Notified  01/28/2019  Palliative Care Type  New Palliative care  Reason for referral  Clarify Goals of Care  Date of Admission  01/28/2019  Date first seen by Palliative Care  02/01/2019  # of days Palliative referral response time  0 Day(s)  # of days IP prior to Palliative referral  11  Clinical Assessment  Palliative Performance Scale Score  30%  Psychosocial & Spiritual Assessment  Palliative Care Outcomes  Patient/Family meeting held?  Yes  Who was at the meeting?  daughter and pastor  Palliative Care Outcomes  Clarified goals of care, Counseled regarding hospice, Provided end of life care assistance, Provided psychosocial or spiritual support, Changed CPR status      Patient Active Problem List   Diagnosis Date Noted  . Acute embolic stroke (Angelica [name])   .  Acute respiratory failure ([place])   . Goals of care, counseling/discussion   . Palliative care by specialist   . Middle cerebral artery embolism, right 01/22/2019  . AKI (acute kidney injury) ([place])   . Leukocytosis   . Hyperkalemia 01/26/2019  . 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 IDA (iron deficiency anemia) 10/07/2017  . Respiratory failure with hypoxia ([place]) 03/18/2012  . Healthcare-associated pneumonia 03/18/2012  . COPD (chronic obstructive pulmonary disease) ([name]) 03/18/2012  . COPD exacerbation ([place]) 03/02/2012  . Hyponatremia 03/02/2012  . Anemia 03/02/2012  . HTN (hypertension) 03/02/2012  . DM type 2 (diabetes mellitus, type 2) ([place]) 03/02/2012     Palliative Care Assessment & Plan   HPI: 73 y.o. female  with past medical history of DM, CAD, COPD, HTN, HLD, and tobacco use admitted on 02/02/2019 for surgery on right upper extremity humeral fracture. On 2/11 patient had sudden onset of facial droop, dysarthria, and left hemiparesis. CTA of brain acute ischemic stoke. She underwent arteriogram for revascularization but experienced reocclusion. On 2/17, patient experienced mental status changes, was unable to protect airway,  and was intubated. PMT consulted for Angelica [name].   Assessment: Follow up with daughter today. She tells me she was able to speak with other family members about our [place] conversation yesterday and has decided to move forward with comfort care/liberation from ventilator. We discussed process and discussed aggressive symptom management. Her main concern was that her mother not suffer during the process. We discussed that prognosis was anywhere from minutes to days after extubation.   Daughter stepped out of the room for extubation. Morphine and ativan utilized for symptom management. Once patient was extubated and appeared comfortable, patient's daughter, grandchildren and other family members returned to bedside with chaplain. They agreed that patient appeared comfortable.   Emotional support provided. Family encouraged to call with questions or concerns.  Recommendations/Plan:  Comfort care - extubation  Morphine infusion, boluses as needed, prn ativan, haldol, and robinul  Anticipate hospital death - hours  Goals of Care and Additional Recommendations:  Limitations on Scope of Treatment: Full Comfort Care  Code Status:  DNR  Prognosis:   Hours - Days  Discharge Planning:  Anticipated Hospital Death  Care plan was discussed with Patient's family, RN, Dr. Lynetta Mare  Thank you for allowing the Palliative Medicine Team to assist in the care of this patient.   Time In: 1100 Time Out: 1300 Total Time 120  minutes Prolonged Time Billed  yes       Greater than 50%  of this time was spent counseling and coordinating care related to the above assessment and plan.  Juel Burrow, DNP, [facility] Palliative Medicine Team Team Phone # [phone]  Pager [phone]

## 2019-02-17 NOTE — Discharge Summary (Signed)
Patient ID: Angelica [name] MRN: [id number] DOB/AGE: Feb 24, 1946 73 y.o.  Admit date: February 11, 2019 Death date: February 23, 2019 at 1950 hrs.  Admission Diagnoses: Right humerus fracture  Cause of Death: Large right middle cerebral artery tree infarct with cytotoxic edema and brain herniation with respiratory failure.  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  She is also being treated with antibiotics for leukocytosis. She had some confusion starting yesterday which was attributed to her rising BUN and creatinine.  She has been given fluids.  Morning labs not available at this time. Her daughter noted that around 6:15 AM she was in her usual state of health and shortly after she started noticing a left-sided facial droop, left upper and lower extremity weakness and numbness on the left side. A code stroke was called on the floor.  Patient was assessed on the floor in 5 N. She had difficulty with her gaze this with inability to look all the way to the left.  Her left eye did not cross the midline her right eye cross the midline slightly to  the left.  Gaze to the right was normal.  She had left lower facial weakness, she had severe dysarthria and left upper and lower extremity weakness along with left-sided sensory loss. Due to the recent surgery 2 days ago, she was deemed to be not a candidate for IV TPA.  Premorbid modified Rankin scale score was 1.  Patient was taken for emergent diagnostic cerebral angiogram with intent for possible revascularization and was found to have occluded superior division of the right middle cerebral artery which was revascularized with 1 pass of amber trap device resulting in TICI 2b revascularization but with subsequent reocclusion likely related to underlying atherosclerosis.  MRI scan of the brain showed a large right middle cerebral artery infarct in posterior division with moderate associated cytotoxic edema.  Patient was kept in the intensive care unit and on ventilatory support.  Her neurological condition did not improve.  It was apparent that the patient required prolonged ventilatory support, tracheostomy and PEG tube.  The patient's family felt she would not have wanted to live a life of disability and go to a nursing home which was unavoidable.  After multiple discussions family agreed to DNR and comfort care and ventilatory support was withdrawn.  Patient was kept comfortable and her condition gradually declined and she passed away peacefully.  Signed: Delia Heady 02/14/2019, 4:10 PM

## 2019-02-17 NOTE — Progress Notes (Signed)
Death pronounced 1950. Confirmed with Weston Brass RN, no apical HR heard, no resp effort.

## 2019-02-17 DEATH — deceased

## 2019-02-18 ENCOUNTER — Telehealth: Payer: Self-pay

## 2019-02-18 NOTE — Telephone Encounter (Signed)
Received signed dc from Doctor Agarwala. I called funeral home to let them know dc was ready for pickup.

## 2020-03-12 IMAGING — XA IR  CT HEAD LIMITED
1 series · 13 of 24 positions shown · non-contrast
Comparison: CT angiogram of the head and neck of 01/29/2019.

INDICATION: Acute onset of left-sided weakness with dysarthria, and right gaze
deviation. CT angiogram of the head and neck revealing occlusion of
prominent branch of superior division of the right middle cerebral
artery.

EXAM:
1. EMERGENT LARGE VESSEL OCCLUSION THROMBOLYSIS (anterior
CIRCULATION)
TECHNIQUE: Following a full explanation of the procedure along with the
potential associated complications, an informed witnessed consent
was obtained from the patient's daughter. The risks of intracranial
hemorrhage of 10%, worsening neurological deficit, ventilator
dependency, death and inability to revascularize were all reviewed
in detail with the patient's daughter.

[Series 15: axial · axial · 0.5mm · 0.39mm/px · z∈[-120,+32]mm · 13 of 31 slices shown]
[im 1/31]
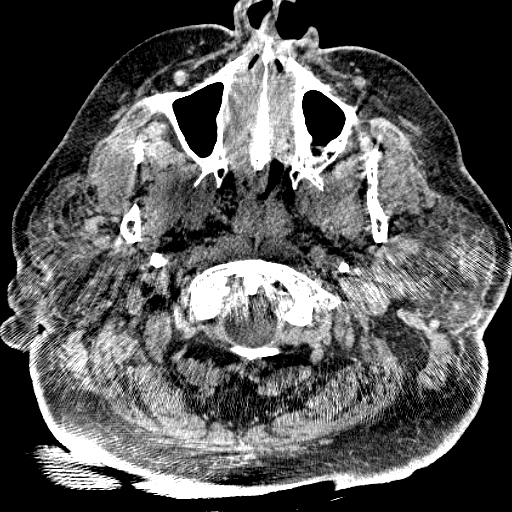
[im 3/31]
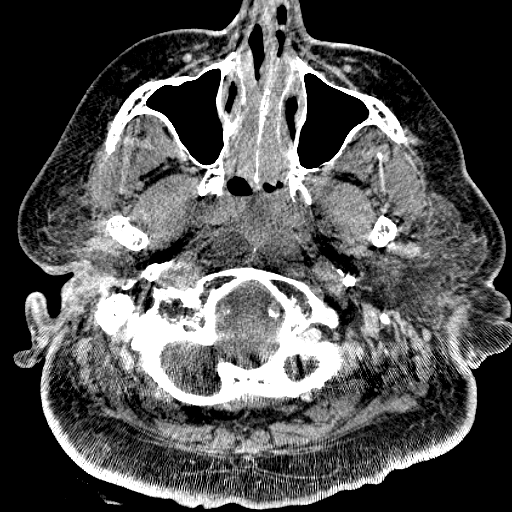
[im 6/31]
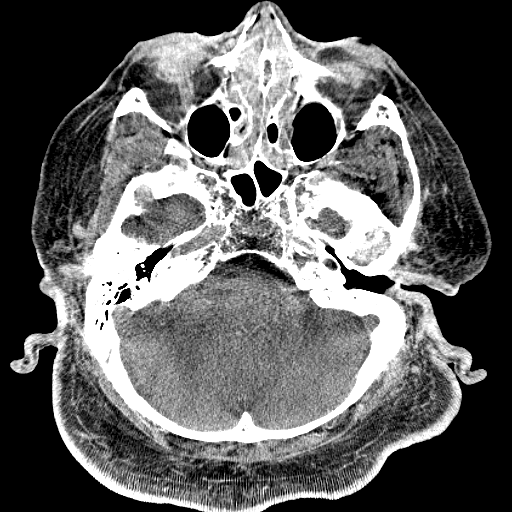
[im 8/31]
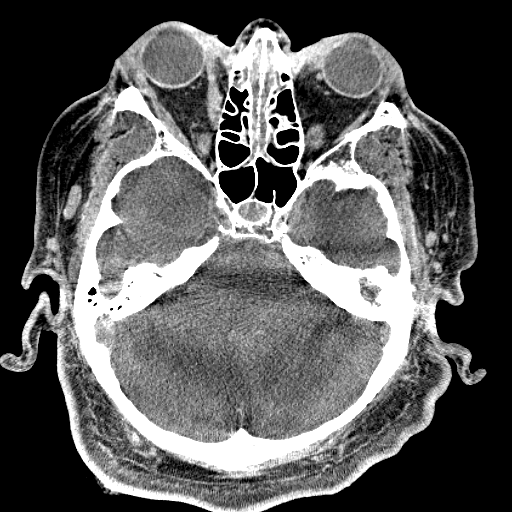
[im 11/31]
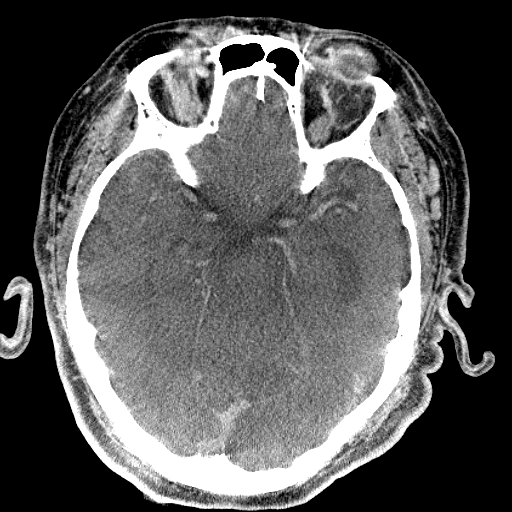
[im 14/31]
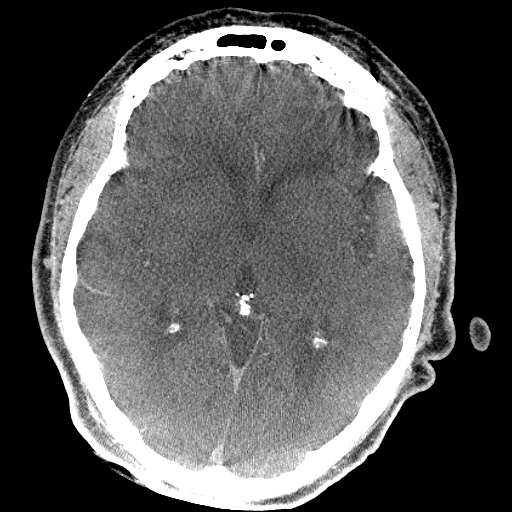
[im 16/31]
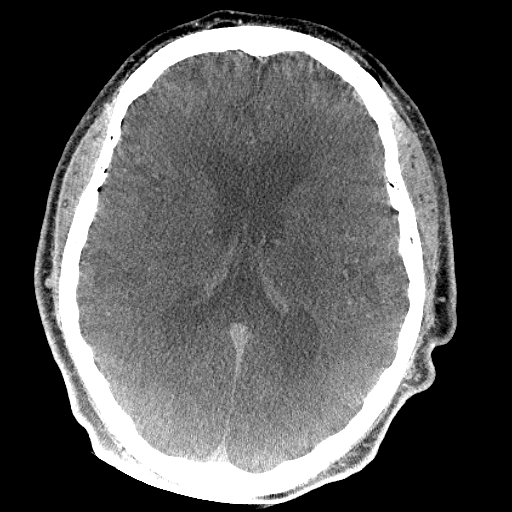
[im 17/31]
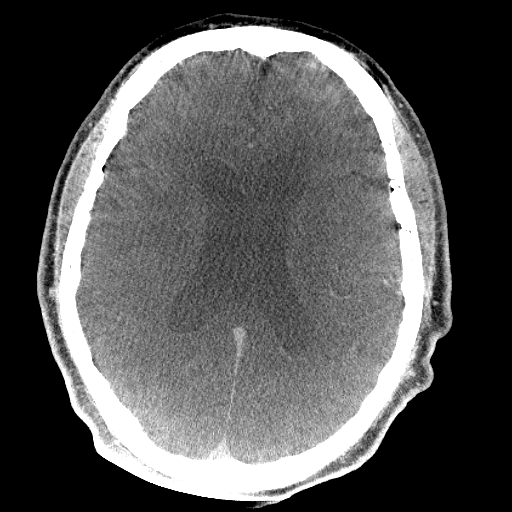
[im 20/31]
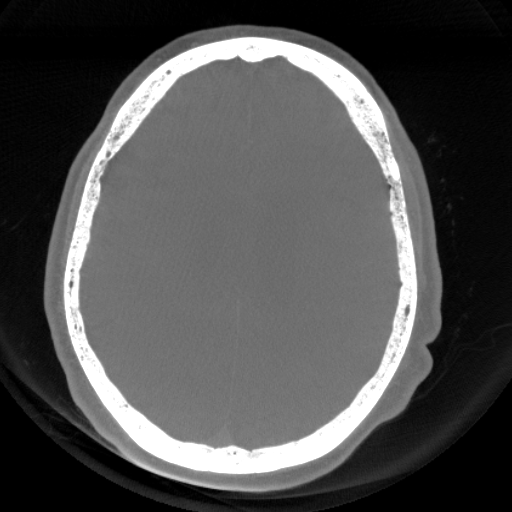
[im 23/31]
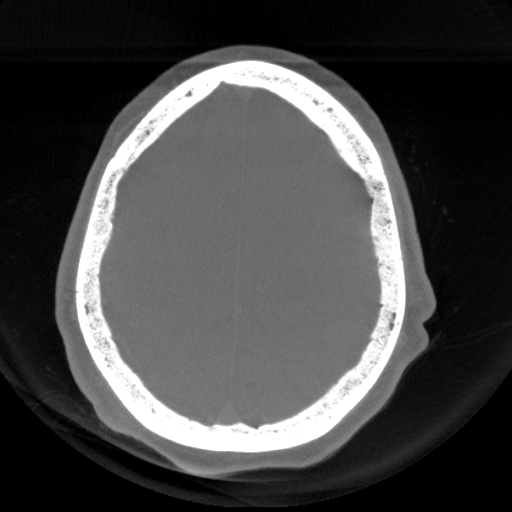
[im 25/31]
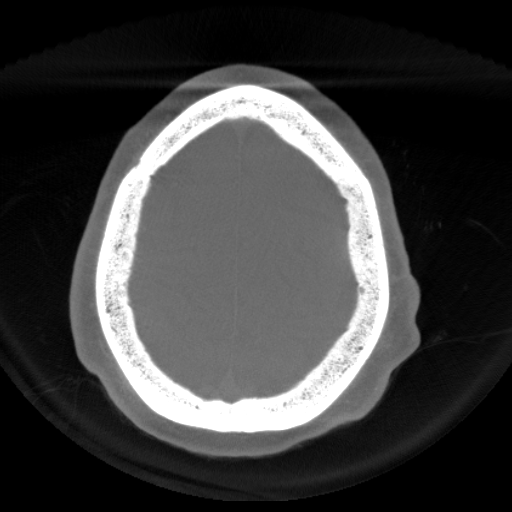
[im 28/31]
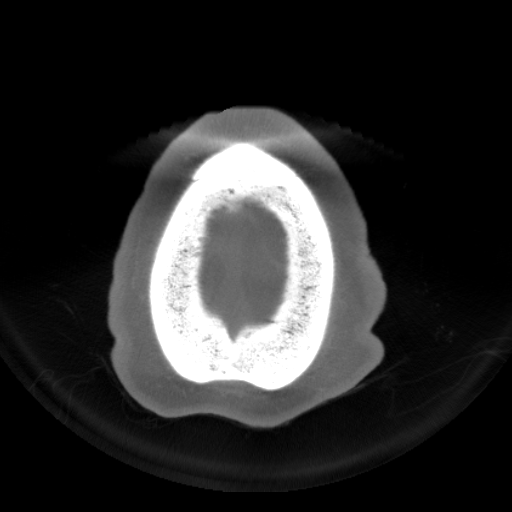
[im 31/31]
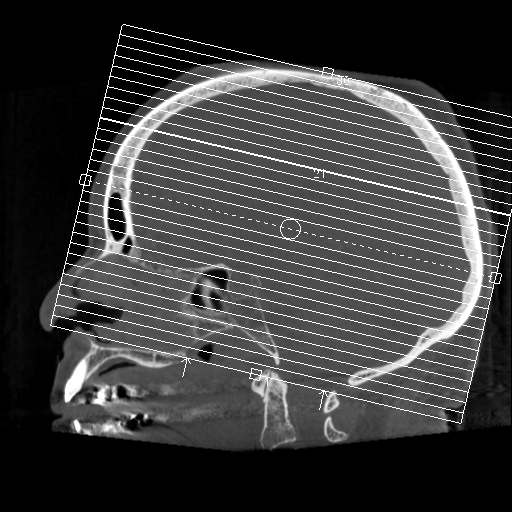

[13 of 24 positions shown; findings below may reference images not displayed]

MEDICATIONS:
Vancomycin 1 g IV antibiotic was administered within 1 hour of the
procedure.

ANESTHESIA/SEDATION:
General anesthesia.

CONTRAST:  Isovue 300 approximately 75 mL.

FLUOROSCOPY TIME:  Fluoroscopy Time: 34 minutes 43 seconds (6888
mGy).

COMPLICATIONS:
None immediate.
The patient was then put under general anesthesia by the [REDACTED] at [HOSPITAL].

The right groin was prepped and draped in the usual sterile fashion.
Thereafter using modified Seldinger technique, transfemoral access
into the right common femoral artery was obtained without
difficulty. Over a 0.035 inch guidewire a 5 French Pinnacle sheath
was inserted. Through this, and also over a 0.035 inch guidewire a 5
French JB 1 catheter was advanced to the aortic arch region and
selectively positioned in the innominate artery and the right common
carotid artery.
FINDINGS: The innominate artery injection demonstrates the origin of the right
subclavian artery and the right common carotid artery to be mildly
narrowed and widely patent.

Marked tortuosity is seen of the right subclavian artery in its
proximal segment to the right vertebral artery.

The right vertebral artery origin is mildly narrowed. The vessel
meanders to the cranial skull base to opacify the right
vertebrobasilar junction at the level of the occipital region.

A right common carotid arteriogram demonstrates the right external
carotid artery and its major branches to be widely patent.

The right internal carotid artery at the bulb is widely patent.
There is a U shaped configuration of the proximal [DATE] of the right
internal carotid artery without evidence of kinking. Distal to this
the vessel is seen to opacify to the cranial skull base. The petrous
segment is widely patent.

There is mild stenosis at the origin of the petrous cavernous
junction and also in the caval cavernous segment. The distal
cavernous and the supraclinoid segments are widely patent.

The right posterior communicating artery is seen opacifying the
right posterior cerebral artery distribution.

The right middle cerebral artery M1 segment is widely patent.

There is a truncation of a prominent branch of the superior division
of the right middle cerebral artery. The inferior division is widely
patent.

The right anterior cerebral artery opacifies into the capillary and
venous phases. Cross-filling transiently of the left anterior
cerebral artery via the anterior communicating artery is noted.

The lateral delayed arterial and capillary phases demonstrate a
substantial area of anemia involving the posterior [DATE] of the corpus
callosum, and the subcortical white matter involving the superior
temporal and temporoparietal regions.

PROCEDURE:
The diagnostic JB 1 catheter in the right common carotid artery was
exchanged over a 0.035 inch 300 cm Rosen exchange guidewire for an 8
French 55 cm Brite tip neurovascular sheath using biplane roadmap
technique and constant fluoroscopic guidance. Good aspiration was
obtained from the side port of the neurovascular sheath. This was
connected to continuous heparinized saline infusion. Over the Rosen
exchange guidewire, an 8 French 85 cm FlowGate balloon guide
catheter which had been prepped with 50% contrast and 50%
heparinized saline infusion was advanced and positioned at the
origin of the right internal carotid artery. The guidewire was
removed. Good aspiration was obtained from the hub of the FlowGate
guide catheter. A control arteriogram performed demonstrated no
evidence of spasms, dissections or of intraluminal filling defects.
The tip of the FlowGate guide catheter was advanced to just proximal
to the initial sharp turn laterally of the U configuration.

An intracranial arteriogram demonstrated no change in the occluded
dominant branch of the superior division of the right middle
cerebral artery.

At this time, a combination of a Catalyst 6 French 132 cm
intermediary catheter inside of which was an 021 Trevo ProVue
microcatheter was advanced over a 0.014 inch Softip Synchro micro
guidewire to the supraclinoid right ICA. With the micro guidewire
leading with a J-tip configuration, the combination was navigated to
the supraclinoid right ICA. The micro guidewire was then manipulated
using a torque device and advanced without difficulty through the
occluded right middle cerebral artery superior division branch into
the M2 M3 regions followed by the microcatheter. The guidewire was
removed. Good aspiration obtained from the hub of the microcatheter.
This was then connected to continuous heparinized saline infusion.

At this time, a 5 mm x 33 mm Embotrap retrieval device was advanced
to the distal end of the microcatheter. The proximal and the distal
landing zones were then defined. The O ring the delivery
microcatheter was loosened. With slight forward gentle traction with
the right hand on the delivery micro guidewire, with the left hand
the microcatheter was retrieved unsheathing the proximal and the
distal portion of the retrieval device.

A control arteriogram performed through the 6 French Catalyst guide
catheter in the supraclinoid right ICA demonstrated a TICI 2b
revascularization.

With proximal flow arrest initiated by inflating the balloon of the
FlowGate guide catheter in the proximal right internal carotid
artery, the combination of the retrieval device, the microcatheter,
and the Catalyst guide catheter were retrieved and removed as
constant aspiration was applied with a 60 mL syringe at the hub of
the FlowGate guide catheter, and with the Penumbra suction
aspiration device at the hub of the intermediary guide catheter.

The combination was retrieved and removed. Aspiration at the hub of
the FlowGate guide catheter was continued as flow arrest was
reversed with deflation.

No significant amount of clot was noted within the aspirate or in
the retrieval device.

A control arteriogram performed through the 8 French FlowGate guide
catheter in the right internal carotid artery proximally
demonstrated a revascularization of the revascularized occluded
superior division branch.

Irregularity of caliber was noted along the proximal portion of this
vessel. The patient was given 4 aliquots of 25 mcg of nitroglycerin
via the FlowGate guide catheter in order to ascertain whether this
was related to vasospasm.

However, progressive arteriograms at 5 and 10 minutes demonstrated
progressive worsening of the caliber of the vessel, with eventual
occlusion.

Options considered at that time were those of rescue stenting versus
further attempts at thrombectomy.

It was felt the occlusion was primarily related to underlying
intracranial arteriosclerosis. Placement of a rescue stent with
increased risk of a potential rupture of the vessel in view of the
extreme smallest of the diameter. Also the patient would require
dual antiplatelets to protect the stent with increased potential
risk of a hemorrhagic complication given the fact the patient had
also had recent surgery to her shoulder 2 days prior.

This was discussed with the referring neurologist. It was agreed to
continue with aggressive medical management with raising of the
blood pressure to allow more collateralization. Further endovascular
treatment would be potentially more risky.

The FlowGate guide catheter, and the neurovascular sheath were then
retrieved into the abdominal aorta and exchanged over a J-tip
guidewire for an 8 French Pinnacle sheath. This in turn was
connected to continuous heparinized saline infusion and left in
situ. Distal pulses remained Dopplerable in the dorsalis pedis, and
posterior tibial regions.

Throughout the procedure, the patient's hemodynamic and neurological
status remained stable.

An immediate CT of the brain performed revealed no evidence of
intracranial hemorrhage, mass effect or midline shift. The
ventricles remained at the upper limits of normal unchanged from the
earlier CT scan of the brain.

The patient was left intubated on account of her underlying COPD.

The right groin appeared soft without evidence of hematoma. Patient
was then transferred to the neuro ICU to continue with further post
ischemic management.
IMPRESSION: Status post endovascular revascularization of occluded dominant
branch of the superior division of the right middle cerebral artery
with 1 pass with the Embotrap 5 mm x 33 mm retrieval device
achieving a TICI 2b revascularization with subsequent reocclusion
most likely secondary to underlying arteriosclerosis.

PLAN:
Follow-up with the referring physician.
